# Patient Record
Sex: Female | Born: 1935 | Race: Black or African American | Hispanic: No | Smoking: Never smoker
Health system: Southern US, Community
[De-identification: ages and names within clinical notes are randomized; demographics above are authoritative.]

## PROBLEM LIST (undated history)

## (undated) DIAGNOSIS — M810 Age-related osteoporosis without current pathological fracture: Secondary | ICD-10-CM

## (undated) DIAGNOSIS — E785 Hyperlipidemia, unspecified: Secondary | ICD-10-CM

## (undated) DIAGNOSIS — K219 Gastro-esophageal reflux disease without esophagitis: Secondary | ICD-10-CM

## (undated) DIAGNOSIS — I509 Heart failure, unspecified: Secondary | ICD-10-CM

## (undated) DIAGNOSIS — I10 Essential (primary) hypertension: Secondary | ICD-10-CM

## (undated) DIAGNOSIS — I429 Cardiomyopathy, unspecified: Secondary | ICD-10-CM

## (undated) DIAGNOSIS — E1140 Type 2 diabetes mellitus with diabetic neuropathy, unspecified: Secondary | ICD-10-CM

## (undated) DIAGNOSIS — N189 Chronic kidney disease, unspecified: Secondary | ICD-10-CM

## (undated) DIAGNOSIS — E119 Type 2 diabetes mellitus without complications: Secondary | ICD-10-CM

## (undated) HISTORY — DX: Age-related osteoporosis without current pathological fracture: M81.0

## (undated) HISTORY — DX: Type 2 diabetes mellitus without complications: E11.9

## (undated) HISTORY — PX: EYE SURGERY: SHX253

## (undated) HISTORY — DX: Chronic kidney disease, unspecified: N18.9

## (undated) HISTORY — DX: Gastro-esophageal reflux disease without esophagitis: K21.9

## (undated) HISTORY — PX: BREAST LUMPECTOMY: SHX2

## (undated) HISTORY — DX: Cardiomyopathy, unspecified: I42.9

## (undated) HISTORY — DX: Essential (primary) hypertension: I10

## (undated) HISTORY — DX: Hyperlipidemia, unspecified: E78.5

## (undated) HISTORY — DX: Heart failure, unspecified: I50.9

## (undated) HISTORY — DX: Type 2 diabetes mellitus with diabetic neuropathy, unspecified: E11.40

---

## 1957-08-05 HISTORY — PX: NEPHRECTOMY: SHX65

## 1982-08-05 HISTORY — PX: BLADDER SURGERY: SHX569

## 2002-08-05 HISTORY — PX: COLOSTOMY: SHX63

## 2003-07-12 ENCOUNTER — Inpatient Hospital Stay (HOSPITAL_COMMUNITY): Admission: EM | Admit: 2003-07-12 | Discharge: 2003-07-20 | Payer: Self-pay | Admitting: Emergency Medicine

## 2003-07-13 ENCOUNTER — Encounter (INDEPENDENT_AMBULATORY_CARE_PROVIDER_SITE_OTHER): Payer: Self-pay

## 2003-10-25 ENCOUNTER — Encounter: Admission: RE | Admit: 2003-10-25 | Discharge: 2003-10-25 | Payer: Self-pay | Admitting: General Surgery

## 2003-11-10 ENCOUNTER — Inpatient Hospital Stay (HOSPITAL_COMMUNITY): Admission: RE | Admit: 2003-11-10 | Discharge: 2003-11-21 | Payer: Self-pay | Admitting: General Surgery

## 2003-11-10 ENCOUNTER — Encounter (INDEPENDENT_AMBULATORY_CARE_PROVIDER_SITE_OTHER): Payer: Self-pay | Admitting: Specialist

## 2004-03-08 ENCOUNTER — Other Ambulatory Visit: Admission: RE | Admit: 2004-03-08 | Discharge: 2004-03-08 | Payer: Self-pay | Admitting: Gynecology

## 2004-05-11 ENCOUNTER — Encounter (INDEPENDENT_AMBULATORY_CARE_PROVIDER_SITE_OTHER): Payer: Self-pay | Admitting: Diagnostic Radiology

## 2004-05-11 ENCOUNTER — Encounter: Admission: RE | Admit: 2004-05-11 | Discharge: 2004-05-11 | Payer: Self-pay | Admitting: Internal Medicine

## 2004-05-11 ENCOUNTER — Encounter (INDEPENDENT_AMBULATORY_CARE_PROVIDER_SITE_OTHER): Payer: Self-pay | Admitting: *Deleted

## 2004-05-11 ENCOUNTER — Encounter: Admission: RE | Admit: 2004-05-11 | Discharge: 2004-05-11 | Payer: Self-pay | Admitting: General Surgery

## 2004-05-16 ENCOUNTER — Encounter: Admission: RE | Admit: 2004-05-16 | Discharge: 2004-05-16 | Payer: Self-pay | Admitting: General Surgery

## 2004-05-31 ENCOUNTER — Encounter (INDEPENDENT_AMBULATORY_CARE_PROVIDER_SITE_OTHER): Payer: Self-pay | Admitting: *Deleted

## 2004-05-31 ENCOUNTER — Ambulatory Visit (HOSPITAL_COMMUNITY): Admission: RE | Admit: 2004-05-31 | Discharge: 2004-05-31 | Payer: Self-pay | Admitting: General Surgery

## 2004-05-31 ENCOUNTER — Encounter: Admission: RE | Admit: 2004-05-31 | Discharge: 2004-05-31 | Payer: Self-pay | Admitting: General Surgery

## 2004-05-31 ENCOUNTER — Ambulatory Visit (HOSPITAL_BASED_OUTPATIENT_CLINIC_OR_DEPARTMENT_OTHER): Admission: RE | Admit: 2004-05-31 | Discharge: 2004-05-31 | Payer: Self-pay | Admitting: General Surgery

## 2006-06-06 ENCOUNTER — Ambulatory Visit (HOSPITAL_BASED_OUTPATIENT_CLINIC_OR_DEPARTMENT_OTHER): Admission: RE | Admit: 2006-06-06 | Discharge: 2006-06-06 | Payer: Self-pay | Admitting: Urology

## 2006-11-18 ENCOUNTER — Ambulatory Visit (HOSPITAL_BASED_OUTPATIENT_CLINIC_OR_DEPARTMENT_OTHER): Admission: RE | Admit: 2006-11-18 | Discharge: 2006-11-18 | Payer: Self-pay | Admitting: Urology

## 2007-04-16 ENCOUNTER — Ambulatory Visit (HOSPITAL_BASED_OUTPATIENT_CLINIC_OR_DEPARTMENT_OTHER): Admission: RE | Admit: 2007-04-16 | Discharge: 2007-04-16 | Payer: Self-pay | Admitting: Urology

## 2007-05-01 ENCOUNTER — Emergency Department (HOSPITAL_COMMUNITY): Admission: EM | Admit: 2007-05-01 | Discharge: 2007-05-01 | Payer: Self-pay | Admitting: Emergency Medicine

## 2007-05-04 ENCOUNTER — Encounter (HOSPITAL_BASED_OUTPATIENT_CLINIC_OR_DEPARTMENT_OTHER): Admission: RE | Admit: 2007-05-04 | Discharge: 2007-05-21 | Payer: Self-pay | Admitting: Surgery

## 2007-09-17 ENCOUNTER — Ambulatory Visit (HOSPITAL_BASED_OUTPATIENT_CLINIC_OR_DEPARTMENT_OTHER): Admission: RE | Admit: 2007-09-17 | Discharge: 2007-09-17 | Payer: Self-pay | Admitting: Urology

## 2008-07-04 ENCOUNTER — Ambulatory Visit (HOSPITAL_COMMUNITY): Admission: RE | Admit: 2008-07-04 | Discharge: 2008-07-04 | Payer: Self-pay | Admitting: Ophthalmology

## 2008-07-04 ENCOUNTER — Encounter (INDEPENDENT_AMBULATORY_CARE_PROVIDER_SITE_OTHER): Payer: Self-pay | Admitting: Ophthalmology

## 2008-08-22 ENCOUNTER — Ambulatory Visit (HOSPITAL_COMMUNITY): Admission: RE | Admit: 2008-08-22 | Discharge: 2008-08-22 | Payer: Self-pay | Admitting: Ophthalmology

## 2008-12-21 ENCOUNTER — Encounter: Admission: RE | Admit: 2008-12-21 | Discharge: 2008-12-21 | Payer: Self-pay | Admitting: *Deleted

## 2010-11-19 LAB — CBC
HCT: 38.8 % (ref 36.0–46.0)
Hemoglobin: 12.9 g/dL (ref 12.0–15.0)
MCHC: 33.4 g/dL (ref 30.0–36.0)
MCV: 96.6 fL (ref 78.0–100.0)
Platelets: 268 10*3/uL (ref 150–400)
RBC: 4.02 MIL/uL (ref 3.87–5.11)
RDW: 13.8 % (ref 11.5–15.5)
WBC: 5.6 10*3/uL (ref 4.0–10.5)

## 2010-11-19 LAB — BASIC METABOLIC PANEL
BUN: 18 mg/dL (ref 6–23)
CO2: 28 mEq/L (ref 19–32)
Calcium: 8.6 mg/dL (ref 8.4–10.5)
Chloride: 105 mEq/L (ref 96–112)
Creatinine, Ser: 1.18 mg/dL (ref 0.4–1.2)
GFR calc Af Amer: 53 mL/min — ABNORMAL LOW (ref >60)
GFR calc non Af Amer: 44 mL/min — ABNORMAL LOW (ref >60)
Glucose, Bld: 130 mg/dL — ABNORMAL HIGH (ref 70–99)
Potassium: 4.1 mEq/L (ref 3.5–5.1)
Sodium: 140 mEq/L (ref 135–145)

## 2010-11-19 LAB — GLUCOSE, CAPILLARY
Glucose-Capillary: 108 mg/dL — ABNORMAL HIGH (ref 70–99)
Glucose-Capillary: 112 mg/dL — ABNORMAL HIGH (ref 70–99)
Glucose-Capillary: 133 mg/dL — ABNORMAL HIGH (ref 70–99)

## 2010-12-18 NOTE — Op Note (Signed)
NAMETRIVA, HUEBER            ACCOUNT NO.:  [id number]   MEDICAL RECORD NO.:  [id number]          PATIENT TYPE:  AMB   LOCATION:  [facility]                         FACILITY:  [facility]   PHYSICIAN:  Martina Sinner, MD DATE OF BIRTH:  05-04-1922   DATE OF PROCEDURE:  04/16/2007  DATE OF DISCHARGE:                               OPERATIVE REPORT   PREOPERATIVE DIAGNOSIS:  Stress incontinence.   POSTOPERATIVE DIAGNOSIS:  Stress incontinence.   Ms. Egge has recurrent stress incontinence that does well with  collagen.   The patient is prepped and draped in the usual fashion.  She is given  preoperative ciprofloxacin.  The ACMI scope was used for examination and  treatment.  The bladder mucosa and trigone were normal.  I injected at 5  and 7 o'clock and there was excellent coaptation with three syringes of  collagen.  The patient's bladder was emptied with a small red rubber  catheter.  Hopefully, this will reach her treatment goal.           ______________________________  Martina Sinner, MD  Electronically Signed     SAM/MEDQ  D:  04/16/2007  T:  04/16/2007  Job:  [id number]

## 2010-12-18 NOTE — Consult Note (Signed)
[name], [name]            ACCOUNT NO.:  [id number]   MEDICAL RECORD NO.:  [id number]          PATIENT TYPE:  REC   LOCATION:  FOOT                         FACILITY:  [facility]   PHYSICIAN:  Harold A. Tanda Rockers, M.D.DATE OF BIRTH:  Jun 20, 1922   DATE OF CONSULTATION:  05/06/2007  DATE OF DISCHARGE:                                 CONSULTATION   SUBJECTIVE:  The patient is an 77 year old lady who is referred by Dr.  Ignacia Palma from the [facility] Emergency Room for follow-up of a drug  reaction and ulcerations of both lower extremities.   IMPRESSION:  Drug reaction to Darvocet-N.   RECOMMENDATIONS:  Continue with compressive bandages and topical silver  gel to avoid secondary wound infection.  The patient will require serial  exams which we will proceed with in the wound clinic.   OBJECTIVE:  The patient is an 77 year old lady who was seen in the  emergency room four days ago for blisters on her lower extremities,  upper arms and proximal thighs associated with the p.o. consumption of  Darvocet-N.  The patient was debrided, cleansed treated with  antipruritics and referred to the wound center for follow-up.   PAST MEDICAL HISTORY:  Remarkable for  1. A 16-year history of diabetes.  2. She has also had breast carcinoma resected in 1992.  3. A nephrectomy on the right.   PRIMARY CARE PHYSICIAN:  Bertram Millard. Hyacinth Meeker, M.D.   CURRENT MEDICATIONS:  1. Glipizide 5 mg a day.  2. Triamterene/hydrochlorothiazide one every other day.  3. Trimethoprim 100 mg a day.  4. Toprol XL 25 mg a day.  5. Zoloft 50 mg a day.  6. Furosemide 60 mg a day.   ALLERGIES:  1. DARVOCET-N.  2. NIACIN.  3. ALL ARTHRITIS MEDICATIONS.   PAST SURGICAL HISTORY:  Her surgical procedures have included  1. Kidney stone removed from the left kidney in 1958.  2. Nephrectomy in 1958.  3. Left ureter reimplantation in 1984.  4. D&C in 1985.  5. Benign lumpectomies in 1987 and 1992.  6. Endometrial biopsies.  7. Colostomy in 2004.  8. Colostomy closure in 2005.  9. Malignant lumpectomy of the right breast in 2005.  10.Recent retinal surgery.   FAMILY HISTORY:  Positive for diabetes and hypertension.  Negative for  cancer and stroke.   SOCIAL HISTORY:  She is a widow.  She is a retired a Agricultural engineer.  She  has two children, one lives remotely and one lives locally.  She lives  with her daughter.   REVIEW OF SYSTEMS:  She has excellent exercise tolerance.  She has never  smoked.  Her weight has been stable.  Her diet is balanced.  She denies  transient blindness or paralysis or neurological impediments consistent  with TIAs.  There is no polydipsia or polyphagia.  There is no chronic  cough.  No chest pain.  Bowel and bladder function are stable.  She does  have occasional constipation.  There is no claudication.  The remainder  of the review of systems is negative.   PHYSICAL EXAMINATION:  GENERAL APPEARANCE:  She is  an alert, oriented,  pleasant female in no acute distress.  VITAL SIGNS:  Blood pressure is 154/72, respirations 18, pulse rate 65,  temperature 97.6.  She is accompanied by a daughter.  HEENT:  Clear.  NECK:  Supple.  Trachea is midline.  Thyroid is nonpalpable.  LUNGS:  Clear.  HEART:  Heart sounds are distant.  ABDOMEN:  Soft.  EXTREMITIES:  Exam is remarkable for bilateral 2+ edema with areas of  partial-thickness blister formation.  The majority of which have  ruptured and are in the process of re-epithelializing.  There is no  evidence of ascending infection or cellulitis.  The pedal pulse is  readily palpable bilaterally.  NEUROLOGIC:  There is preservation of protective sensation.   LABORATORY DATA:  Review of her laboratory shows a normal white count  including the eosinophil count.  There is mild ablation of the  creatinine at 1.38.   DISCUSSION:  This 77 year old female has been treated in the emergency  room for the drug reaction.  On today's exam, she has  residual edema but  the blisters have ruptured, some of which are currently open with some  moderate serous exudate.  We have dressed the wounds with a Profore  light dressing.  We have instructed the patient to leave these in place  for one week.  We have reinforced the dressing with a silver gel to act  as a bacteriocidal.  We have reinforced the diagnosis of probable drug  reaction due to her Darvocet-N.  She has had no recurrences following  this continuation of this medication.  We will reevaluate her in one  week.  If she experiences pain, additional blisters or fever, she is to  contact her primary care physician or contact the wound center for  instructions.  We have given the  daughter and the patient an opportunity to ask questions.  They seem to  understand the aforementioned discussion and instructions and indicate  that they will be compliant.  They expressed gratitude for having been  seen in the clinic.      Harold A. Tanda Rockers, M.D.  Electronically Signed     HAN/MEDQ  D:  05/06/2007  T:  05/06/2007  Job:  [id number]   cc:   Osvaldo Human, M.D.  Bertram Millard. Hyacinth Meeker, M.D.

## 2010-12-18 NOTE — Op Note (Signed)
NAME:  [name], [name]            ACCOUNT NO.:  [id number]   MEDICAL RECORD NO.:  [id number]          PATIENT TYPE:  AMB   LOCATION:  SDS                          FACILITY:  [facility]   PHYSICIAN:  Jillyn Hidden A. Rankin, M.D.   DATE OF BIRTH:  September 28, 1921   DATE OF PROCEDURE:  07/04/2008  DATE OF DISCHARGE:  07/04/2008                               OPERATIVE REPORT   PREOPERATIVE DIAGNOSES:  1. 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  2. Retained ocular implant - silicone oil - foreign body - nonmagnetic      left eye.   POSTOPERATIVE DIAGNOSES:  1. 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  2. Retained ocular implant - silicone oil - foreign body - nonmagnetic      left eye.  3. Extruding ocular explant - scleral buckle superonasal quadrant of      the left eye.  4. Conjunctival wound dehiscence in this area.   PROCEDURE:  1. Posterior vitrectomy with membrane peel 25 gauge for residual      peripheral mid vitreous with traction on the inferior detachment at      6 o'clock as well as superonasally with some of this vitreoretinal      detachment extending posterior to the equator causing a mid      peripheral contracture.  2. Removal of nonmagnetic foreign body - silicone oil left eye.  3. Removal of silicone subexposed scleral buckle - explant left eye.  4. Revision of operative wound with conjunctivoplasty left eye.   SURGEON:  Alford Highland. Rankin, MD   ANESTHESIA:  Local retrobulbar with anesthesia control.   COMPLICATIONS:  None.  The patient returned to the short stay area.   INDICATION FOR PROCEDURE:  The patient is an 77 year old woman who has  profound non-use of the vision in left eye with macular potential  function of the left eye.  The patient understands this an attempt to  remove the oil implant so as to allow for best visual function in her  left eye.  She understands the risks of anesthesia  including rare  occurrence of death, loss of the eye including but not limited to  hemorrhage, infection, scarring, need for further surgery, no change in  vision, loss of vision, and progressive disease despite intervention.   PROCEDURE IN DETAIL:  Appropriate signed consent was obtained.  The  patient was taken to the operating room.  In the operating room,  appropriate monitors followed by mild sedation.  5 mL of 2% Xylocaine  was injected with 5 mL of retrobulbar and additional 5 mL laterally in  fashion of modified Darel Hong.  The left periocular region was sterilely  prepped and draped in the usual sterile fashion.  A lid speculum was  applied.  A 25-gauge infusion cannula was placed in the inferotemporal  quadrant.  The superotemporal trocar was also applied.  Superonasal  inspection of the quadrant revealed that a prior sclerotomy site which  was exposed by a mild episcleral tissue and  apparent conjunctival  dehiscence.  Upon gentle elevation of this area it could be seen that a  direct view down to the solid silicone explant on the superonasal  quadrant with debris in this area as well as exposed suture upon  reflection without surgical cutting of the conjunctiva.  For this  reason, the decision was made to perform a conjunctival wound plasty.  Because of the exposed buckle and most likely having had the normal  bacterial deposition in this area, decision was made that irrigation  just alone was not appropriate.  For this reason, the buckle was  grasped, gently elevated and two what appeared to be white 5-0 Mersilene  mattress sutures in this area were cut.  This allowed for a gentle  sliding of the flat explant which appeared to be about 4 mm wide but  mostly flat to be slid from its scleral cheek and tunnel were probably  360 degrees without significant manipulation.  Now, this bed was  irrigated with bug juice and the remnants of 5-0 Mersilene were  identified and these were  removed as well.  The globe remained its  integrity.  At this time, the wound on the conjunctiva then freshened up  and the underneath reflection that had epithelialized was freshened up,  also was the scleral bed with a 0.1 Grieshaber blade freshened up the  scleral surface to remove the epithelial tissues in this area so there  will be fresh slightly bleeding surfaces that were inferior  postoperatively.  At this time, a superotemporal sclerotomy was  fashioned with a 20 gauge MVR blade in the future bed of the  conjunctival revision and advancement.  At this time, an 18-gauge  Silastic cannula with the extrusion set was then used to remove the  silicone oil.  What appeared to be oil was removed under direct  visualization with the BIOM, however there appeared be a pocket near and  posterior to the equator and overlying the nasal retina but was not  mobilized.  It was only when it was clear that this pocket of silicone  oil was captured and underneath a reflection of the posterior hyaloid  which was attached slightly posterior to the equator but the posterior  leaflet of the hyaloid kept this large silicone oil bubble in place in a  preretinal fashion.  For this reason, I was actually able to dissect the  vitreous reflection, this mobilized the silicone oil and this was  removed.  Thereafter, the vitreous reflections were then identified  nearly 360 degrees and these were trimmed and with radial incisions as  well so as to decrease the circumferential traction postoperatively.  A  localized traction detachment which had been noted inferiorly and hole  that have been shallowly opened now did open and this area was enlarged  to release the traction and to also allow evacuation of all subretinal  fluid in this area.  After completion of this portion of the procedure,  a fluid-air exchange was completed not only to remove the silicone oil  off the surface of the infused BSS but also to allow  reattachment of the  retina.  Retinal fluid at the 6 o'clock position was also aspirated with  a soft-tipped extrusion needle.  The retina reattached nicely in this  localized 1 o'clock area.  Laser photocoagulation was placed in this  area and around the edge of the break to keep the retina reattached.  Reaccumulated fluid was removed.  All preretinal fluid  was removed.  Thereafter, superonasal 20-gauge sclerotomy was then closed with 7-0  Vicryl.  An air - C3F8 8% exchange was then completed.  The  superotemporal trocar was removed.  The infusion removed.  The  conjunctiva was then brought forward and was closed with 7-0 Vicryl  suture in interrupted fashion so as to close the wound dehiscence.   No complications occurred.  At this time, subconjunctival Decadron was  applied.  Sterile patch and Fox shield were applied.  The patient  tolerated the procedure without complication.  She was taken to the  short-stay area and we did release her as an outpatient.      Alford Highland Rankin, M.D.  Electronically Signed     GAR/MEDQ  D:  07/04/2008  T:  07/05/2008  Job:  [id number]   cc:   [facility]

## 2010-12-18 NOTE — Assessment & Plan Note (Signed)
[facility]   NAME:  [name], [name]            ACCOUNT NO.:  [id number]   MEDICAL RECORD NO.:  [id number]      DATE OF BIRTH:  1922-02-08   PHYSICIAN:  Theresia Majors. Tanda Rockers, M.D. VISIT DATE:  05/12/2007                                   OFFICE VISIT   [name] is an 77 year old lady whom we are seeing for bilateral  fluid retention and a concurrent drug reaction to Darvocet-N, with  bullous and blister eruptations.  Since her last visit, we treated her  in a compression wrap and topical silver gel.  There has been no  excessive drainage, malodor, pain, or fever.   OBJECTIVE:  VITAL SIGNS:  Blood pressure is 133/56, respirations 20,  pulse rate 60, temperature is 98.1.  She is accompanied by a friend.  The capillary blood glucose is 107 mg percent.  EXTREMITIES:  Inspection of the lower extremity shows that the previous  blisters have all resolved.  There is residual proteinaceous exudate  from the blisters, but the skin is intact, and there is 100%  epithelialization.  There is no hyperemia.  The +2 bilateral edema is  persistent and, according to the patient, this is her usual habitus.  There is no evidence of ascending infection.  There is no evidence of  ischemia.   ASSESSMENT:  Resolved allergic reaction.   RECOMMENDATIONS:  We recommend that the patient resume her daily tub  baths.  This will allow for these a washing off of the proteinaceous  exudates.  We have also recommended that she resume wearing her  bilateral open-toe, below-the-knee compression hose.  We are discharging  her from active followup in the wound center.  We have given the patient  and her attendant an opportunity to ask questions.  They seem to  understand and express gratitude for having been seen in the clinic.      Harold A. Tanda Rockers, M.D.  Electronically Signed     HAN/MEDQ  D:  05/12/2007  T:  05/13/2007  Job:  [id number]   cc:   Dr. Hyacinth Meeker

## 2010-12-18 NOTE — Op Note (Signed)
NAME:  [name], [name]            ACCOUNT NO.:  [id number]   MEDICAL RECORD NO.:  [id number]          PATIENT TYPE:  AMB   LOCATION:  SDS                          FACILITY:  [facility]   PHYSICIAN:  Jillyn Hidden A. Rankin, M.D.   DATE OF BIRTH:  [birth year]   DATE OF PROCEDURE:  08/22/2008  DATE OF DISCHARGE:                               OPERATIVE REPORT   PREOPERATIVE DIAGNOSES:  1. 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  2. Proliferative vitreoretinopathy stage C4.   POSTOPERATIVE DIAGNOSES:  1. 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  2. Proliferative vitreoretinopathy stage C4.   PROCEDURES:  1. Posterior vitrectomy.  2. Membrane peel, epiretinal membranes with complex retinal detachment      and repair using pan retinal photocoagulation.  3. Retinectomy.  4. Temporary injection of vitreous substitute of Perfluoron as well as      permanent instillation of silicone oil 5000 Centistokes.   SURGEON:  Alford Highland. Rankin, MD   ANESTHESIA:  Local retrobulbar, monitored anesthesia control.   INDICATIONS FOR PROCEDURE:  The patient is an 77 year old woman who has  had removal of silicone oil in the last 4-5 weeks to allow best chance  of regaining visual functioning of the left eye.  She has, however,  developed recurrent retinal detachment.  She is found to have  proliferative vitreoretinopathy with star folds inferiorly in this left  eye.  She has 2 large retinal holes outside the areas of retinopexy,  which was done pre-surgical intervention.  She understands this is an  attempt to reattach the retina so as to keep this eye as a spare tire.  She understands the need for surgical intervention; otherwise, there  would be no chance of visual acuity or stabilization or improvement.  She understands the risk of anesthesia including rate occurrence of  death, but she also understands the risk of the eye including but not  limited to from the underlying condition as well surgical repair,  hemorrhage, infection, scarring, need for the surgery, no change in  vision, loss of vision, progression of disease in spite of intervention.   PROCEDURE IN DETAIL:  After appropriate signed consent was obtained, the  patient was taken to the operating room.  In the operating room,  appropriate monitors followed by mild sedation.  A 2% Xylocaine injected  5 mL, retrobulbar with additional 5 mL laterally in fashion of modified  Darel Hong.  The left periocular region was sterilely prepped and draped  in the usual ophthalmic fashion.  Lid speculum was applied.  A 25-gauge  trocar placed in the inferotemporal quadrant.  Infusion turned on.  Superotemporal trocar applied.  Superonasal trocar applied.  Vitrectomy  instruments were then inserted, 25-gauge, and notable findings.  Using  scleral depression at 6 o'clock between the 8-6-4 o'clock meridians,  there was evidence of proliferative vitreoretinopathy with a little bit  of early anterior loop traction in this area.  This was shaved down  using vitreous shave techniques.  The posterior extension what appeared  to be old vitreous  with now PVR surface of it, sent it back towards the  2 retinal holes located at the 6 o'clock position.  This was elevated  with forceps and then this vitreous was also shaved.  The rigid retina  between 6 and 7:30 position was encompassed in the 2 retinal holes of  this area, and I did not feel that this would have flatten appropriately  nor the appropriate areas of retinopexy to keep these areas flat.  For  this reason, these 2 holes were joined with the retinectomy fashion and  the rigid retina was excised out from about 8:30 o'clock, so these would  flat nicely with fluid-air exchange.  The posterior retinotomy was  fashioned just along the inferotemporal arcade outside the macular  region.  This allowed for fluid-fluid exchange.  Fluid-fluid  exchange  was carried out.  Thereafter, fluid-air exchange was carried out.  Fluid-  air exchange allowed the retina to flat nicely.  There was still some  accumulation of fluid what appeared to be in the central region.  For  this reason after attempts at removal of the subretinal fluid using a 20-  gauge enlarged sclerotomy superonasally, decision was made to place a  small amount of Perfluoron over the macula to confirm that all fluid of  the submacular region was removed.  This did in fact allow removal of  all submacular fluid.  Thereafter, the Perfluoron was removed.  Thereafter under air, the retina was nicely attached.  Under air, laser  retinopexy was then applied peripherally 360 degrees as well as around  the retinotomy sites in the previous retinectomy placed inferiorly.  Retina remained nicely attached.  Superotemporal sclerotomy conjunctiva  was then opened and closed with 7-0 Vicryl.  Thereafter, the air was  exchanged from the silicone oil passively via the supranasal injection  of silicone oil passively and the superonasal sclerotomy closed with 7-0  Vicryl.  The infusion was then removed and similarly it was closed with  7-0 Vicryl as well after opening the conjunctiva.  Now, the conjunctiva  was all closed with 7-0 Vicryl.  The subconjunctival Decadron applied.  Sterile patch and Fox shield applied.  Intraocular pressure was assessed  and found to be adequate.  A small amount of air was left just behind  the implant so as to prevent overfill of the oil.   No complications occurred.  Sterile patch and Fox shield applied.  The  patient was taken to the PACU in good and stable condition.      Alford Highland Rankin, M.D.  Electronically Signed     GAR/MEDQ  D:  08/22/2008  T:  08/23/2008  Job:  [id number]

## 2010-12-18 NOTE — Op Note (Signed)
NAMEREILLY, MOLCHAN            ACCOUNT NO.:  [id number]   MEDICAL RECORD NO.:  [id number]          PATIENT TYPE:  AMB   LOCATION:  [facility]                         FACILITY:  [facility]   PHYSICIAN:  Martina Sinner, MD DATE OF BIRTH:  12-13-1921   DATE OF PROCEDURE:  09/17/2007  DATE OF DISCHARGE:                               OPERATIVE REPORT   PREOPERATIVE DIAGNOSES:  Mixed stress and urge incontinence   POSTOPERATIVE DIAGNOSES:  Mixed stress and urge incontinence.   SURGERY:  Cystoscopy, transurethral collagen injection therapy.   Ms. Baltz has mixed stress urge incontinence.  Her overactive bladder  symptoms actually respond favorably of collagen.   Today she was here for her fourth treatment.  The patient is prepped and  draped in the usual fashion.  The ACMI scope was used for the treatment.  Bladder mucosa and trigone were normal.  There is no stitch, foreign  body or carcinoma.  I injected three syringes of collagen at 5 and 7  o'clock and at 6 o'clock.  The coaptation was excellent.  A small red  rubber catheter drained the bladder.  I was very pleased with the  technical aspects of the procedure and hopefully it reaches her  treatment goal.           ______________________________  Martina Sinner, MD  Electronically Signed     SAM/MEDQ  D:  09/17/2007  T:  09/18/2007  Job:  [id number]

## 2010-12-21 NOTE — Op Note (Signed)
NAMELAURELL, [name]            ACCOUNT NO.:  [id number]   MEDICAL RECORD NO.:  [id number]          PATIENT TYPE:  AMB   LOCATION:  [facility]C                         FACILITY:  [facility]   PHYSICIAN:  Martina Sinner, MD DATE OF BIRTH:  06-15-1922   DATE OF PROCEDURE:  11/18/2006  DATE OF DISCHARGE:                               OPERATIVE REPORT   PREOPERATIVE DIAGNOSIS:  Stress urinary incontinence.   POSTOPERATIVE DIAGNOSIS:  Stress urinary incontinence.   SURGERY:  Cystoscopy, transurethral collagen injection therapy.   [name] has mixed stress and urge incontinence.  She responds  very well to collagen.   The patient was prepped and draped in the usual fashion.  The collagen  scope was used for examination.  The bladder mucosa and trigone were  normal.  There is no stitch, foreign body or carcinoma.  She had a long  healthy urethra.   Using the collagen scope, I injected two syringes of collagen at 5  o'clock.  There was excellent coaptation of the entire urethra.  I was  very pleased with the technical aspects of the procedure.  A red rubber  catheter was inserted to empty her bladder.  The patient was cover  preoperatively with antibiotics.  She was sent to recovery room.           ______________________________  Martina Sinner, MD  Electronically Signed     SAM/MEDQ  D:  11/18/2006  T:  11/18/2006  Job:  [id number]

## 2010-12-21 NOTE — Op Note (Signed)
[name], [name]                      ACCOUNT NO.:  [id number]   MEDICAL RECORD NO.:  [id number]                   PATIENT TYPE:  INP   LOCATION:  0353                                 FACILITY:  [facility]   PHYSICIAN:  Anselm Pancoast. Zachery Dakins, M.D.          DATE OF BIRTH:  03-Aug-1922   DATE OF PROCEDURE:  11/10/2003  DATE OF DISCHARGE:                                 OPERATIVE REPORT   PREOPERATIVE DIAGNOSES:  Status post perforated sigmoid diverticulitis with  end-colostomy and Hartmann.   OPERATION:  Take down of colostomy, sigmoid colectomy, and low anastomosis  hand-sewn pelvis.   ANESTHESIA:  General.   SURGEON:  Dr. Consuello Bossier.   ASSISTANT:  Dr. Kendrick Ranch.   HISTORY:  [name] is a 77 year old,  Caucasian female, who, in  December, had a perforated sigmoid diverticulitis and had emergency surgery  with a colostomy and Hartmann procedure, mild diabetic that is controlled  with oral medication, and she had an erythromycin and neomycin bowel prep in  preparation for surgery.   DESCRIPTION OF PROCEDURE:  The patient was taken to the operative suite.  She has PAS stockings.  Foley catheter was inserted sterilely.  She was  given 3 g of Unasyn and after induction of general anesthesia, placed a  stitch around the colostomy.  The abdomen was then prepped with Betadine  surgical scrub and solution and draped in a sterile manner.  A midline  incision was opened, and I did extend the incision about 2 inches above this  so I could get into the free area, and then the omentum that was adherent to  the undersurface of the small bowel was kind of scrubbed away, then lightly  dissected.  The patient's small bowel was a little bit gaseous, and this was  freed from the adhesions in the pelvis, and we went ahead and started  mobilizing the descending colon since it was going to be necessary to  completely free that, and she has a big splenic flexure segment up by the  spleen.  With the colostomy and her body habitus, it was such that it would  be easier if we go ahead and free up the colostomy, so I ellipsed the skin  around where we had placed the pursestring and then freed up the colon,  slipped it back to the inside, and then we had a Thompson retractor to kind  of assist in the exposure of the left upper quadrant.  With this, now we  could go ahead and mobilize the splenic flexure, and it was necessary to  free up a few little splenorenal ligaments to mobilize this, and this  redundant about 6 inches of splenic flexure right beside the colon, right  beside the spleen could be freed and after the posterior attachments were  divided, they would then slide down.  I then divided the mesentery distally  so that we could take basically right at the bottom of  the descending colon  and bring that down to the upper rectum.  The distal portion had been  identified.  The Prolene suture that I had placed was visualized.  She still  has her uterus and fallopian tubes, and we worked under these.  In the area,  were some fairly dense adhesions where the colon had been closed with a  stapler.  I removed probably about 1.5 inches of the distal sigmoid since  that there were a couple of tics on that in the barium enema and then took  it down to the mesentery.  The little vessels were sutured or ligated with  fine silk sutures and then used a Satinsky over the colon and then half of  the two ends were mobilized and brought together and then actually removed  the segment distally and also the more proximal sigmoid colon.  The little  mesentery where I had freed it up could get good blood supply from the  marginal artery, and the branches going from the inferior mesenteric artery  had to be divided since they would not allow it to reach to the pelvis.  These were ligated with 2-0 silk.  Next, with the small bowel packed away  and held in place with a malleable retractor,  did a single layer of 3-0  silk, knots-inverted, anastomosis with about a two-finger lumen opening.  It  goes together snug but not tight and then after the small bowel was removed,  then she was kind of put more in a head-up position, and this definitely  mobile.  The little mesenteric defect was closed with interrupted sutures of  2-0 silk.  Small bowel was placed in the normal anatomic position after  irrigating and inspecting the anastomosis.  We then carefully inspected  upward.  We had mobilized the spleen and found no evidence of any  significant bleeding later there, and sponge count was correct.  The  colostomy was closed with an 0 PDS with an inner layer and then 0 Prolene  for the fascia, and then the midline was closed with interrupted sutures of  0 Prolene.  We had extended the incision probably nearly twice as long as  the original incision had been for working in the pelvis and also mobilizing  the splenic flexure area.  The patient tolerated the procedure well.  The  skin had been closed with staples, and we will make the decision in the  recovery room whether she is going to the ICU or can possibly be on the  floor probably with telemetry.                                               Anselm Pancoast. Zachery Dakins, M.D.    WJW/MEDQ  D:  11/10/2003  T:  11/10/2003  Job:  [id number]   cc:   Marylu Lund L. Avis Epley, M.D.  [address]  [place]  [place] [address]  Fax: [phone]

## 2010-12-21 NOTE — Discharge Summary (Signed)
[name], [name]                      ACCOUNT NO.:  [id number]   MEDICAL RECORD NO.:  [id number]                   PATIENT TYPE:  INP   LOCATION:  0462                                 FACILITY:  [facility]   PHYSICIAN:  Anselm Pancoast. Zachery Dakins, M.D.          DATE OF BIRTH:  11-Aug-1921   DATE OF ADMISSION:  07/12/2003  DATE OF DISCHARGE:                                 DISCHARGE SUMMARY   DISCHARGE DIAGNOSES:  1. Perforated sigmoid diverticulitis with generalized peritonitis.  2. History of depression.   OPERATION:  Sigmoid colectomy with Luz Brazen and end colostomy; general  anesthesia.   HISTORY:  [name] is a 77 year old Caucasian female who was  admitted through the emergency room after she had about three days of  progressive abdominal pain.  It was much more intense for the last 24 hours  and when she was seen in the emergency room she was definitely generally  tender throughout her abdomen.  Her temperature was not initially elevated  but on laboratory studies the white count was 16,500 with hematocrit of  36.9.  The patient was seen by Dr. Lynelle Doctor and a CT was ordered, and this  showed a small amount of free air throughout the peritoneal cavity. On  questioning the patient denied ever having problems with diverticulitis but  she did have some irregularity and kind of a moderate sluggish probably  secondary to her antidepressant medications.  I was asked to see her, placed  her on Unasyn, and permission was obtained to proceed on with an emergency  laparotomy.  I was called late in the evening and we got to the operating  room about close to midnight and on opening the abdomen she definitely did  have a kind of an early peritonitis throughout the abdomen.  The loop of  perforated sigmoid colon was the distal sigmoid.  It was kind of more over  on the right because of the redundancy of the colon and this area was  resected, the end closed, and an end colostomy brought up  in the left lower  quadrant.  I continued her on the Unasyn and added Flagyl for approximately  two days.  The predominant organism was E. coli.  She had a nasogastric tube  and has kind of gradually improved.  When she was able to start p.o.  medication we resumed her antidepressant medications and she appears to,  perhaps, function satisfactory.  Her colostomy is now working.  She has been  advanced to a regular diet.  Her Foley is out.  There was a peritoneal drain  that has been removed, and she is ready for discharge in improved condition  at this time.  She will have the colostomy taken down probably about four  months and she is ready to be discharged on her chronic medications; they  are:  1. Zoloft 50 mg daily.  2. Glipizide 5 mg a day for mild diabetes.  3.  Triamterene - she takes that essentially daily.  4. Benazepril hydrochloride 10 mg a day.   She will have some Vicodin and Darvocet which she can use for pain as needed  and was encouraged to use a little Milk of Magnesia if she needed for gas.  She has been seen by the stoma therapist and home training - well actually,  [facility] will see her for a couple of visits for the next couple  of weeks to assist in her colostomy  management.  She is discharged in improved condition and will see me in the  office next week.  I have removed every-other staple and will remove the  remaining staples when she is seen in the office.  Her pathology report did  show a perforated sigmoid diverticulitis with acute inflammation.                                               Anselm Pancoast. Zachery Dakins, M.D.    WJW/MEDQ  D:  07/20/2003  T:  07/20/2003  Job:  [id number]   cc:   Marylu Lund L. Avis Epley, M.D.  [address]  [place]  [place] [address]  Fax: [phone]

## 2010-12-21 NOTE — Op Note (Signed)
[name], [name]            ACCOUNT NO.:  [id number]   MEDICAL RECORD NO.:  [id number]          PATIENT TYPE:  AMB   LOCATION:  NESC                         FACILITY:  [facility]   PHYSICIAN:  Martina Sinner, MD DATE OF BIRTH:  Dec 06, 1921   DATE OF PROCEDURE:  06/06/2006  DATE OF DISCHARGE:  06/06/2006                                 OPERATIVE REPORT   PREOPERATIVE DIAGNOSIS:  Stress incontinence.   POSTOPERATIVE DIAGNOSIS:  Stress incontinence.   PROCEDURE:  Cystoscopy plus transurethral collagen injection therapy.   HISTORY:  [name] has stress incontinence with complicating bladder  dysfunction.   DESCRIPTION OF PROCEDURE:  The patient was prepped and draped in usual  fashion under MAC anesthesia.  She was given preoperative antibiotics.   Collagen scope was utilized.  Bladder mucosa and trigone were normal. There  was no evidence of foreign body or carcinoma.  I injected collagen at 5 and  7 o'clock and there was excellent coaptation.  Technically the procedure  went very well.  Hopefully this will improve her quality of life.  She will  be followed as per protocol.           ______________________________  Martina Sinner, MD  Electronically Signed     SAM/MEDQ  D:  06/13/2006  T:  06/13/2006  Job:  [id number]

## 2010-12-21 NOTE — H&P (Signed)
NAME:  [name], [name]                      ACCOUNT NO.:  [id number]   MEDICAL RECORD NO.:  [id number]                   PATIENT TYPE:  INP   LOCATION:  0101                                 FACILITY:  [facility]   PHYSICIAN:  Anselm Pancoast. Zachery Dakins, M.D.          DATE OF BIRTH:  07/17/1922   DATE OF ADMISSION:  07/12/2003  DATE OF DISCHARGE:                                HISTORY & PHYSICAL   CHIEF COMPLAINT:  Abdominal pain.   HISTORY:  [name] is a 77 year old widowed Caucasian female who  lives with her daughter who was brought to the emergency room this evening  about 7 p.m. with approximately a 48-hour history of progressive abdominal  pain.  The patient states that when the pain first started she was not  really able to localize where she was hurt, whether it was upper or lower.  It was not really all in the lower abdomen.  The pain became much worse the  past 24 hours and then she was getting tender throughout her abdomen and  presented to the emergency room for an examination by the ER physician.  He  was more impressed that she was tender in the lower right abdomen and  laboratory studies showed an elevated white count of about 16,000.  The  patient has a history of depression.  She also has a history of non-insulin  elevated glucose diabetic who is on oral medication and the ER physician  ordered a CT.  I was checking the ER in preparation going home and they  asked me to see her and she was definitely tender throughout her abdomen,  not real localized, but more tender in the right lower quadrant.  The CT  contrast had been completed and I waited where we could do the CT and it  showed free air, not enormous large quantities, but definite free air and  what appears to be an area of diverticulitis in the distal sigmoid right  above the rectum.  The patient had never had a previous problem with  diverticulitis and I talked with the daughter and recommended that she be  admitted, intravenous antibiotics, and she will need surgery for this  perforated colon.  The patient is in agreement.   CHRONIC MEDICATIONS:  1. Glucophage.  2. Glipizide.  3. Zoloft.  4. Triamterene.  5. Benazepril hydrochloride 10 mg daily.   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   PAST OBSTETRICAL HISTORY:  She has had two children, twins, a boy and a girl  and her daughter is with her tonight.   SOCIAL HISTORY:  She does not smoke cigarettes and denies previous pulmonary  problems.  The patient states she moved back to [place] to be living with  her daughter approximately eight months ago.  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   PAST MEDICAL HISTORY:  She does have hypertension and history of mild  depression.   PHYSICAL EXAMINATION:  GENERAL:  The patient is an elderly white female, not  extremely anxious, but definitely a tender abdomen in all quadrants.  VITAL SIGNS:  At 6:00 when she arrived her temperature was 98.5.  At 10:00-  10:30 her temperature was 101, blood pressure 126/59, pulse 90, respirations  20.  I think she weighs about 172 pounds.  HEENT:  She appears adequately hydrated.  NECK:  No cervical or supraclavicular lymphadenopathy.  LUNGS:  Good breath sounds bilaterally.  CARDIAC:  Normal sinus rhythm.  ABDOMEN:  Decreased to absent bowel sounds.  Definitely tender in all  quadrants, more so in the right upper quadrant.  She has a right transverse  incision where she has had a nephrectomy on the right.  No evidence of any  hernia.  RECTAL:  She has a small amount of stool in the rectum.  EXTREMITIES:  I do not appreciate any pedal edema.  She appears to have  adequate peripheral circulation.  CNS:  Grossly normal.   ADMISSION IMPRESSION:  Perforated sigmoid diverticulitis with generalized   peritonitis.   PLAN:  The patient will be started on intravenous antibiotics.  Plan  exploratory laparotomy and I have discussed with her about the need of a  colostomy which will hopefully be temporary.  The patient is in agreement  with this.  Gave her 3 g of Unasyn and scheduled for OR soon.                                               Anselm Pancoast. Zachery Dakins, M.D.    WJW/MEDQ  D:  07/13/2003  T:  07/13/2003  Job:  [id number]

## 2010-12-21 NOTE — Op Note (Signed)
[name], [name]            ACCOUNT NO.:  [id number]   MEDICAL RECORD NO.:  [id number]          PATIENT TYPE:  AMB   LOCATION:  DSC                          FACILITY:  [facility]   PHYSICIAN:  Anselm Pancoast. Weatherly, M.D.DATE OF BIRTH:  September 03, 1921   DATE OF PROCEDURE:  05/31/2004  DATE OF DISCHARGE:                                 OPERATIVE REPORT   PREOPERATIVE DIAGNOSIS:  Small carcinoma, right breast.   POSTOPERATIVE DIAGNOSIS:  Small carcinoma, right breast.   OPERATION PERFORMED:  Lumpectomy, needle localized.   SURGEON:  Anselm Pancoast. Zachery Dakins, M.D.   ANESTHESIA:  General.   INDICATIONS FOR PROCEDURE:  [name] is an 77 year old Caucasian  female who I have recently performed a sigmoid colectomy for perforated  sigmoid diverticulitis and then colostomy take down and on her recent  routine mammography, an area was identified in the right breast that was  questionable for a carcinoma. This was a very small area and I had it needle  localized.  Dr. 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  The extent of the tumor was thought  to be about 0.5 cm maximum and the patient in follow-up has had an MRI that  showed no other areas.  There is now question whether it is 6 or 7 mm in  size and she is for needle localization excisional biopsy at this time.  She  was presented at the breast conference and because of the small size and  pathology it was thought that a sentinel node was not needed but just a  lumpectomy, wide excision and probably no radiation therapy.  The patient is  for the planned procedure at this time.  She has had a wire placed this  morning by Dr. Manson Passey and comes in ready for the planned procedure.  The  lesion is in the right upper quadrant, more lateral nearly in the axilla and  the wire was placed very posterior.   DESCRIPTION OF PROCEDURE:  I prepped the breast with Betadine surgical scrub  and  solution.  After draping her in a sterile manner after induction of  anesthesia with an LOA tube and then a kind of curved incision was made over  where I think the mass is but anterior to where the wire comes out.  First  dissected and identified the wire, brought it back into the wound and then  removed and area probably about 3 cm size going around the wire down to  underlying pectoral fascia.  Several blood vessels were clamped and sutured  with 4-0 Vicryl and good hemostasis obtained.  The specimen I could not feel  the lesion.  It was then x-rayed and confirmed to be excised and will be  sent for final path and margins.  The wound itself was closed in two layers  with 4-0 Vicryl in the deeper layer after irrigating and good hemostasis and  then a 4-0 Monocryl and benzoin and Steri-Strips on the skin.  The patient  tolerated the procedure nicely and will be released after a short stay in  the recovery room.  I will see her back in the office about the middle of  next week and she understands the pathology will be completed early next  week.     WJW/MEDQ  D:  05/31/2004  T:  05/31/2004  Job:  [id number]

## 2010-12-21 NOTE — H&P (Signed)
[name], [name]                      ACCOUNT NO.:  [id number]   MEDICAL RECORD NO.:  [id number]                   PATIENT TYPE:  INP   LOCATION:  0353                                 FACILITY:  [facility]   PHYSICIAN:  Anselm Pancoast. Zachery Dakins, M.D.          DATE OF BIRTH:  12/18/1921   DATE OF ADMISSION:  11/10/2003  DATE OF DISCHARGE:                                HISTORY & PHYSICAL   POSTOPERATIVE DIAGNOSIS:  Colostomy, status post perforated diverticulitis.   PLAN:  Colostomy take-down.   HISTORY:  [name] is a 77 year old Caucasian female who was  admitted through the emergency room here on December 7th when she had about  a 24-hour history of progressive, severe lower abdominal pain.  CT has  showed a perforation.  She had never had previous problems with sigmoid  diverticulitis but had a perforated sigmoid tic with generalized  peritonitis.  An exploratory laparotomy, sigmoid short colectomy and end-  colostomy Gertie Gowda was performed.  She had perforated kind of low in the  sigmoid colon.  At the time of surgery, I thought that she had a pretty  normal-appearing more proximal colon; however, after she had recovered, we  did a barium enema that showed extensive diverticulosis with numerous tics  in the sigmoid colon but basically no significant tics once the descending  colon is reached.  She is for colostomy take-down.  We plan on removing the  more proximal sigmoid colon and doing an anastomosis in the pelvis.  She has  had erythromycin, Neomycin, and GoLYTELY bowel prep in preparation for  surgery.   PAST MEDICAL HISTORY:  She is on Zoloft twice daily.  She is widowed.  Used  to live at [place] and moved here with the daughter, I think about two  years ago.  She has a mildly elevated glucose, type 2, that is managed with  diet.  She has a history of hypertension.   She is on glipizide for her diabetes and benazepril HCL 10 mg a day for  blood pressure.   She denies angina or other cardiac symptoms.  She has had a previous kidney  operation on the right and basically is independent living status, living  with her daughter.   Please refer to the past medical and recent history for details.   PHYSICAL EXAMINATION:  VITAL SIGNS:  This morning, temperature 97, pulse 76,  respirations 16, blood pressure 140/56.  She is 5 feet 3 and weighs 170  pounds.  GENERAL:  She is an elderly, quite talkative female in no acute distress.  HEENT:  Well-hydrated.  NECK:  No cervical or supraclavicular lymphadenopathy.  LUNGS:  Good breath sounds bilaterally.  HEART:  Normal sinus rhythm.  BREASTS:  Unremarkable.  ABDOMEN:  There is a lower midline incision.  Nontender abdomen.  A  colostomy in the left lower quadrant.  She still has her uterus and female  organs, but they were not palpable  on physical exam previously.  EXTREMITIES:  No pedal edema.  Good peripheral pulses.  CNS:  Physiologic.   ADMISSION IMPRESSION:  1. Status post perforated sigmoid diverticulitis with a colostomy and     Hartmann; therefore, colostomy take-down with removal of the remaining     sigmoid colon and a low pelvic anastomosis.  2. History of mild hypertension.  3. History of type 2 diabetes, controlled with diet and oral medication.                                               Anselm Pancoast. Zachery Dakins, M.D.    WJW/MEDQ  D:  11/10/2003  T:  11/11/2003  Job:  [id number]

## 2010-12-21 NOTE — Op Note (Signed)
NAME:  [name], [name]                      ACCOUNT NO.:  [id number]   MEDICAL RECORD NO.:  [id number]                   PATIENT TYPE:  INP   LOCATION:  0101                                 FACILITY:  [facility]   PHYSICIAN:  Anselm Pancoast. Zachery Dakins, M.D.          DATE OF BIRTH:  12/17/1921   DATE OF PROCEDURE:  07/13/2003  DATE OF DISCHARGE:                                 OPERATIVE REPORT   PREOPERATIVE DIAGNOSIS:  Perforated sigmoid diverticulitis.   POSTOPERATIVE DIAGNOSIS:  Perforated sigmoid diverticulitis.   OPERATION:  1. Exploratory laparotomy.  2. Limited sigmoid colectomy, Hartmann, and end-colostomy.   ANESTHESIA:  General.   SURGEON:  Anselm Pancoast. Zachery Dakins, M.D.   ASSISTANT:  Nurse.   HISTORY:  [name] is a 77 year old Caucasian female who was  admitted through the emergency room this evening after she presented with  approximately a 36-hour history of aggressive generalized abdominal pain in  the right lower quadrant.  She has never had a previous problem with  diverticulitis by history but does have a history of depression and is a  mild diabetic on oral medication.  CT was performed, which showed free air,  and you could see what appears to be a loop of diverticulitis down in the  distal sigmoid colon.  The patient was started on 3 g of Unasyn and  permission obtained for exploratory laparotomy, and she understands that she  will be getting a temporary colostomy.   The patient was positioned on the OR table, induction of general anesthesia,  endotracheal tube.  An oral tube was placed into the stomach.  This was  later changed to an NG tube.  A Foley catheter was inserted sterilely and  then the abdomen was prepped with Betadine surgical scrub and solution and  draped in a sterile manner.  A small incision was mad just below the  umbilicus.  Her small bowel and colon were gaseous, and we carefully entered  the peritoneal cavity.  The sigmoid colon, she has  chronic adhesions down in  the lower abdomen, and we could mobilize the adhesions and free up the  omentum so I could visualize the sigmoid colon.  In the pelvic area more to  the right of midline was definitely free purulence with peritonitis.  The  appendix was normal, and I could feel an area of diverticulitis that  involved about two inches of the sigmoid colon.  I divided the colon just  proximal to this and the mesentery vessels were clamped with right angles  and Kellys and ligated with 2-0 Vicryl.  She has very poor tissue turgor and  as far as trying to tie these sutures, some of them required suturing for  better hemostasis.  We went down and saw an area of probably about four  inches of the colon to be removed, and then I used a TA-60 with regular  staples to go under it, fired it, and then transected  it, removing this part  of the diseased colon.  I opened it on the back field.  You could see a  single tic in the area of diverticulitis where she is perforated.  There is  soft stool in the __________.  I then redirected my attention back to the  wound, changing gloves, and the mesenteric areas, carefully sutured any  areas in question because of the kind of friability and poor tissue turgor  for hemostasis.  I then used an 0 Prolene to kind of mark the end of the  proximal rectum to put her back together and then it was necessary to  mobilize the omentum a little bit to bring this fistula up to the anterior  abdominal wall.  I made a two-finger opening through the left rectus below  the umbilicus and then brought this area in the proximal sigmoid up through  the area.  Three sutures of 2-0 Vicryl were placed and sutured going to the  peritoneum and posterior rectus fascia and then small bowel was in anatomic  position.  We had thoroughly irrigated with probably 4 or 5 L of saline.  I  had cultured the purulence initially and all this fluid was aspirated.  I  did place a drain in  the pelvis and also brought the omentum down out of  this area.  I then closed the lower abdominal about six-inch incision with  figure-of-eight sutures of 0 Prolene and the skin closed with skin staples.  Next I removed the suture line and two of the proximal sutures of 4-0 Vicryl  ___________ stool initially.  A sterile colostomy bag was placed around the  stoma and ___________.  She has a nasogastric tube, taking the Unasyn, and  will follow her for the next few days with Unasyn unless the cultures show  another drug.  Her sugar was not elevated preoperatively, and we will follow  this postoperatively.  ___________.  Sponge and needle counts were correct.  Estimated blood loss was about 300 mL.  The mesentery ___________.  Vital  signs were stable.  ___________.  Sponge and needle counts were correct x2.                                               Anselm Pancoast. Zachery Dakins, M.D.    WJW/MEDQ  D:  07/13/2003  T:  07/13/2003  Job:  [id number]   cc:   Marylu Lund L. Avis Epley, M.D.  [address]  [place]  [place] [address]  Fax: [phone]

## 2010-12-21 NOTE — Discharge Summary (Signed)
Ashley, [name]                      ACCOUNT NO.:  [id number]   MEDICAL RECORD NO.:  [id number]                   PATIENT TYPE:  INP   LOCATION:  0353                                 FACILITY:  [facility]   PHYSICIAN:  Anselm Pancoast. Zachery Dakins, M.D.          DATE OF BIRTH:  Jan 14, 1922   DATE OF ADMISSION:  11/10/2003  DATE OF DISCHARGE:  11/21/2003                                 DISCHARGE SUMMARY   DISCHARGE DIAGNOSES:  1. Diverticulosis, sigmoid colon, status post perforated diverticulitis,     sigmoid colon with colostomy and Hartmann's procedure.  2. Mild diabetes mellitus.  3. History of depression.   OPERATION:  Take-down of sigmoid colostomy, sigmoid colectomy, and low  anterior anastomosis.   ANESTHESIA:  General.   SURGEON:  Anselm Pancoast. Zachery Dakins, M.D.   HISTORY:  [name] is a 77 year old Caucasian female who was  admitted through the emergency room on December 7th with a 24-hour history  of progressive lower abdominal pain.  CT was performed, which showed a  perforation.  She had no previous problems of sigmoid diverticulitis but  required an exploratory laparotomy.  Was noted to have a perforated sigmoid  colon and a sigmoid colectomy, Hartmann, and end-colostomy was performed.  The patient has a history of depression and is on Zoloft.  She also has mild  elevated sugar and is followed predominantly by diet plus an oral diabetic  medication.  She is on Glyburide for her diabetes, and Benazepril HCL 10 mg  for mildly elevated blood pressure.  Patient did well following the surgery.  Was discharged.  Now approximately 3-1/2 months later, is for reversal.  We  have had a barium enema that showed extensive diverticulosis in the  remaining sigmoid colon, and it will be necessary to resect this and  mobilize the splenic flexure.  The patient had erythromycin, neomycin, and  GoLYTELY bowel prep in preparation for surgery.   On the morning of surgery, her lab  studies showed a normal white count with  a hematocrit of 42.  The patient was taken to surgery.  Dr. Earlene Plater assisted.  The colostomy was taken down.  We did resect the sigmoid colon, but it was  necessary to mobilize the splenic flexure in order to bring the proximal  colon down to the low rectum, where the perforation had occurred.  This was  performed with a hand-sewn anastomosis, low pelvic.  Postoperatively, she  had a nasogastric tube.  It was about five days before she started passing a  little bit of flatus.  I was somewhat concerned, as she was having a fairly  large amount of NG drainage, but we started clamping the NG tube off and on.  She was taking ice chips.  The drainage greatly decreased.  She gained a few  pounds of weight with the IV fluids, and I gave her occasional Lasix to  mobilize the fluid.  She did start having passing of  flatus, and the NG tube  was removed.  We kept her n.p.o. for approximately 24 hours and then started  her on a clear-liquid diet.  We resumed the Zoloft that she is taking  chronically, as she was getting a little bit anxious, but this resolved  quickly.  At no time was she having any significant shortness of breath or  signs of intra-abdominal problems.  We advanced her on up to a full-liquid  diet.  She was having soft bowel movements, and she was ready for discharge  to resume her chronic medications on the 18th.  Plus, she will also take  about 2 tablespoons of milk of magnesia every other day for a stool  softener.  She is also on Vicodin q.4h. p.r.n. pain.  Is instructed to see  me in the office in approximately 4-5 days.  I have removed every other  staple and will get the remaining staples out in the office.  Her incisions  were healing nicely.  The path report did show sigmoid diverticulosis, as  was expected.                                               Anselm Pancoast. Zachery Dakins, M.D.    WJW/MEDQ  D:  12/08/2003  T:  12/08/2003  Job:   [id number]   cc:   Marylu Lund L. Avis Epley, M.D.  [address]  [place]  [place] [address]  Fax: [phone]

## 2011-04-26 LAB — I-STAT 8, (EC8 V) (CONVERTED LAB)
BUN: 23
Bicarbonate: 25.5 — ABNORMAL HIGH
Chloride: 108
Glucose, Bld: 113 — ABNORMAL HIGH
HCT: 44
Hemoglobin: 15
Operator id: 268271
Potassium: 4.3
Sodium: 139
TCO2: 27
pCO2, Ven: 42.8 — ABNORMAL LOW
pH, Ven: 7.382 — ABNORMAL HIGH

## 2011-05-07 LAB — BASIC METABOLIC PANEL
BUN: 27 — ABNORMAL HIGH
CO2: 24
Calcium: 9
Chloride: 109
Creatinine, Ser: 1.37 — ABNORMAL HIGH
GFR calc Af Amer: 44 — ABNORMAL LOW
GFR calc non Af Amer: 37 — ABNORMAL LOW
Glucose, Bld: 119 — ABNORMAL HIGH
Potassium: 4.4
Sodium: 140

## 2011-05-07 LAB — CBC
HCT: 39.6
Hemoglobin: 13.4
MCHC: 33.9
MCV: 96.5
Platelets: 277
RBC: 4.1
RDW: 13.3
WBC: 6.9

## 2011-05-07 LAB — GLUCOSE, CAPILLARY
Glucose-Capillary: 107 — ABNORMAL HIGH
Glucose-Capillary: 118 — ABNORMAL HIGH

## 2011-05-16 LAB — BASIC METABOLIC PANEL
BUN: 17
Calcium: 9
Creatinine, Ser: 1.38 — ABNORMAL HIGH
GFR calc Af Amer: 44 — ABNORMAL LOW

## 2011-05-16 LAB — CBC
Platelets: 355
RBC: 3.79 — ABNORMAL LOW
WBC: 5.3

## 2011-05-16 LAB — URINE MICROSCOPIC-ADD ON

## 2011-05-16 LAB — URINALYSIS, ROUTINE W REFLEX MICROSCOPIC
Glucose, UA: NEGATIVE
Hgb urine dipstick: NEGATIVE
Protein, ur: NEGATIVE
Specific Gravity, Urine: 1.014
Urobilinogen, UA: 0.2

## 2011-05-16 LAB — DIFFERENTIAL
Basophils Relative: 0
Lymphs Abs: 1.5
Monocytes Relative: 5
Neutro Abs: 3.4
Neutrophils Relative %: 64

## 2011-05-16 LAB — B-NATRIURETIC PEPTIDE (CONVERTED LAB): Pro B Natriuretic peptide (BNP): 175 — ABNORMAL HIGH

## 2011-05-17 LAB — POCT I-STAT 4, (NA,K, GLUC, HGB,HCT)
Glucose, Bld: 96
Operator id: 268271

## 2011-07-02 ENCOUNTER — Other Ambulatory Visit (HOSPITAL_COMMUNITY): Payer: Self-pay | Admitting: Internal Medicine

## 2011-07-02 DIAGNOSIS — N6019 Diffuse cystic mastopathy of unspecified breast: Secondary | ICD-10-CM

## 2011-07-02 DIAGNOSIS — Z1231 Encounter for screening mammogram for malignant neoplasm of breast: Secondary | ICD-10-CM

## 2011-07-02 DIAGNOSIS — Z13820 Encounter for screening for osteoporosis: Secondary | ICD-10-CM

## 2011-07-02 DIAGNOSIS — I10 Essential (primary) hypertension: Secondary | ICD-10-CM

## 2011-07-05 ENCOUNTER — Ambulatory Visit (HOSPITAL_COMMUNITY)
Admission: RE | Admit: 2011-07-05 | Discharge: 2011-07-05 | Disposition: A | Discharge status: Home / Self Care | Payer: Medicare Other | Source: Ambulatory Visit | Attending: Internal Medicine | Admitting: Internal Medicine

## 2011-07-05 DIAGNOSIS — Z1231 Encounter for screening mammogram for malignant neoplasm of breast: Secondary | ICD-10-CM

## 2011-07-05 DIAGNOSIS — I517 Cardiomegaly: Secondary | ICD-10-CM | POA: Insufficient documentation

## 2011-07-05 DIAGNOSIS — Z13820 Encounter for screening for osteoporosis: Secondary | ICD-10-CM

## 2011-07-05 DIAGNOSIS — I10 Essential (primary) hypertension: Secondary | ICD-10-CM

## 2011-07-05 DIAGNOSIS — Z0000 Encounter for general adult medical examination without abnormal findings: Secondary | ICD-10-CM | POA: Insufficient documentation

## 2011-08-07 ENCOUNTER — Ambulatory Visit (HOSPITAL_COMMUNITY)
Admission: RE | Admit: 2011-08-07 | Discharge: 2011-08-07 | Disposition: A | Discharge status: Home / Self Care | Payer: Medicare Other | Source: Ambulatory Visit | Attending: Internal Medicine | Admitting: Internal Medicine

## 2011-08-07 DIAGNOSIS — Z1231 Encounter for screening mammogram for malignant neoplasm of breast: Secondary | ICD-10-CM | POA: Insufficient documentation

## 2011-08-07 DIAGNOSIS — N6019 Diffuse cystic mastopathy of unspecified breast: Secondary | ICD-10-CM

## 2011-08-16 ENCOUNTER — Other Ambulatory Visit: Payer: Self-pay | Admitting: Family Medicine

## 2011-08-16 DIAGNOSIS — R928 Other abnormal and inconclusive findings on diagnostic imaging of breast: Secondary | ICD-10-CM

## 2011-08-27 ENCOUNTER — Ambulatory Visit
Admission: RE | Admit: 2011-08-27 | Discharge: 2011-08-27 | Disposition: A | Discharge status: Home / Self Care | Payer: Medicare Other | Source: Ambulatory Visit | Attending: Family Medicine | Admitting: Family Medicine

## 2011-08-27 DIAGNOSIS — R928 Other abnormal and inconclusive findings on diagnostic imaging of breast: Secondary | ICD-10-CM

## 2013-08-10 ENCOUNTER — Ambulatory Visit (INDEPENDENT_AMBULATORY_CARE_PROVIDER_SITE_OTHER): Payer: Medicare Other | Admitting: Podiatry

## 2013-08-10 ENCOUNTER — Encounter: Payer: Self-pay | Admitting: Podiatry

## 2013-08-10 VITALS — BP 135/73 | HR 97 | Resp 20

## 2013-08-10 DIAGNOSIS — M79609 Pain in unspecified limb: Secondary | ICD-10-CM

## 2013-08-10 DIAGNOSIS — B351 Tinea unguium: Secondary | ICD-10-CM

## 2013-08-10 NOTE — Progress Notes (Signed)
She presents today with a chief complaint of painful toenails one through 5 bilateral.  Objective: Vital signs are stable she is alert and oriented x3. Pulses are strongly palpable bilateral. Nails are thick yellow dystrophic clinically mycotic and painful palpation.  Assessment: Pain in limb secondary to onychomycosis 1 through 5 bilateral.  Plan: Debridement of nails 1 through 5 bilateral is cover service secondary to pain.

## 2013-08-23 DIAGNOSIS — E1140 Type 2 diabetes mellitus with diabetic neuropathy, unspecified: Secondary | ICD-10-CM | POA: Insufficient documentation

## 2013-08-23 DIAGNOSIS — E1122 Type 2 diabetes mellitus with diabetic chronic kidney disease: Secondary | ICD-10-CM | POA: Insufficient documentation

## 2013-08-23 DIAGNOSIS — M810 Age-related osteoporosis without current pathological fracture: Secondary | ICD-10-CM | POA: Insufficient documentation

## 2013-08-23 DIAGNOSIS — I10 Essential (primary) hypertension: Secondary | ICD-10-CM | POA: Insufficient documentation

## 2013-08-23 DIAGNOSIS — E785 Hyperlipidemia, unspecified: Secondary | ICD-10-CM | POA: Insufficient documentation

## 2013-08-23 DIAGNOSIS — N183 Chronic kidney disease, stage 3 (moderate): Secondary | ICD-10-CM

## 2013-08-23 DIAGNOSIS — K219 Gastro-esophageal reflux disease without esophagitis: Secondary | ICD-10-CM | POA: Insufficient documentation

## 2013-08-24 ENCOUNTER — Other Ambulatory Visit: Payer: Self-pay | Admitting: Internal Medicine

## 2013-08-24 ENCOUNTER — Ambulatory Visit (INDEPENDENT_AMBULATORY_CARE_PROVIDER_SITE_OTHER): Payer: Medicare Other | Admitting: Internal Medicine

## 2013-08-24 ENCOUNTER — Encounter: Payer: Self-pay | Admitting: Internal Medicine

## 2013-08-24 VITALS — BP 136/80 | HR 88 | Temp 98.8°F | Resp 18 | Wt 164.6 lb

## 2013-08-24 DIAGNOSIS — M79609 Pain in unspecified limb: Secondary | ICD-10-CM

## 2013-08-24 DIAGNOSIS — E559 Vitamin D deficiency, unspecified: Secondary | ICD-10-CM

## 2013-08-24 DIAGNOSIS — E119 Type 2 diabetes mellitus without complications: Secondary | ICD-10-CM

## 2013-08-24 DIAGNOSIS — E782 Mixed hyperlipidemia: Secondary | ICD-10-CM

## 2013-08-24 DIAGNOSIS — I10 Essential (primary) hypertension: Secondary | ICD-10-CM

## 2013-08-24 DIAGNOSIS — Z79899 Other long term (current) drug therapy: Secondary | ICD-10-CM

## 2013-08-24 DIAGNOSIS — N3000 Acute cystitis without hematuria: Secondary | ICD-10-CM

## 2013-08-24 LAB — BASIC METABOLIC PANEL WITH GFR
BUN: 29 mg/dL — ABNORMAL HIGH (ref 6–23)
CO2: 25 mEq/L (ref 19–32)
Calcium: 9.1 mg/dL (ref 8.4–10.5)
Chloride: 103 mEq/L (ref 96–112)
Creat: 1.13 mg/dL — ABNORMAL HIGH (ref 0.50–1.10)
GFR, EST AFRICAN AMERICAN: 50 mL/min — AB
GFR, EST NON AFRICAN AMERICAN: 43 mL/min — AB
Glucose, Bld: 109 mg/dL — ABNORMAL HIGH (ref 70–99)
POTASSIUM: 4.5 meq/L (ref 3.5–5.3)
SODIUM: 140 meq/L (ref 135–145)

## 2013-08-24 LAB — CBC WITH DIFFERENTIAL/PLATELET
BASOS ABS: 0 10*3/uL (ref 0.0–0.1)
BASOS PCT: 0 % (ref 0–1)
Eosinophils Absolute: 0.1 10*3/uL (ref 0.0–0.7)
Eosinophils Relative: 2 % (ref 0–5)
HCT: 44.6 % (ref 36.0–46.0)
HEMOGLOBIN: 14.7 g/dL (ref 12.0–15.0)
Lymphocytes Relative: 18 % (ref 12–46)
Lymphs Abs: 1.1 10*3/uL (ref 0.7–4.0)
MCH: 31.5 pg (ref 26.0–34.0)
MCHC: 33 g/dL (ref 30.0–36.0)
MCV: 95.5 fL (ref 78.0–100.0)
Monocytes Absolute: 0.4 10*3/uL (ref 0.1–1.0)
Monocytes Relative: 6 % (ref 3–12)
NEUTROS ABS: 4.6 10*3/uL (ref 1.7–7.7)
NEUTROS PCT: 74 % (ref 43–77)
Platelets: 254 10*3/uL (ref 150–400)
RBC: 4.67 MIL/uL (ref 3.87–5.11)
RDW: 14.9 % (ref 11.5–15.5)
WBC: 6.3 10*3/uL (ref 4.0–10.5)

## 2013-08-24 LAB — HEPATIC FUNCTION PANEL
ALT: 17 U/L (ref 0–35)
AST: 20 U/L (ref 0–37)
Albumin: 4.1 g/dL (ref 3.5–5.2)
Alkaline Phosphatase: 51 U/L (ref 39–117)
BILIRUBIN DIRECT: 0.2 mg/dL (ref 0.0–0.3)
BILIRUBIN INDIRECT: 0.6 mg/dL (ref 0.0–0.9)
Total Bilirubin: 0.8 mg/dL (ref 0.3–1.2)
Total Protein: 6.4 g/dL (ref 6.0–8.3)

## 2013-08-24 LAB — LIPID PANEL
CHOL/HDL RATIO: 3.3 ratio
CHOLESTEROL: 164 mg/dL (ref 0–200)
HDL: 50 mg/dL (ref >39)
LDL Cholesterol: 95 mg/dL (ref 0–99)
TRIGLYCERIDES: 96 mg/dL (ref <150)
VLDL: 19 mg/dL (ref 0–40)

## 2013-08-24 LAB — MAGNESIUM: Magnesium: 2 mg/dL (ref 1.5–2.5)

## 2013-08-24 LAB — CK: Total CK: 47 U/L (ref 7–177)

## 2013-08-24 NOTE — Patient Instructions (Addendum)
Try Lyrica 50 mg at bedtime for pain   and   May increase to 75 mg at bedtime   Hypertension As your heart beats, it forces blood through your arteries. This force is your blood pressure. If the pressure is too high, it is called hypertension (HTN) or high blood pressure. HTN is dangerous because you may have it and not know it. High blood pressure may mean that your heart has to work harder to pump blood. Your arteries may be narrow or stiff. The extra work puts you at risk for heart disease, stroke, and other problems.  Blood pressure consists of two numbers, a higher number over a lower, 110/72, for example. It is stated as "110 over 72." The ideal is below 120 for the top number (systolic) and under 80 for the bottom (diastolic). Write down your blood pressure today. You should pay close attention to your blood pressure if you have certain conditions such as:  Heart failure.  Prior heart attack.  Diabetes  Chronic kidney disease.  Prior stroke.  Multiple risk factors for heart disease. To see if you have HTN, your blood pressure should be measured while you are seated with your arm held at the level of the heart. It should be measured at least twice. A one-time elevated blood pressure reading (especially in the Emergency Department) does not mean that you need treatment. There may be conditions in which the blood pressure is different between your right and left arms. It is important to see your caregiver soon for a recheck. Most people have essential hypertension which means that there is not a specific cause. This type of high blood pressure may be lowered by changing lifestyle factors such as:  Stress.  Smoking.  Lack of exercise.  Excessive weight.  Drug/tobacco/alcohol use.  Eating less salt. Most people do not have symptoms from high blood pressure until it has caused damage to the body. Effective treatment can often prevent, delay or reduce that damage. TREATMENT   When a cause has been identified, treatment for high blood pressure is directed at the cause. There are a large number of medications to treat HTN. These fall into several categories, and your caregiver will help you select the medicines that are best for you. Medications may have side effects. You should review side effects with your caregiver. If your blood pressure stays high after you have made lifestyle changes or started on medicines,   Your medication(s) may need to be changed.  Other problems may need to be addressed.  Be certain you understand your prescriptions, and know how and when to take your medicine.  Be sure to follow up with your caregiver within the time frame advised (usually within two weeks) to have your blood pressure rechecked and to review your medications.  If you are taking more than one medicine to lower your blood pressure, make sure you know how and at what times they should be taken. Taking two medicines at the same time can result in blood pressure that is too low. SEEK IMMEDIATE MEDICAL CARE IF:  You develop a severe headache, blurred or changing vision, or confusion.  You have unusual weakness or numbness, or a faint feeling.  You have severe chest or abdominal pain, vomiting, or breathing problems. MAKE SURE YOU:   Understand these instructions.  Will watch your condition.  Will get help right away if you are not doing well or get worse.   Diabetes and Exercise Exercising regularly is important.  It is not just about losing weight. It has many health benefits, such as:  Improving your overall fitness, flexibility, and endurance.  Increasing your bone density.  Helping with weight control.  Decreasing your body fat.  Increasing your muscle strength.  Reducing stress and tension.  Improving your overall health. People with diabetes who exercise gain additional benefits because exercise:  Reduces appetite.  Improves the body's use of  blood sugar (glucose).  Helps lower or control blood glucose.  Decreases blood pressure.  Helps control blood lipids (such as cholesterol and triglycerides).  Improves the body's use of the hormone insulin by:  Increasing the body's insulin sensitivity.  Reducing the body's insulin needs.  Decreases the risk for heart disease because exercising:  Lowers cholesterol and triglycerides levels.  Increases the levels of good cholesterol (such as high-density lipoproteins [HDL]) in the body.  Lowers blood glucose levels. YOUR ACTIVITY PLAN  Choose an activity that you enjoy and set realistic goals. Your health care provider or diabetes educator can help you make an activity plan that works for you. You can break activities into 2 or 3 sessions throughout the day. Doing so is as good as one long session. Exercise ideas include:  Taking the dog for a walk.  Taking the stairs instead of the elevator.  Dancing to your favorite song.  Doing your favorite exercise with a friend. RECOMMENDATIONS FOR EXERCISING WITH TYPE 1 OR TYPE 2 DIABETES   Check your blood glucose before exercising. If blood glucose levels are greater than 240 mg/dL, check for urine ketones. Do not exercise if ketones are present.  Avoid injecting insulin into areas of the body that are going to be exercised. For example, avoid injecting insulin into:  The arms when playing tennis.  The legs when jogging.  Keep a record of:  Food intake before and after you exercise.  Expected peak times of insulin action.  Blood glucose levels before and after you exercise.  The type and amount of exercise you have done.  Review your records with your health care provider. Your health care provider will help you to develop guidelines for adjusting food intake and insulin amounts before and after exercising.  If you take insulin or oral hypoglycemic agents, watch for signs and symptoms of hypoglycemia. They  include:  Dizziness.  Shaking.  Sweating.  Chills.  Confusion.  Drink plenty of water while you exercise to prevent dehydration or heat stroke. Body water is lost during exercise and must be replaced.  Talk to your health care provider before starting an exercise program to make sure it is safe for you. Remember, almost any type of activity is better than none.    Cholesterol Cholesterol is a white, waxy, fat-like protein needed by your body in small amounts. The liver makes all the cholesterol you need. It is carried from the liver by the blood through the blood vessels. Deposits (plaque) may build up on blood vessel walls. This makes the arteries narrower and stiffer. Plaque increases the risk for heart attack and stroke. You cannot feel your cholesterol level even if it is very high. The only way to know is by a blood test to check your lipid (fats) levels. Once you know your cholesterol levels, you should keep a record of the test results. Work with your caregiver to to keep your levels in the desired range. WHAT THE RESULTS MEAN:  Total cholesterol is a rough measure of all the cholesterol in your blood.  LDL  is the so-called bad cholesterol. This is the type that deposits cholesterol in the walls of the arteries. You want this level to be low.  HDL is the good cholesterol because it cleans the arteries and carries the LDL away. You want this level to be high.  Triglycerides are fat that the body can either burn for energy or store. High levels are closely linked to heart disease. DESIRED LEVELS:  Total cholesterol below 200.  LDL below 100 for people at risk, below 70 for very high risk.  HDL above 50 is good, above 60 is best.  Triglycerides below 150. HOW TO LOWER YOUR CHOLESTEROL:  Diet.  Choose fish or white meat chicken and [place], roasted or baked. Limit fatty cuts of red meat, fried foods, and processed meats, such as sausage and lunch meat.  Eat lots of  fresh fruits and vegetables. Choose whole grains, beans, pasta, potatoes and cereals.  Use only small amounts of olive, corn or canola oils. Avoid butter, mayonnaise, shortening or palm kernel oils. Avoid foods with trans-fats.  Use skim/nonfat milk and low-fat/nonfat yogurt and cheeses. Avoid whole milk, cream, ice cream, egg yolks and cheeses. Healthy desserts include angel food cake, ginger snaps, animal crackers, hard candy, popsicles, and low-fat/nonfat frozen yogurt. Avoid pastries, cakes, pies and cookies.  Exercise.  A regular program helps decrease LDL and raises HDL.  Helps with weight control.  Do things that increase your activity level like gardening, walking, or taking the stairs.  Medication.  May be prescribed by your caregiver to help lowering cholesterol and the risk for heart disease.  You may need medicine even if your levels are normal if you have several risk factors. HOME CARE INSTRUCTIONS   Follow your diet and exercise programs as suggested by your caregiver.  Take medications as directed.  Have blood work done when your caregiver feels it is necessary. MAKE SURE YOU:   Understand these instructions.  Will watch your condition.  Will get help right away if you are not doing well or get worse.      Vitamin D Deficiency Vitamin D is an important vitamin that your body needs. Having too little of it in your body is called a deficiency. A very bad deficiency can make your bones soft and can cause a condition called rickets.  Vitamin D is important to your body for different reasons, such as:   It helps your body absorb 2 minerals called calcium and phosphorus.  It helps make your bones healthy.  It may prevent some diseases, such as diabetes and multiple sclerosis.  It helps your muscles and heart. You can get vitamin D in several ways. It is a natural part of some foods. The vitamin is also added to some dairy products and cereals. Some people  take vitamin D supplements. Also, your body makes vitamin D when you are in the sun. It changes the sun's rays into a form of the vitamin that your body can use. CAUSES   Not eating enough foods that contain vitamin D.  Not getting enough sunlight.  Having certain digestive system diseases that make it hard to absorb vitamin D. These diseases include Crohn's disease, chronic pancreatitis, and cystic fibrosis.  Having a surgery in which part of the stomach or small intestine is removed.  Being obese. Fat cells pull vitamin D out of your blood. That means that obese people may not have enough vitamin D left in their blood and in other body tissues.  Having chronic kidney or liver disease. RISK FACTORS Risk factors are things that make you more likely to develop a vitamin D deficiency. They include:  Being older.  Not being able to get outside very much.  Living in a nursing home.  Having had broken bones.  Having weak or thin bones (osteoporosis).  Having a disease or condition that changes how your body absorbs vitamin D.  Having dark skin.  Some medicines such as seizure medicines or steroids.  Being overweight or obese. SYMPTOMS Mild cases of vitamin D deficiency may not have any symptoms. If you have a very bad case, symptoms may include:  Bone pain.  Muscle pain.  Falling often.  Broken bones caused by a minor injury, due to osteoporosis. DIAGNOSIS A blood test is the best way to tell if you have a vitamin D deficiency. TREATMENT Vitamin D deficiency can be treated in different ways. Treatment for vitamin D deficiency depends on what is causing it. Options include:  Taking vitamin D supplements.  Taking a calcium supplement. Your caregiver will suggest what dose is best for you. HOME CARE INSTRUCTIONS  Take any supplements that your caregiver prescribes. Follow the directions carefully. Take only the suggested amount.  Have your blood tested 2 months after  you start taking supplements.  Eat foods that contain vitamin D. Healthy choices include:  Fortified dairy products, cereals, or juices. Fortified means vitamin D has been added to the food. Check the label on the package to be sure.  Fatty fish like salmon or trout.  Eggs.  Oysters.  Do not use a tanning bed.  Keep your weight at a healthy level. Lose weight if you need to.  Keep all follow-up appointments. Your caregiver will need to perform blood tests to make sure your vitamin D deficiency is going away. SEEK MEDICAL CARE IF:  You have any questions about your treatment.  You continue to have symptoms of vitamin D deficiency.  You have nausea or vomiting.  You are constipated.  You feel confused.  You have severe abdominal or back pain. MAKE SURE YOU:  Understand these instructions.  Will watch your condition.  Will get help right away if you are not doing well or get worse.

## 2013-08-24 NOTE — Progress Notes (Signed)
Patient ID: [name], female   DOB: [birth year], 80 y.o.   MRN: [id number]   This very nice 80 y.o. WWF presents for 3 month follow up with Hypertension, Hyperlipidemia, Pre-Diabetes and Vitamin D Deficiency.    HTN predates since 691990. BP has been controlled at home. Today's BP: 136/80 mmHg. In Oct 2014 BMET found BUN/Creat 30/1.18 and calc GFR 41 in stage III or moderately severe renal impairmentPatient denies any cardiac type chest pain, palpitations, /orthopnea/PND, dizziness, claudication, or dependent edema but she doe use a walker and has DOE.   Hyperlipidemia is controlled with diet & meds. Last Cholesterol was206, Triglycerides were  111, HDL 58 and LDL 126 - not at goal. Patient denies myalgias or other med SE's.    Also, the patient has history of Diabetes since 1990 with last A1c of 6.6% in Oct 2014. S. Patient denies any symptoms of reactive hypoglycemia, diabetic polys, paresthesias or visual blurring.   Further, Patient has history of Vitamin D Deficiency of 11 in 2012 with last vitamin D still low at 2942 in Oct 2014. Patient supplements vitamin D without any suspected side-effects.    Medication List       cetirizine 10 MG tablet  Commonly known as:  ZYRTEC  Take 10 mg by mouth daily.     FISH OIL PO  Take by mouth daily.     GINGER PO  Take by mouth 3 (three) times daily.     GINSENG PO  Take by mouth daily.     MAGNESIUM PO  Take by mouth daily.     OVER THE COUNTER MEDICATION  3 (three) times daily. Tumeric     OVER THE COUNTER MEDICATION  Flora sinus     OVER THE COUNTER MEDICATION  Herbavision-Lutin-Bilberry     PROBIOTIC DAILY PO  Take by mouth.     sertraline 100 MG tablet  Commonly known as:  ZOLOFT  Take 100 mg by mouth daily. Takes 1/2 daily     triamterene-hydrochlorothiazide 37.5-25 MG per capsule  Commonly known as:  DYAZIDE  Take 1 capsule by mouth daily. Three times weekly     TYLENOL 325 MG tablet  Generic drug:  acetaminophen   Take 650 mg by mouth every 6 (six) hours as needed.     VITAMIN D PO  Take 4,000 Int'l Units by mouth daily.     VITAMIN K PO  Take by mouth daily.     VITAMIN-B COMPLEX PO  Take by mouth daily.         Allergies  Allergen Reactions  . Ace Inhibitors   . Codeine     dysuria  . Lisinopril Swelling  . Morphine And Related     dysuria  . Percocet [Oxycodone-Acetaminophen]     dysuria  . Vitamin C     dysuria  . Vitamin D Analogs     Large doses cause dysuria    PMHx:   Past Medical History  Diagnosis Date  . Hyperlipidemia   . Hypertension   . Type II or unspecified type diabetes mellitus without mention of complication, not stated as uncontrolled   . Diabetic neuropathy   . Chronic kidney disease   . GERD (gastroesophageal reflux disease)   . Osteoporosis     FHx:    Reviewed / unchanged  SHx:    Reviewed / unchanged  Systems Review: Constitutional: Denies fever, chills, wt changes, headaches, insomnia, fatigue, night sweats, change in appetite. Eyes: Denies redness, blurred  vision, diplopia, discharge, itchy, watery eyes.  ENT: Denies discharge, congestion, post nasal drip, epistaxis, sore throat, earache, hearing loss, dental pain, tinnitus, vertigo, sinus pain, snoring.  CV: Denies chest pain, palpitations, irregular heartbeat, syncope, dyspnea, diaphoresis, orthopnea, PND, claudication, edema. Respiratory: denies cough, dyspnea, DOE, pleurisy, hoarseness, laryngitis, wheezing.  Gastrointestinal: Denies dysphagia, odynophagia, heartburn, reflux, water brash, abdominal pain or cramps, nausea, vomiting, bloating, diarrhea, constipation, hematemesis, melena, hematochezia,  or hemorrhoids. Genitourinary: Denies dysuria, frequency, urgency, nocturia, hesitancy, discharge, hematuria, flank pain. Musculoskeletal: Denies arthralgias, myalgias, stiffness, jt. swelling, pain, limp, strain/sprain.  Skin: Denies pruritus, rash, hives, warts, acne, eczema, change in skin  lesion(s). Neuro: No weakness, tremor, incoordination, spasms, paresthesia, or pain. Psychiatric: Denies confusion, memory loss, or sensory loss. Endo: Denies change in weight, skin, hair change.  Heme/Lymph: No excessive bleeding, bruising, orenlarged lymph nodes.  Filed Vitals:   08/24/13 1526  BP: 136/80  Pulse: 88  Temp: 98.8 F (37.1 C)  Resp: 18    On Exam:  Appears well nourished - in no distress. Eyes: PERRLA, EOMs, conjunctiva no swelling or erythema. Sinuses: No frontal/maxillary tenderness ENT/Mouth: EAC's clear, TM's nl w/o erythema, bulging. Nares clear w/o erythema, swelling, exudates. Oropharynx clear without erythema or exudates. Oral hygiene is good. Tongue normal, non obstructing. Hearing intact.  Neck: Supple. Thyroid nl. Car 2+/2+ without bruits, nodes or JVD. Chest: Respirations nl with BS clear & equal w/o rales, rhonchi, wheezing or stridor.  Cor: Heart sounds normal w/ regular rate and rhythm without sig. murmurs, gallops, clicks, or rubs. Peripheral pulses normal and equal  without edema.  Abdomen: Soft & bowel sounds normal. Non-tender w/o guarding, rebound, hernias, masses, or organomegaly.  Lymphatics: Unremarkable.  Musculoskeletal: Full ROM all peripheral extremities, joint stability, 4/5 strength, and uses wheelchair to stabilize gait.  Skin: Warm, dry without exposed rashes, lesions, ecchymosis apparent.  Neuro: Cranial nerves intact, reflexes equal bilaterally. Sensory-motor testing grossly intact. Tendon reflexes grossly intact.  Pysch: Alert & oriented x 3. Insight and judgement nl & appropriate. No ideations.  Assessment and Plan:  1. Hypertension - Continue monitor blood pressure at home. Continue diet/meds same.  2. Hyperlipidemia - Continue diet/meds, exercise,& lifestyle modifications. Continue monitor periodic cholesterol/liver & renal functions   3. Diabetes - continue recommend prudent low glycemic diet, weight control, regular exercise,  diabetic monitoring and periodic eye exams.  4. Vitamin D Deficiency - Continue supplementation.  Recommended regular exercise, BP monitoring, weight control, and discussed med and SE's. Recommended labs to assess and monitor clinical status. Further disposition pending results of labs.

## 2013-08-25 LAB — URINALYSIS, MICROSCOPIC ONLY
Casts: NONE SEEN
Crystals: NONE SEEN
Squamous Epithelial / LPF: NONE SEEN
WBC, UA: >50 WBC/hpf — AB (ref <3)

## 2013-08-25 LAB — VITAMIN D 25 HYDROXY (VIT D DEFICIENCY, FRACTURES): VIT D 25 HYDROXY: 56 ng/mL (ref 30–89)

## 2013-08-25 LAB — TSH: TSH: 3.299 u[IU]/mL (ref 0.350–4.500)

## 2013-08-25 LAB — HEMOGLOBIN A1C
Hgb A1c MFr Bld: 6.6 % — ABNORMAL HIGH (ref <5.7)
MEAN PLASMA GLUCOSE: 143 mg/dL — AB (ref <117)

## 2013-08-25 LAB — INSULIN, FASTING: INSULIN FASTING, SERUM: 8 u[IU]/mL (ref 3–28)

## 2013-08-26 LAB — URINE CULTURE: Colony Count: >=100000

## 2013-08-27 ENCOUNTER — Other Ambulatory Visit: Payer: Self-pay | Admitting: Internal Medicine

## 2013-08-27 DIAGNOSIS — I10 Essential (primary) hypertension: Secondary | ICD-10-CM

## 2013-08-27 DIAGNOSIS — Z79899 Other long term (current) drug therapy: Secondary | ICD-10-CM

## 2013-08-27 DIAGNOSIS — N3000 Acute cystitis without hematuria: Secondary | ICD-10-CM

## 2013-08-27 MED ORDER — CIPROFLOXACIN HCL 250 MG PO TABS: 250.0000 mg | ORAL_TABLET | Freq: Two times a day (BID) | ORAL | Status: AC

## 2013-08-30 ENCOUNTER — Telehealth: Payer: Self-pay | Admitting: Internal Medicine

## 2013-08-30 NOTE — Telephone Encounter (Signed)
Son called. Requested diabetic diet for patient. Will pick-up tomorrow.

## 2013-10-01 ENCOUNTER — Ambulatory Visit: Payer: Self-pay

## 2013-10-05 ENCOUNTER — Ambulatory Visit (INDEPENDENT_AMBULATORY_CARE_PROVIDER_SITE_OTHER): Payer: Medicare Other

## 2013-10-05 ENCOUNTER — Encounter (INDEPENDENT_AMBULATORY_CARE_PROVIDER_SITE_OTHER): Payer: Self-pay

## 2013-10-05 VITALS — Ht 63.0 in

## 2013-10-05 DIAGNOSIS — Z79899 Other long term (current) drug therapy: Secondary | ICD-10-CM

## 2013-10-05 DIAGNOSIS — N3000 Acute cystitis without hematuria: Secondary | ICD-10-CM

## 2013-10-05 DIAGNOSIS — I10 Essential (primary) hypertension: Secondary | ICD-10-CM

## 2013-10-05 LAB — BASIC METABOLIC PANEL
BUN: 22 mg/dL (ref 6–23)
CALCIUM: 9.1 mg/dL (ref 8.4–10.5)
CO2: 25 meq/L (ref 19–32)
CREATININE: 1.08 mg/dL (ref 0.50–1.10)
Chloride: 101 mEq/L (ref 96–112)
Glucose, Bld: 101 mg/dL — ABNORMAL HIGH (ref 70–99)
Potassium: 4.4 mEq/L (ref 3.5–5.3)
Sodium: 139 mEq/L (ref 135–145)

## 2013-10-05 LAB — URINALYSIS, MICROSCOPIC ONLY
BACTERIA UA: NONE SEEN
Casts: NONE SEEN
Crystals: NONE SEEN

## 2013-10-05 NOTE — Progress Notes (Signed)
Patient ID: [name], female   DOB: [birth year], 80 y.o.   MRN: [id number] Patient here today to recheck abnormal labs and recheck urine. Patient states no change in medications.

## 2013-10-08 ENCOUNTER — Other Ambulatory Visit: Payer: Self-pay | Admitting: Internal Medicine

## 2013-10-08 LAB — URINE CULTURE: Colony Count: >=100000

## 2013-10-08 MED ORDER — CEPHALEXIN 500 MG PO CAPS: 500.0000 mg | ORAL_CAPSULE | Freq: Four times a day (QID) | ORAL | Status: AC

## 2013-10-08 MED ORDER — DOXYCYCLINE HYCLATE 100 MG PO CAPS: ORAL_CAPSULE | ORAL | Status: DC

## 2013-11-01 ENCOUNTER — Ambulatory Visit (INDEPENDENT_AMBULATORY_CARE_PROVIDER_SITE_OTHER): Payer: Medicare Other | Admitting: Physician Assistant

## 2013-11-01 ENCOUNTER — Encounter: Payer: Self-pay | Admitting: Physician Assistant

## 2013-11-01 VITALS — HR 84 | Temp 98.4°F | Resp 16 | Ht 63.0 in | Wt 166.0 lb

## 2013-11-01 DIAGNOSIS — R0989 Other specified symptoms and signs involving the circulatory and respiratory systems: Secondary | ICD-10-CM

## 2013-11-01 DIAGNOSIS — R0609 Other forms of dyspnea: Secondary | ICD-10-CM

## 2013-11-01 DIAGNOSIS — N390 Urinary tract infection, site not specified: Secondary | ICD-10-CM

## 2013-11-01 DIAGNOSIS — R0600 Dyspnea, unspecified: Secondary | ICD-10-CM

## 2013-11-01 DIAGNOSIS — R609 Edema, unspecified: Secondary | ICD-10-CM

## 2013-11-01 LAB — CBC WITH DIFFERENTIAL/PLATELET
Basophils Absolute: 0 10*3/uL (ref 0.0–0.1)
Basophils Relative: 0 % (ref 0–1)
EOS ABS: 0 10*3/uL (ref 0.0–0.7)
EOS PCT: 1 % (ref 0–5)
HEMATOCRIT: 46.7 % — AB (ref 36.0–46.0)
HEMOGLOBIN: 15.6 g/dL — AB (ref 12.0–15.0)
LYMPHS PCT: 25 % (ref 12–46)
Lymphs Abs: 1.2 10*3/uL (ref 0.7–4.0)
MCH: 31.3 pg (ref 26.0–34.0)
MCHC: 33.4 g/dL (ref 30.0–36.0)
MCV: 93.6 fL (ref 78.0–100.0)
MONO ABS: 0.4 10*3/uL (ref 0.1–1.0)
MONOS PCT: 8 % (ref 3–12)
Neutro Abs: 3.2 10*3/uL (ref 1.7–7.7)
Neutrophils Relative %: 66 % (ref 43–77)
PLATELETS: 206 10*3/uL (ref 150–400)
RBC: 4.99 MIL/uL (ref 3.87–5.11)
RDW: 15.6 % — ABNORMAL HIGH (ref 11.5–15.5)
WBC: 4.8 10*3/uL (ref 4.0–10.5)

## 2013-11-01 MED ORDER — FUROSEMIDE 40 MG PO TABS: 40.0000 mg | ORAL_TABLET | Freq: Two times a day (BID) | ORAL | Status: DC

## 2013-11-01 NOTE — Patient Instructions (Signed)
Please take 1 lasix tonight, and then take 1 lasix in the morning tomorrow and I should have the labs tomorrow evening and will let you know if you should take another tomorrow lasix tomorrow night, totaling 2   If you have any shortness of breath or chest pain please go to the ER Please elevate your feet above your heart 2-3 times a day for 20 mins.  Continue to decrease salt.   Edema Edema is an abnormal build-up of fluids in tissues. Because this is partly dependent on gravity (water flows to the lowest place), it is more common in the legs and thighs (lower extremities). It is also common in the looser tissues, like around the eyes. Painless swelling of the feet and ankles is common and increases as a person ages. It may affect both legs and may include the calves or even thighs. When squeezed, the fluid may move out of the affected area and may leave a dent for a few moments. CAUSES   Prolonged standing or sitting in one place for extended periods of time. Movement helps pump tissue fluid into the veins, and absence of movement prevents this, resulting in edema.  Varicose veins. The valves in the veins do not work as well as they should. This causes fluid to leak into the tissues.  Fluid and salt overload.  Injury, burn, or surgery to the leg, ankle, or foot, may damage veins and allow fluid to leak out.  Sunburn damages vessels. Leaky vessels allow fluid to go out into the sunburned tissues.  Allergies (from insect bites or stings, medications or chemicals) cause swelling by allowing vessels to become leaky.  Protein in the blood helps keep fluid in your vessels. Low protein, as in malnutrition, allows fluid to leak out.  Hormonal changes, including pregnancy and menstruation, cause fluid retention. This fluid may leak out of vessels and cause edema.  Medications that cause fluid retention. Examples are sex hormones, blood pressure medications, steroid treatment, or  anti-depressants.  Some illnesses cause edema, especially heart failure, kidney disease, or liver disease.  Surgery that cuts veins or lymph nodes, such as surgery done for the heart or for breast cancer, may result in edema. DIAGNOSIS  Your caregiver is usually easily able to determine what is causing your swelling (edema) by simply asking what is wrong (getting a history) and examining you (doing a physical). Sometimes x-rays, EKG (electrocardiogram or heart tracing), and blood work may be done to evaluate for underlying medical illness. TREATMENT  General treatment includes:  Leg elevation (or elevation of the affected body part).  Restriction of fluid intake.  Prevention of fluid overload.  Compression of the affected body part. Compression with elastic bandages or support stockings squeezes the tissues, preventing fluid from entering and forcing it back into the blood vessels.  Diuretics (also called water pills or fluid pills) pull fluid out of your body in the form of increased urination. These are effective in reducing the swelling, but can have side effects and must be used only under your caregiver's supervision. Diuretics are appropriate only for some types of edema. The specific treatment can be directed at any underlying causes discovered. Heart, liver, or kidney disease should be treated appropriately. HOME CARE INSTRUCTIONS   Elevate the legs (or affected body part) above the level of the heart, while lying down.  Avoid sitting or standing still for prolonged periods of time.  Avoid putting anything directly under the knees when lying down, and do not  wear constricting clothing or garters on the upper legs.  Exercising the legs causes the fluid to work back into the veins and lymphatic channels. This may help the swelling go down.  The pressure applied by elastic bandages or support stockings can help reduce ankle swelling.  A low-salt diet may help reduce fluid  retention and decrease the ankle swelling.  Take any medications exactly as prescribed. SEEK MEDICAL CARE IF:  Your edema is not responding to recommended treatments. SEEK IMMEDIATE MEDICAL CARE IF:   You develop shortness of breath or chest pain.  You cannot breathe when you lay down; or if, while lying down, you have to get up and go to the window to get your breath.  You are having increasing swelling without relief from treatment.  You develop a fever over 102 F (38.9 C).  You develop pain or redness in the areas that are swollen.  Tell your caregiver right away if you have gained 03 lb/1.4 kg in 1 day or 05 lb/2.3 kg in a week. MAKE SURE YOU:   Understand these instructions.  Will watch your condition.  Will get help right away if you are not doing well or get worse. Document Released: 07/22/2005 Document Revised: 01/21/2012 Document Reviewed: 03/09/2008 [facility] Patient Information 2014 [place], [place].

## 2013-11-01 NOTE — Progress Notes (Addendum)
   Subjective:    Patient ID: [name], female    DOB: 1921-09-11, 80 y.o.   MRN: [id number]  HPI 80 y.o. female with history of CKD, DMII, presents with edema. She is on Dyzaide every other day, until 1 week a go she started to take it daily. She has one kidney and her daughter is concerned. Daughter states she has been peeing more. She has some SOB but states this is normal, she has been getting up and going to her recliner to sleep but she states it is due to back pain.   Wt Readings from Last 3 Encounters:  11/01/13 166 lb (75.297 kg)  08/24/13 164 lb 9.6 oz (74.662 kg)   Lab Results  Component Value Date   CREATININE 1.08 10/05/2013   BUN 22 10/05/2013   NA 139 10/05/2013   K 4.4 10/05/2013   CL 101 10/05/2013   CO2 25 10/05/2013    Review of Systems  Constitutional: Positive for fatigue. Negative for fever, diaphoresis, activity change and appetite change.  HENT: Negative.   Eyes: Negative.   Respiratory: Positive for cough and shortness of breath. Negative for apnea, chest tightness and wheezing.   Cardiovascular: Positive for leg swelling. Negative for chest pain and palpitations.  Gastrointestinal: Negative.   Genitourinary: Positive for dysuria and frequency. Negative for urgency, flank pain, decreased urine volume, vaginal bleeding, vaginal discharge, difficulty urinating, vaginal pain and pelvic pain.  Musculoskeletal: Negative.   Neurological: Negative.        Objective:   Physical Exam  Constitutional: She appears well-developed and well-nourished.  HENT:  Head: Normocephalic and atraumatic.  Eyes: Conjunctivae are normal. Pupils are equal, round, and reactive to light.  Neck: Normal range of motion. Neck supple.  Cardiovascular: Normal rate.   Pulmonary/Chest: Effort normal.  Decreased breath sounds on the left lower lung  Abdominal: Soft. Bowel sounds are normal. She exhibits no distension. There is no tenderness.  Musculoskeletal: Normal range of motion. She  exhibits edema (3-4 + edema to her knee with weeping, no warmth or erythema).  Lymphadenopathy:    She has no cervical adenopathy.      Assessment & Plan:  Bilateral leg edema? If dependent edema versus CHF- switch to lasix 40mg  BID for 1-2 days, elevate 3 x a day for 20 mins, follow up Thursday/friday, check BNP. ? Needs for unna boots.   UTI?- check UA C&S  Imbalance- may benefit from home health for unna boots and for physical therapy.   OVER 30 minutes of exam, counseling, chart review, referral performed   BNP is in the 2000's, weight is up, some PND- kidney function stable- some the of BNP could be kidney function but with the amount of edema ans symptoms will be on Lasix 40 BID until she returns on Friday, Have ordered a CXR as well for her to get prior to the appointment for the decreased LLL. Informed her daughter to take her to the ER for any worsening SOB/CP/confusion.

## 2013-11-02 LAB — URINALYSIS, ROUTINE W REFLEX MICROSCOPIC
BILIRUBIN URINE: NEGATIVE
Glucose, UA: NEGATIVE mg/dL
Hgb urine dipstick: NEGATIVE
Leukocytes, UA: NEGATIVE
Nitrite: NEGATIVE
PH: 6.5 (ref 5.0–8.0)
Protein, ur: NEGATIVE mg/dL
Specific Gravity, Urine: 1.016 (ref 1.005–1.030)
UROBILINOGEN UA: 0.2 mg/dL (ref 0.0–1.0)

## 2013-11-02 LAB — HEPATIC FUNCTION PANEL
ALBUMIN: 4.1 g/dL (ref 3.5–5.2)
ALT: 14 U/L (ref 0–35)
AST: 24 U/L (ref 0–37)
Alkaline Phosphatase: 45 U/L (ref 39–117)
Bilirubin, Direct: 0.2 mg/dL (ref 0.0–0.3)
Indirect Bilirubin: 0.7 mg/dL (ref 0.2–1.2)
Total Bilirubin: 0.9 mg/dL (ref 0.2–1.2)
Total Protein: 6.5 g/dL (ref 6.0–8.3)

## 2013-11-02 LAB — BASIC METABOLIC PANEL WITH GFR
BUN: 21 mg/dL (ref 6–23)
CALCIUM: 9 mg/dL (ref 8.4–10.5)
CO2: 29 mEq/L (ref 19–32)
CREATININE: 1.36 mg/dL — AB (ref 0.50–1.10)
Chloride: 96 mEq/L (ref 96–112)
GFR, Est African American: 40 mL/min — ABNORMAL LOW
GFR, Est Non African American: 35 mL/min — ABNORMAL LOW
GLUCOSE: 95 mg/dL (ref 70–99)
Potassium: 4.3 mEq/L (ref 3.5–5.3)
Sodium: 137 mEq/L (ref 135–145)

## 2013-11-02 LAB — BRAIN NATRIURETIC PEPTIDE: BRAIN NATRIURETIC PEPTIDE: 2821.7 pg/mL — AB (ref 0.0–100.0)

## 2013-11-02 NOTE — Addendum Note (Signed)
Addended by: Quentin MullingOLLIER, AMANDA R on: 11/02/2013 04:43 PM   Modules accepted: Orders

## 2013-11-03 ENCOUNTER — Encounter (HOSPITAL_COMMUNITY): Payer: Self-pay | Admitting: Emergency Medicine

## 2013-11-03 ENCOUNTER — Encounter: Payer: Self-pay | Admitting: Physician Assistant

## 2013-11-03 ENCOUNTER — Emergency Department (HOSPITAL_COMMUNITY): Payer: Medicare Other

## 2013-11-03 ENCOUNTER — Telehealth: Payer: Self-pay | Admitting: Physician Assistant

## 2013-11-03 ENCOUNTER — Inpatient Hospital Stay (HOSPITAL_COMMUNITY)
Admission: EM | Admit: 2013-11-03 | Discharge: 2013-11-07 | DRG: 291 | Disposition: A | Discharge status: Home Health | Payer: Medicare Other | Attending: Internal Medicine | Admitting: Internal Medicine

## 2013-11-03 ENCOUNTER — Encounter: Payer: Self-pay | Admitting: Internal Medicine

## 2013-11-03 DIAGNOSIS — Z833 Family history of diabetes mellitus: Secondary | ICD-10-CM

## 2013-11-03 DIAGNOSIS — Z888 Allergy status to other drugs, medicaments and biological substances status: Secondary | ICD-10-CM

## 2013-11-03 DIAGNOSIS — N289 Disorder of kidney and ureter, unspecified: Secondary | ICD-10-CM

## 2013-11-03 DIAGNOSIS — Z885 Allergy status to narcotic agent status: Secondary | ICD-10-CM

## 2013-11-03 DIAGNOSIS — R0609 Other forms of dyspnea: Secondary | ICD-10-CM

## 2013-11-03 DIAGNOSIS — J189 Pneumonia, unspecified organism: Secondary | ICD-10-CM | POA: Diagnosis present

## 2013-11-03 DIAGNOSIS — I130 Hypertensive heart and chronic kidney disease with heart failure and stage 1 through stage 4 chronic kidney disease, or unspecified chronic kidney disease: Principal | ICD-10-CM | POA: Diagnosis present

## 2013-11-03 DIAGNOSIS — I5041 Acute combined systolic (congestive) and diastolic (congestive) heart failure: Secondary | ICD-10-CM | POA: Diagnosis present

## 2013-11-03 DIAGNOSIS — I509 Heart failure, unspecified: Secondary | ICD-10-CM

## 2013-11-03 DIAGNOSIS — Z8249 Family history of ischemic heart disease and other diseases of the circulatory system: Secondary | ICD-10-CM

## 2013-11-03 DIAGNOSIS — I10 Essential (primary) hypertension: Secondary | ICD-10-CM | POA: Diagnosis present

## 2013-11-03 DIAGNOSIS — N183 Chronic kidney disease, stage 3 unspecified: Secondary | ICD-10-CM | POA: Diagnosis present

## 2013-11-03 DIAGNOSIS — M810 Age-related osteoporosis without current pathological fracture: Secondary | ICD-10-CM | POA: Diagnosis present

## 2013-11-03 DIAGNOSIS — Z6829 Body mass index (BMI) 29.0-29.9, adult: Secondary | ICD-10-CM

## 2013-11-03 DIAGNOSIS — N189 Chronic kidney disease, unspecified: Secondary | ICD-10-CM | POA: Diagnosis present

## 2013-11-03 DIAGNOSIS — E1142 Type 2 diabetes mellitus with diabetic polyneuropathy: Secondary | ICD-10-CM | POA: Diagnosis present

## 2013-11-03 DIAGNOSIS — E1140 Type 2 diabetes mellitus with diabetic neuropathy, unspecified: Secondary | ICD-10-CM

## 2013-11-03 DIAGNOSIS — K219 Gastro-esophageal reflux disease without esophagitis: Secondary | ICD-10-CM | POA: Diagnosis present

## 2013-11-03 DIAGNOSIS — E1149 Type 2 diabetes mellitus with other diabetic neurological complication: Secondary | ICD-10-CM | POA: Diagnosis present

## 2013-11-03 DIAGNOSIS — E119 Type 2 diabetes mellitus without complications: Secondary | ICD-10-CM

## 2013-11-03 DIAGNOSIS — E785 Hyperlipidemia, unspecified: Secondary | ICD-10-CM

## 2013-11-03 DIAGNOSIS — E1122 Type 2 diabetes mellitus with diabetic chronic kidney disease: Secondary | ICD-10-CM | POA: Diagnosis present

## 2013-11-03 DIAGNOSIS — N179 Acute kidney failure, unspecified: Secondary | ICD-10-CM

## 2013-11-03 DIAGNOSIS — Z79899 Other long term (current) drug therapy: Secondary | ICD-10-CM

## 2013-11-03 LAB — BASIC METABOLIC PANEL
BUN: 24 mg/dL — ABNORMAL HIGH (ref 6–23)
CO2: 30 mEq/L (ref 19–32)
Calcium: 8.6 mg/dL (ref 8.4–10.5)
Chloride: 95 mEq/L — ABNORMAL LOW (ref 96–112)
Creatinine, Ser: 1.62 mg/dL — ABNORMAL HIGH (ref 0.50–1.10)
GFR, EST AFRICAN AMERICAN: 31 mL/min — AB (ref >90)
GFR, EST NON AFRICAN AMERICAN: 27 mL/min — AB (ref >90)
Glucose, Bld: 104 mg/dL — ABNORMAL HIGH (ref 70–99)
POTASSIUM: 3.8 meq/L (ref 3.7–5.3)
SODIUM: 141 meq/L (ref 137–147)

## 2013-11-03 LAB — URINALYSIS, ROUTINE W REFLEX MICROSCOPIC
Bilirubin Urine: NEGATIVE
Glucose, UA: NEGATIVE mg/dL
Hgb urine dipstick: NEGATIVE
KETONES UR: NEGATIVE mg/dL
LEUKOCYTES UA: NEGATIVE
Nitrite: NEGATIVE
PROTEIN: NEGATIVE mg/dL
Specific Gravity, Urine: 1.012 (ref 1.005–1.030)
Urobilinogen, UA: 0.2 mg/dL (ref 0.0–1.0)
pH: 7 (ref 5.0–8.0)

## 2013-11-03 LAB — GLUCOSE, CAPILLARY: Glucose-Capillary: 198 mg/dL — ABNORMAL HIGH (ref 70–99)

## 2013-11-03 LAB — URINE CULTURE: Colony Count: >=100000

## 2013-11-03 LAB — CBC
HCT: 44.7 % (ref 36.0–46.0)
Hemoglobin: 15.4 g/dL — ABNORMAL HIGH (ref 12.0–15.0)
MCH: 32.6 pg (ref 26.0–34.0)
MCHC: 34.5 g/dL (ref 30.0–36.0)
MCV: 94.5 fL (ref 78.0–100.0)
PLATELETS: 177 10*3/uL (ref 150–400)
RBC: 4.73 MIL/uL (ref 3.87–5.11)
RDW: 15.3 % (ref 11.5–15.5)
WBC: 4.1 10*3/uL (ref 4.0–10.5)

## 2013-11-03 LAB — TROPONIN I: Troponin I: <0.3 ng/mL (ref <0.30)

## 2013-11-03 LAB — TSH: TSH: 3.4 u[IU]/mL (ref 0.350–4.500)

## 2013-11-03 LAB — PRO B NATRIURETIC PEPTIDE: Pro B Natriuretic peptide (BNP): 19644 pg/mL — ABNORMAL HIGH (ref 0–450)

## 2013-11-03 LAB — STREP PNEUMONIAE URINARY ANTIGEN: STREP PNEUMO URINARY ANTIGEN: NEGATIVE

## 2013-11-03 MED ORDER — VITAMIN D 1000 UNITS PO TABS
2000.0000 [IU] | ORAL_TABLET | Freq: Every day | ORAL | Status: DC
  Administered 2013-11-03 – 2013-11-07 (×5): 2000 [IU] via ORAL
  Filled 2013-11-03 (×5): qty 2

## 2013-11-03 MED ORDER — SODIUM CHLORIDE 0.9 % IJ SOLN
3.0000 mL | Freq: Two times a day (BID) | INTRAMUSCULAR | Status: DC
  Administered 2013-11-03: 3 mL via INTRAVENOUS

## 2013-11-03 MED ORDER — FUROSEMIDE 10 MG/ML IJ SOLN
40.0000 mg | Freq: Once | INTRAMUSCULAR | Status: AC
  Administered 2013-11-03: 40 mg via INTRAVENOUS
  Filled 2013-11-03: qty 4

## 2013-11-03 MED ORDER — FUROSEMIDE 10 MG/ML IJ SOLN
40.0000 mg | Freq: Two times a day (BID) | INTRAMUSCULAR | Status: DC
  Administered 2013-11-03: 40 mg via INTRAVENOUS
  Filled 2013-11-03 (×3): qty 4

## 2013-11-03 MED ORDER — INSULIN ASPART 100 UNIT/ML ~~LOC~~ SOLN
0.0000 [IU] | Freq: Three times a day (TID) | SUBCUTANEOUS | Status: DC
  Administered 2013-11-04: 1 [IU] via SUBCUTANEOUS

## 2013-11-03 MED ORDER — ACETAMINOPHEN 650 MG RE SUPP: 650.0000 mg | Freq: Four times a day (QID) | RECTAL | Status: DC | PRN

## 2013-11-03 MED ORDER — SODIUM CHLORIDE 0.9 % IJ SOLN: 3.0000 mL | Freq: Two times a day (BID) | INTRAMUSCULAR | Status: DC

## 2013-11-03 MED ORDER — ENOXAPARIN SODIUM 30 MG/0.3ML ~~LOC~~ SOLN
30.0000 mg | SUBCUTANEOUS | Status: DC
  Administered 2013-11-03 – 2013-11-06 (×4): 30 mg via SUBCUTANEOUS
  Filled 2013-11-03 (×5): qty 0.3

## 2013-11-03 MED ORDER — AZITHROMYCIN 500 MG IV SOLR
500.0000 mg | Freq: Once | INTRAVENOUS | Status: AC
  Administered 2013-11-03: 500 mg via INTRAVENOUS

## 2013-11-03 MED ORDER — DEXTROSE 5 % IV SOLN
1.0000 g | Freq: Every day | INTRAVENOUS | Status: DC
  Administered 2013-11-04: 1 g via INTRAVENOUS
  Filled 2013-11-03 (×2): qty 10

## 2013-11-03 MED ORDER — ACETAMINOPHEN 325 MG PO TABS: 650.0000 mg | ORAL_TABLET | Freq: Four times a day (QID) | ORAL | Status: DC | PRN

## 2013-11-03 MED ORDER — SODIUM CHLORIDE 0.9 % IJ SOLN: 3.0000 mL | INTRAMUSCULAR | Status: DC | PRN

## 2013-11-03 MED ORDER — ONDANSETRON HCL 4 MG/2ML IJ SOLN: 4.0000 mg | Freq: Four times a day (QID) | INTRAMUSCULAR | Status: DC | PRN

## 2013-11-03 MED ORDER — ONDANSETRON HCL 4 MG PO TABS: 4.0000 mg | ORAL_TABLET | Freq: Four times a day (QID) | ORAL | Status: DC | PRN

## 2013-11-03 MED ORDER — ASPIRIN EC 81 MG PO TBEC
81.0000 mg | DELAYED_RELEASE_TABLET | Freq: Every day | ORAL | Status: DC
  Administered 2013-11-03 – 2013-11-07 (×5): 81 mg via ORAL
  Filled 2013-11-03 (×5): qty 1

## 2013-11-03 MED ORDER — SERTRALINE HCL 50 MG PO TABS
50.0000 mg | ORAL_TABLET | Freq: Every day | ORAL | Status: DC
  Administered 2013-11-03 – 2013-11-07 (×5): 50 mg via ORAL
  Filled 2013-11-03 (×6): qty 1

## 2013-11-03 MED ORDER — AZITHROMYCIN 500 MG PO TABS
500.0000 mg | ORAL_TABLET | Freq: Every day | ORAL | Status: DC
  Administered 2013-11-03 – 2013-11-07 (×5): 500 mg via ORAL
  Filled 2013-11-03 (×5): qty 1

## 2013-11-03 MED ORDER — TEMAZEPAM 7.5 MG PO CAPS
7.5000 mg | ORAL_CAPSULE | Freq: Every evening | ORAL | Status: DC | PRN
  Administered 2013-11-03 – 2013-11-06 (×4): 7.5 mg via ORAL
  Filled 2013-11-03 (×4): qty 1

## 2013-11-03 MED ORDER — NITROGLYCERIN 2 % TD OINT
1.0000 [in_us] | TOPICAL_OINTMENT | Freq: Once | TRANSDERMAL | Status: AC
  Administered 2013-11-03: 1 [in_us] via TOPICAL
  Filled 2013-11-03: qty 1

## 2013-11-03 MED ORDER — BISACODYL 5 MG PO TBEC
10.0000 mg | DELAYED_RELEASE_TABLET | Freq: Every day | ORAL | Status: DC | PRN
  Administered 2013-11-05: 10 mg via ORAL
  Filled 2013-11-03: qty 2

## 2013-11-03 MED ORDER — SODIUM CHLORIDE 0.9 % IV SOLN: 250.0000 mL | INTRAVENOUS | Status: DC | PRN

## 2013-11-03 MED ORDER — CEFTRIAXONE SODIUM 1 G IJ SOLR
1.0000 g | Freq: Once | INTRAMUSCULAR | Status: AC
  Administered 2013-11-03: 1 g via INTRAVENOUS
  Filled 2013-11-03: qty 10

## 2013-11-03 NOTE — ED Notes (Signed)
Attempt to call report.

## 2013-11-03 NOTE — Progress Notes (Signed)
Patient transferred from the ED to room 3E13. Daughter at the bedside. Patient oriented to room and call bell system. Dinner tray ordered. VSS and complains of no pain at this time. Will continue to monitor to end of shift.

## 2013-11-03 NOTE — ED Notes (Signed)
Pt was seen by PCP Monday and had elevated BNP.  Pt was started on Lasix yesterday.  Pt presents today with continued c/o BLE swelling and SOB especially with exertion.

## 2013-11-03 NOTE — Progress Notes (Addendum)
[facility] History and Physical  [place] WUJ:[id number] DOB: 04/05/1922 DOA: 11/03/2013  Referring physician: EDP PCP: Nadean CorwinMCKEOWN,WILLIAM DAVID, MD   Chief Complaint: worsening leg swelling and DOE  HPI: [name] is a 80 y.o. female with h/o HTN, CKD-last cr 1.13 in 08/2013>>1.36 on 3/30, GERD with chronic cough, DM on no meds who presents with the above complaints. She states that over the past month she has had worsening of leg swelling and DOE.she went to see her PCP 2days ago and labs revealed elevated BNP of about 2821. She was started on lasix 40bid, but her symptoms were continuing to worsen and so she was referrred to the ED for admission. She admits to having to sleep in recliner due to SOB. She reports chronic non productive cough attributed to GERD and states that it has not changed. She denies fevers. Pt also denies CP. She was seen in ED and BNP 19,644, CXR read as LLL PNA, wbc 4.1, cr 1.62 and she was given IV lasix and started on empiric abx. She is admitted for further eval and management.   Review of Systems The patient denies anorexia, fever, weight loss,, vision loss, decreased hearing, hoarseness, chest pain, syncope, hemoptysis, abdominal pain, melena, hematochezia, severe indigestion/heartburn, hematuria, incontinence, genital sores, muscle weakness, suspicious skin lesions, transient blindness, depression, unusual weight change, abnormal bleeding, enlarged lymph    Past Medical History  Diagnosis Date  . Hyperlipidemia   . Hypertension   . Type II or unspecified type diabetes mellitus without mention of complication, not stated as uncontrolled   . Diabetic neuropathy   . Chronic kidney disease   . GERD (gastroesophageal reflux disease)   . Osteoporosis    Past Surgical History  Procedure Laterality Date  . Nephrectomy Right 1959  . Breast lumpectomy      [id number],1992,2005  . Bladder surgery  1984  . Colostomy  2004    Reversed 2005  . Eye  surgery  2008 and 2009   Social History:  reports that she has never smoked. She does not have any smokeless tobacco history on file. She reports that she drinks alcohol. She reports that she does not use illicit drugs.  Allergies  Allergen Reactions  . Ace Inhibitors   . Codeine     dysuria  . Lisinopril Swelling  . Morphine And Related     dysuria  . Percocet [Oxycodone-Acetaminophen]     dysuria  . Vitamin C     dysuria  . Vitamin D Analogs     Large doses cause dysuria    Family History  Problem Relation Age of Onset  . Heart disease Father   . Heart disease Sister   . Diabetes Daughter   . Hypertension Son      Prior to Admission medications   Medication Sig Start Date End Date Taking? Authorizing Provider  acetaminophen (TYLENOL) 325 MG tablet Take 650 mg by mouth every 6 (six) hours as needed for mild pain.    Yes Historical Provider, MD  B Complex Vitamins (VITAMIN-B COMPLEX PO) Take 1 tablet by mouth daily.    Yes Historical Provider, MD  Cholecalciferol (VITAMIN D) 2000 UNITS CAPS Take 2,000 Units by mouth daily.   Yes Historical Provider, MD  furosemide (LASIX) 40 MG tablet Take 40 mg by mouth 2 (two) times daily.   Yes Historical Provider, MD  MAGNESIUM PO Take 1 tablet by mouth daily.    Yes Historical Provider, MD  Omega-3 Fatty Acids (FISH  OIL PO) Take 1 capsule by mouth daily.    Yes Historical Provider, MD  OVER THE COUNTER MEDICATION Take 1 tablet by mouth daily. Tumeric   Yes Historical Provider, MD  OVER THE COUNTER MEDICATION Take 1 tablet by mouth daily. Herbavision-Lutin-Bilberry   Yes Historical Provider, MD  Probiotic Product (PROBIOTIC DAILY PO) Take 1 tablet by mouth daily.    Yes Historical Provider, MD  sertraline (ZOLOFT) 100 MG tablet Take 50 mg by mouth daily.    Yes Historical Provider, MD  triamterene-hydrochlorothiazide (DYAZIDE) 37.5-25 MG per capsule Take 1 capsule by mouth daily.    Yes Historical Provider, MD  VITAMIN K PO Take 1 tablet  by mouth daily.    Yes Historical Provider, MD   Physical Exam: Filed Vitals:   11/03/13 1730  BP: 146/63  Pulse: 83  Temp:   Resp: 24    BP 146/63  Pulse 83  Temp(Src) 98.2 F (36.8 C) (Oral)  Resp 24  Ht 5\' 3"  (1.6 m)  Wt 75.297 kg (166 lb)  BMI 29.41 kg/m2  SpO2 98% Constitutional: Vital signs reviewed.  Patient is a well-developed and well-nourished  in no acute distress and cooperative with exam. Alert and oriented x3.  Head: Normocephalic and atraumatic Nose: No erythema or drainage noted.  Turbinates normal Mouth: no erythema or exudates, MMM Eyes: PERRL, EOMI, conjunctivae normal, No scleral icterus.  Neck: Supple, Trachea midline normal ROM, No JVD, mass, thyromegaly, or carotid bruit present.  Cardiovascular: RRR, S1 normal, S2 normal, no MRG, pulses symmetric and intact bilaterally Pulmonary/Chest: decreased BS, CTAB, no wheezes, rales, or rhonchi Abdominal: Soft. Non-tender, non-distended, bowel sounds are normal, no masses, organomegaly, or guarding present.  GU: no CVA tenderness Extremities:+3edema bil with small scattered erythematous patches Neurological: A&O x3, Strength is normal and symmetric bilaterally, cranial nerve II-XII are grossly intact, no focal motor deficit, sensory intact to light touch bilaterally.  Skin: Warm, dry and intact. No rash, cyanosis, or clubbing.  Psychiatric: Normal mood and affect.               Labs on Admission:  Basic Metabolic Panel:  Recent Labs Lab 11/01/13 1725 11/03/13 1442  NA 137 141  K 4.3 3.8  CL 96 95*  CO2 29 30  GLUCOSE 95 104*  BUN 21 24*  CREATININE 1.36* 1.62*  CALCIUM 9.0 8.6   Liver Function Tests:  Recent Labs Lab 11/01/13 1725  AST 24  ALT 14  ALKPHOS 45  BILITOT 0.9  PROT 6.5  ALBUMIN 4.1   No results found for this basename: LIPASE, AMYLASE,  in the last 168 hours No results found for this basename: AMMONIA,  in the last 168 hours CBC:  Recent Labs Lab 11/01/13 1725  11/03/13 1442  WBC 4.8 4.1  NEUTROABS 3.2  --   HGB 15.6* 15.4*  HCT 46.7* 44.7  MCV 93.6 94.5  PLT 206 177   Cardiac Enzymes: No results found for this basename: CKTOTAL, CKMB, CKMBINDEX, TROPONINI,  in the last 168 hours  BNP (last 3 results)  Recent Labs  11/03/13 1442  PROBNP 19644.0*   CBG: No results found for this basename: GLUCAP,  in the last 168 hours  Radiological Exams on Admission: Dg Chest 2 View  11/03/2013   CLINICAL DATA:  Dry hacking cough. Bilateral leg and feet swelling. Short of breath.  EXAM: CHEST  2 VIEW  COMPARISON:  07/05/2011  FINDINGS: Mild to moderate enlargement of the cardiac silhouette. Aorta is mildly uncoiled.  No mediastinal or hilar masses or adenopathy.  There is opacity at the left lung base silhouetting the left hemidiaphragm. This is consistent with pneumonia. Lungs are hyperexpanded but otherwise clear. No pleural effusions. No pneumothorax.  Bony thorax is diffusely demineralized but grossly intact.  IMPRESSION: Left lower lobe pneumonia.   Electronically Signed   By: Amie Portland M.D.   On: 11/03/2013 15:18    EKG: with nonspecific intraventricular conduction block, NSR with no acute ichemic changes noted  Assessment/Plan Active Problems:   CHF, acute -As discussed above, will continue IV lasix with close monitoring of renal function. -cycle Troponins, obtain echo -monitor strict I/O, daily weights, follow and further adjust lasix as appropriate -follow and consult cards pending studies and clinical course Probable CAP (community acquired pneumonia) -per CXR, though pt with no change in cough, no fever and no leukocytosis -continue empiric abx with rocephin and zithromax for now, follow recheck CXR in am  and may d/c abx if infiltrate resolved with diuresis as above   Acute on chronic renal failure -monitor renal function with diuresis -if continuing to worsen adjust lasix/consult renal as appropropriate   Hypertension -d/c maxzide,  monitor on lasix and follow treat accordingly    Type II or unspecified type diabetes mellitus -monitor accuchecks, cover with SSI -check A1C, she states she has not been on meds for a long time now    GERD (gastroesophageal reflux disease) -continue ppi        Code Status: full Family Communication: daughter at bedside Disposition Plan: admit to tele  Time spent:   Kela Millin [facility] Pager [phone]

## 2013-11-03 NOTE — ED Provider Notes (Signed)
CSN: [id number]     Arrival date & time 11/03/13  1431 History   First MD Initiated Contact with Patient 11/03/13 1613     Chief Complaint  Patient presents with  . Leg Swelling  . Shortness of Breath     (Consider location/radiation/quality/duration/timing/severity/associated sxs/prior Treatment) HPI Patient presents emergency Department with her daughter. Patient reports she started having swelling of her lower extremities a few weeks ago which she did not tell her daughter about. She also has started noticing she has dyspnea on exertion and PND. She has been having to sleep in a recliner at night. She has a chronic cough without fever. She denies any chest pain. She has had a normal appetite without vomiting or diarrhea. She is chronic constipation. She states her abdomen has been swelling off and on but is not swollen now. She was seen by her PCP 2 days ago and had blood work done showing she had a BNP of 2000. She was started on Lasix 40 mg twice a day. Her daughter reports she is not improving so she was instructed to come to the emergency department. Patient has no history of prior heart problems, she has never seen a cardiologist.  Family history patient's father died in his 50s after several heart attacks, her sister had angina and died in her ED stay.  PCP Dr Oneta Rack  Past Medical History  Diagnosis Date  . Hyperlipidemia   . Hypertension   . Type II or unspecified type diabetes mellitus without mention of complication, not stated as uncontrolled   . Diabetic neuropathy   . Chronic kidney disease   . GERD (gastroesophageal reflux disease)   . Osteoporosis    Past Surgical History  Procedure Laterality Date  . Nephrectomy Right 1959  . Breast lumpectomy      [id number]  . Bladder surgery  1984  . Colostomy  2004    Reversed 2005  . Eye surgery  2008 and 2009   Family History  Problem Relation Age of Onset  . Heart disease Father   . Heart disease Sister   .  Diabetes Daughter   . Hypertension Son    History  Substance Use Topics  . Smoking status: Never Smoker   . Smokeless tobacco: Not on file  . Alcohol Use: Yes     Comment: Occ wine statesshe is going to stop   Lives with her daughter   OB History   Grav Para Term Preterm Abortions TAB SAB Ect Mult Living                 Review of Systems  All other systems reviewed and are negative.      Allergies  Ace inhibitors; Codeine; Lisinopril; Morphine and related; Percocet; Vitamin c; and Vitamin d analogs  Home Medications   Current Outpatient Rx  Name  Route  Sig  Dispense  Refill  . acetaminophen (TYLENOL) 325 MG tablet   Oral   Take 650 mg by mouth every 6 (six) hours as needed for mild pain.          . B Complex Vitamins (VITAMIN-B COMPLEX PO)   Oral   Take 1 tablet by mouth daily.          . Cholecalciferol (VITAMIN D) 2000 UNITS CAPS   Oral   Take 2,000 Units by mouth daily.         . furosemide (LASIX) 40 MG tablet   Oral   Take 40 mg by mouth  2 (two) times daily.         Marland Kitchen. MAGNESIUM PO   Oral   Take 1 tablet by mouth daily.          . Omega-3 Fatty Acids (FISH OIL PO)   Oral   Take 1 capsule by mouth daily.          Marland Kitchen. OVER THE COUNTER MEDICATION   Oral   Take 1 tablet by mouth daily. Tumeric         . OVER THE COUNTER MEDICATION   Oral   Take 1 tablet by mouth daily. Herbavision-Lutin-Bilberry         . Probiotic Product (PROBIOTIC DAILY PO)   Oral   Take 1 tablet by mouth daily.          . sertraline (ZOLOFT) 100 MG tablet   Oral   Take 50 mg by mouth daily.          Marland Kitchen. triamterene-hydrochlorothiazide (DYAZIDE) 37.5-25 MG per capsule   Oral   Take 1 capsule by mouth daily.          Marland Kitchen. VITAMIN K PO   Oral   Take 1 tablet by mouth daily.           BP 146/68  Pulse 88  Temp(Src) 98.2 F (36.8 C) (Oral)  Resp 20  Ht 5\' 3"  (1.6 m)  Wt 166 lb (75.297 kg)  BMI 29.41 kg/m2  SpO2 95%  Vital signs normal    Physical Exam  Nursing note and vitals reviewed. Constitutional: She is oriented to person, place, and time. She appears well-developed and well-nourished.  Non-toxic appearance. She does not appear ill. No distress.  HENT:  Head: Normocephalic and atraumatic.  Right Ear: External ear normal.  Left Ear: External ear normal.  Nose: Nose normal. No mucosal edema or rhinorrhea.  Mouth/Throat: Oropharynx is clear and moist and mucous membranes are normal. No dental abscesses or uvula swelling.  Eyes: Conjunctivae and EOM are normal. Pupils are equal, round, and reactive to light.  Neck: Normal range of motion and full passive range of motion without pain. Neck supple.  Cardiovascular: Normal rate, regular rhythm and normal heart sounds.  Exam reveals no gallop and no friction rub.   No murmur heard. Pulmonary/Chest: Effort normal and breath sounds normal. No respiratory distress. She has no wheezes. She has no rhonchi. She has no rales. She exhibits no tenderness and no crepitus.  She has mild diminished breath sounds without wheezing rhonchi or rales.  Abdominal: Soft. Normal appearance and bowel sounds are normal. She exhibits no distension. There is no tenderness. There is no rebound and no guarding.  Musculoskeletal: Normal range of motion. She exhibits edema. She exhibits no tenderness.   Patient has diffuse swelling of her lower extremities with 1-2+ by mouth edema. She has mild diffuse redness of the lower extremities with some clear blistering with some lesions having clear drainage.  Neurological: She is alert and oriented to person, place, and time. She has normal strength. No cranial nerve deficit.  Skin: Skin is warm, dry and intact. No rash noted. There is erythema. There is pallor.  Psychiatric: She has a normal mood and affect. Her speech is normal and behavior is normal. Her mood appears not anxious.    ED Course  Procedures (including critical care time)  Medications   cefTRIAXone (ROCEPHIN) 1 g in dextrose 5 % 50 mL IVPB (1 g Intravenous New Bag/Given 11/03/13 1710)  azithromycin (ZITHROMAX) 500 mg  in dextrose 5 % 250 mL IVPB (not administered)  furosemide (LASIX) injection 40 mg (40 mg Intravenous Given 11/03/13 1707)  nitroGLYCERIN (NITROGLYN) 2 % ointment 1 inch (1 inch Topical Given 11/03/13 1710)   Daughter requests Foley catheter because pt has trouble getting out of bed to use the bathroom. Pt started on CAP antibiotics.   17:14 Dr Donna Bernard, admit to tele, team 10  Labs Review Results for orders placed during the hospital encounter of 11/03/13  CBC      Result Value Ref Range   WBC 4.1  4.0 - 10.5 K/uL   RBC 4.73  3.87 - 5.11 MIL/uL   Hemoglobin 15.4 (*) 12.0 - 15.0 g/dL   HCT 16.1  09.6 - 04.5 %   MCV 94.5  78.0 - 100.0 fL   MCH 32.6  26.0 - 34.0 pg   MCHC 34.5  30.0 - 36.0 g/dL   RDW 40.9  81.1 - 91.4 %   Platelets 177  150 - 400 K/uL  BASIC METABOLIC PANEL      Result Value Ref Range   Sodium 141  137 - 147 mEq/L   Potassium 3.8  3.7 - 5.3 mEq/L   Chloride 95 (*) 96 - 112 mEq/L   CO2 30  19 - 32 mEq/L   Glucose, Bld 104 (*) 70 - 99 mg/dL   BUN 24 (*) 6 - 23 mg/dL   Creatinine, Ser 7.82 (*) 0.50 - 1.10 mg/dL   Calcium 8.6  8.4 - 95.6 mg/dL   GFR calc non Af Amer 27 (*) >90 mL/min   GFR calc Af Amer 31 (*) >90 mL/min  PRO B NATRIURETIC PEPTIDE      Result Value Ref Range   Pro B Natriuretic peptide (BNP) 19644.0 (*) 0 - 450 pg/mL   Laboratory interpretation all normal except elevated BNP, progressive renal failure since 3/3 when creat was 1.1, then it was 1.4 and now 1.6    Imaging Review Dg Chest 2 View  11/03/2013   CLINICAL DATA:  Dry hacking cough. Bilateral leg and feet swelling. Short of breath.  EXAM: CHEST  2 VIEW  COMPARISON:  07/05/2011  FINDINGS: Mild to moderate enlargement of the cardiac silhouette. Aorta is mildly uncoiled. No mediastinal or hilar masses or adenopathy.  There is opacity at the left lung base silhouetting  the left hemidiaphragm. This is consistent with pneumonia. Lungs are hyperexpanded but otherwise clear. No pleural effusions. No pneumothorax.  Bony thorax is diffusely demineralized but grossly intact.  IMPRESSION: Left lower lobe pneumonia.   Electronically Signed   By: Amie Portland M.D.   On: 11/03/2013 15:18     EKG Interpretation   Date/Time:  Wednesday November 03 2013 14:37:01 EDT Ventricular Rate:  82 PR Interval:  156 QRS Duration: 154 QT Interval:  458 QTC Calculation: 535 R Axis:   109 Text Interpretation:  Normal sinus rhythm Rightward axis Non-specific  intra-ventricular conduction block No significant change since last  tracing 04 Jul 2008 Confirmed by [facility]  MD-I, Imraan Wendell ([id number]) on 11/03/2013  4:36:07 PM      MDM   patient presents with shortness of breath that started about 3 weeks ago with symptoms that are consistent with new onset congestive heart failure with swelling of the lower legs, dyspnea on exertion and PND. Her BNP is 20000 today and reportedly was 2000 two days ago at her doctor's office (there may be a difference in the way the labs are reported). She also has a  new renal insufficiency since she was started on the Lasix. Patient is being admitted for further evaluation i.e. echocardiogram and treatment of her congestive heart failure. Congestive heart failure is a new diagnosis. Patient also was started on antibiotics for community-acquired pneumonia as her chest  x-ray was read as possible pneumonia. She does relate chronic cough without fever.    Final diagnoses:  CHF (congestive heart failure)  Renal insufficiency  CAP (community acquired pneumonia)  DOE (dyspnea on exertion)    Plan admission   Devoria Albe, MD, Armando Gang     Ward Givens, MD 11/03/13 [phone]

## 2013-11-03 NOTE — ED Notes (Signed)
Admitting doctor at bedside evaluating patient

## 2013-11-03 NOTE — ED Notes (Signed)
Edema present to BLE. Skin red and dry. Fluid filled blisters noted to LLE. Pt reports shortness of breath for several days with walking or movement, increase in swelling to BLE. Denies chest pain. Pt is Hard of hearing, hearing aids on person. Cough that causes her to not be able to catch her breathe, chronic.

## 2013-11-03 NOTE — Telephone Encounter (Signed)
Mother is 80 yo female with history of CAD DMII, right nephrectomy, was seen for edema and possible CHF in the office. Her BNP was in the 2000's, she was given outpatient lasix 40mg  BID however her daughter states she is not getting better. She has refractory edema, some SOB, sleeps better in a recliner. After a long discussion with her daughter we have decided that due to the risk of ARF it would be best for the patient to go to the ER to see if she should be admitted for refractory edema for IV lasix and to monitor her kidney function closer and to rule out diastolic heart failure.   In addition her daughter is concerned for NPH, states she has had confusion, incontinence, and unstable gait recently. We may address this after we deal with the more pressing matter of fluid overload. Her daughter is going to take her to [place].

## 2013-11-04 ENCOUNTER — Inpatient Hospital Stay (HOSPITAL_COMMUNITY): Payer: Medicare Other

## 2013-11-04 DIAGNOSIS — E119 Type 2 diabetes mellitus without complications: Secondary | ICD-10-CM

## 2013-11-04 DIAGNOSIS — I319 Disease of pericardium, unspecified: Secondary | ICD-10-CM

## 2013-11-04 LAB — HEMOGLOBIN A1C
Hgb A1c MFr Bld: 6.6 % — ABNORMAL HIGH (ref <5.7)
Mean Plasma Glucose: 143 mg/dL — ABNORMAL HIGH (ref <117)

## 2013-11-04 LAB — LEGIONELLA ANTIGEN, URINE: Legionella Antigen, Urine: NEGATIVE

## 2013-11-04 LAB — BASIC METABOLIC PANEL
BUN: 29 mg/dL — ABNORMAL HIGH (ref 6–23)
CO2: 26 meq/L (ref 19–32)
Calcium: 8.2 mg/dL — ABNORMAL LOW (ref 8.4–10.5)
Chloride: 96 mEq/L (ref 96–112)
Creatinine, Ser: 1.57 mg/dL — ABNORMAL HIGH (ref 0.50–1.10)
GFR calc Af Amer: 33 mL/min — ABNORMAL LOW (ref >90)
GFR, EST NON AFRICAN AMERICAN: 28 mL/min — AB (ref >90)
Glucose, Bld: 99 mg/dL (ref 70–99)
Potassium: 3.3 mEq/L — ABNORMAL LOW (ref 3.7–5.3)
SODIUM: 139 meq/L (ref 137–147)

## 2013-11-04 LAB — TROPONIN I
Troponin I: <0.3 ng/mL (ref <0.30)
Troponin I: <0.3 ng/mL (ref <0.30)

## 2013-11-04 LAB — GLUCOSE, CAPILLARY
GLUCOSE-CAPILLARY: 98 mg/dL (ref 70–99)
GLUCOSE-CAPILLARY: 144 mg/dL — AB (ref 70–99)
GLUCOSE-CAPILLARY: 113 mg/dL — AB (ref 70–99)
Glucose-Capillary: 123 mg/dL — ABNORMAL HIGH (ref 70–99)

## 2013-11-04 MED ORDER — POTASSIUM CHLORIDE CRYS ER 20 MEQ PO TBCR
40.0000 meq | EXTENDED_RELEASE_TABLET | Freq: Once | ORAL | Status: AC
  Administered 2013-11-04: 40 meq via ORAL
  Filled 2013-11-04: qty 2

## 2013-11-04 MED ORDER — FUROSEMIDE 10 MG/ML IJ SOLN
2.0000 mg/h | INTRAVENOUS | Status: DC
  Administered 2013-11-04: 8 mg/h via INTRAVENOUS
  Filled 2013-11-04: qty 25

## 2013-11-04 MED ORDER — FUROSEMIDE 10 MG/ML IJ SOLN: 40.0000 mg | Freq: Three times a day (TID) | INTRAMUSCULAR | Status: DC

## 2013-11-04 MED ORDER — METOLAZONE 2.5 MG PO TABS
2.5000 mg | ORAL_TABLET | Freq: Once | ORAL | Status: AC
  Administered 2013-11-04: 2.5 mg via ORAL
  Filled 2013-11-04: qty 1

## 2013-11-04 MED ORDER — DOCUSATE SODIUM 100 MG PO CAPS
100.0000 mg | ORAL_CAPSULE | Freq: Every day | ORAL | Status: DC | PRN
  Administered 2013-11-04: 100 mg via ORAL
  Filled 2013-11-04 (×2): qty 1

## 2013-11-04 NOTE — Progress Notes (Signed)
UR completed Fredrick Dray K. Keyuna Cuthrell, RN, BSN, MSHL, CCM  11/04/2013 11:30 AM

## 2013-11-04 NOTE — Progress Notes (Signed)
[facility] PROGRESS NOTE Interim History: 80 y.o. female with h/o HTN, CKD-last cr 1.13 in 08/2013>>1.36 on 3/30, GERD with chronic cough, DM on no meds who presents with the above complaints. She states that over the past month she has had worsening of leg swelling and DOE.she went to see her PCP 2days ago and labs revealed elevated BNP of about 2821. She was started on lasix 40bid, but her symptoms were continuing to worsen and so she was referrred to the ED for admission.  Filed Weights   11/03/13 1437 11/04/13 0540  Weight: 75.297 kg (166 lb) 68.7 kg (151 lb 7.3 oz)        Intake/Output Summary (Last 24 hours) at 11/04/13 0747 Last data filed at 11/04/13 0654  Gross per 24 hour  Intake    480 ml  Output   2400 ml  Net  -1920 ml     Assessment/Plan: CHF, acute/cardiorenal syndrome: - Start lasix infusion, with good UOP. Place ted hose. - Cr. Improving. Replete electrolytes. - Fluid restrict, strict I and O's. - Echo pending.  CAP: - Rocephin and azithromycin started on 4.2.2015. - Afebrile. Afebrile no leucocytosis.  Acute on chronic renal failure: - Improving with diuresis.  GERD (gastroesophageal reflux disease) - Protonix  Hypertension - stable.  Type II or unspecified type diabetes mellitus without mention of complication, not stated as uncontrolled - BG  Code Status: full  Family Communication: daughter at bedside  Disposition Plan: admit to tele   Consultants:  none  Procedures: ECHO: pending  Antibiotics:  None  HPI/Subjective: SOB improved.  Objective: Filed Vitals:   11/03/13 1730 11/03/13 2200 11/04/13 0206 11/04/13 0540  BP: 146/63 107/60 105/58 121/53  Pulse: 83 77 76 74  Temp:  97.6 F (36.4 C) 98.2 F (36.8 C) 97.9 F (36.6 C)  TempSrc:  Oral Oral Oral  Resp: 24 22 19 20   Height:      Weight:    68.7 kg (151 lb 7.3 oz)  SpO2: 98% 95% 93% 90%     Exam:  General: Alert, awake, oriented x3, in no acute distress.   HEENT: No bruits, no goiter. +JVD Heart: Regular rate and rhythm, without murmurs, rubs, gallops.  Lungs: Good air movement, crackles bilaterally. Abdomen: Soft, nontender, nondistended, positive bowel sounds.   Data Reviewed: Basic Metabolic Panel:  Recent Labs Lab 11/01/13 1725 11/03/13 1442 11/04/13 0142  NA 137 141 139  K 4.3 3.8 3.3*  CL 96 95* 96  CO2 29 30 26   GLUCOSE 95 104* 99  BUN 21 24* 29*  CREATININE 1.36* 1.62* 1.57*  CALCIUM 9.0 8.6 8.2*   Liver Function Tests:  Recent Labs Lab 11/01/13 1725  AST 24  ALT 14  ALKPHOS 45  BILITOT 0.9  PROT 6.5  ALBUMIN 4.1   No results found for this basename: LIPASE, AMYLASE,  in the last 168 hours No results found for this basename: AMMONIA,  in the last 168 hours CBC:  Recent Labs Lab 11/01/13 1725 11/03/13 1442  WBC 4.8 4.1  NEUTROABS 3.2  --   HGB 15.6* 15.4*  HCT 46.7* 44.7  MCV 93.6 94.5  PLT 206 177   Cardiac Enzymes:  Recent Labs Lab 11/03/13 2106 11/04/13 0142  TROPONINI <0.30 <0.30   BNP (last 3 results)  Recent Labs  11/03/13 1442  PROBNP 19644.0*   CBG:  Recent Labs Lab 11/03/13 2204 11/04/13 0618  GLUCAP 198* 98    Recent Results (from the past 240 hour(s))  URINE CULTURE     Status: None   Collection Time    11/01/13  5:25 PM      Result Value Ref Range Status   Colony Count >=100,000 COLONIES/ML   Final   Organism ID, Bacteria Multiple bacterial morphotypes present, none   Final   Organism ID, Bacteria predominant. Suggest appropriate recollection if    Final   Organism ID, Bacteria clinically indicated.   Final     Studies: Dg Chest 2 View  11/03/2013   CLINICAL DATA:  Dry hacking cough. Bilateral leg and feet swelling. Short of breath.  EXAM: CHEST  2 VIEW  COMPARISON:  07/05/2011  FINDINGS: Mild to moderate enlargement of the cardiac silhouette. Aorta is mildly uncoiled. No mediastinal or hilar masses or adenopathy.  There is opacity at the left lung base  silhouetting the left hemidiaphragm. This is consistent with pneumonia. Lungs are hyperexpanded but otherwise clear. No pleural effusions. No pneumothorax.  Bony thorax is diffusely demineralized but grossly intact.  IMPRESSION: Left lower lobe pneumonia.   Electronically Signed   By: Amie Portland M.D.   On: 11/03/2013 15:18   Portable Chest 1 View  11/04/2013   CLINICAL DATA:  Pneumonia.  EXAM: PORTABLE CHEST - 1 VIEW  COMPARISON:  DG CHEST 2 VIEW dated 11/03/2013  FINDINGS: Mediastinum and hilar structures are normal. Left lower lobe infiltrate noted consistent with pneumonia. This has progressed. Small left pleural effusion noted. No pneumothorax. No acute bony abnormality .  IMPRESSION: 1. Left lower lobe infiltrate consistent with pneumonia. Left lower lobe infiltrate has progressed from prior exam. Associated small left pleural effusion is most likely present. 2. Severe cardiomegaly.  Pulmonary vascularity is normal.   Electronically Signed   By: Maisie Fus  Register   On: 11/04/2013 07:42    Scheduled Meds: . aspirin EC  81 mg Oral Daily  . azithromycin  500 mg Oral Daily  . cefTRIAXone (ROCEPHIN)  IV  1 g Intravenous Daily  . cholecalciferol  2,000 Units Oral Daily  . enoxaparin (LOVENOX) injection  30 mg Subcutaneous Q24H  . furosemide  40 mg Intravenous Q12H  . insulin aspart  0-9 Units Subcutaneous TID WC  . sertraline  50 mg Oral Daily  . sodium chloride  3 mL Intravenous Q12H  . sodium chloride  3 mL Intravenous Q12H   Continuous Infusions:    Marinda Elk  [facility] Pager [phone] If 8PM-8AM, please contact night-coverage at [email or web address], password [facility] 11/04/2013, 7:47 AM  LOS: 1 day

## 2013-11-04 NOTE — Progress Notes (Signed)
Pt states TED hose hurting legs and requested that RN removed TED hose.

## 2013-11-04 NOTE — Care Management Note (Addendum)
  Page 1 of 1   11/04/2013     11:28:31 AM   CARE MANAGEMENT NOTE 11/04/2013  Patient:  [name],[name]   Account Number:  [id number]  Date Initiated:  11/04/2013  Documentation initiated by:  Donato SchultzHUTCHINSON,Romesha Scherer  Subjective/Objective Assessment:   Admitted with CHF     Action/Plan:   CM to follow for disposition needs   Anticipated DC Date:  11/07/2013   Anticipated DC Plan:  HOME/SELF CARE         Choice offered to / List presented to:             Status of service:  In process, will continue to follow Medicare Important Message given?   (If response is "NO", the following Medicare IM given date fields will be blank) Date Medicare IM given:   Date Additional Medicare IM given:    Discharge Disposition:    Per UR Regulation:  Reviewed for med. necessity/level of care/duration of stay  If discussed at Long Length of Stay Meetings, dates discussed:    Comments:

## 2013-11-04 NOTE — Progress Notes (Signed)
Echocardiogram 2D Echocardiogram has been performed.  Dorothey BasemanReel, Aleka Twitty M 11/04/2013, 9:03 AM

## 2013-11-04 NOTE — Progress Notes (Signed)
Patient's daughter who has been at bedside since this morning expressed some concern that her mom seems more forgetful today. She would ask the same question 5-10 minutes later. She inquired if it could be related to medications. Upon assessment of patient, she was A/O x4 but admits to possibly feeling anxious due to hospitalization. Speech was clear. Informed patient and family that MD will be notified and we will continue to monitor for further changes.

## 2013-11-05 ENCOUNTER — Encounter (HOSPITAL_COMMUNITY): Payer: Self-pay | Admitting: Cardiology

## 2013-11-05 ENCOUNTER — Ambulatory Visit: Payer: Self-pay | Admitting: Physician Assistant

## 2013-11-05 DIAGNOSIS — N289 Disorder of kidney and ureter, unspecified: Secondary | ICD-10-CM

## 2013-11-05 DIAGNOSIS — I5021 Acute systolic (congestive) heart failure: Secondary | ICD-10-CM

## 2013-11-05 DIAGNOSIS — I5041 Acute combined systolic (congestive) and diastolic (congestive) heart failure: Secondary | ICD-10-CM

## 2013-11-05 LAB — BASIC METABOLIC PANEL
BUN: 25 mg/dL — AB (ref 6–23)
CO2: 34 mEq/L — ABNORMAL HIGH (ref 19–32)
CREATININE: 1.31 mg/dL — AB (ref 0.50–1.10)
Calcium: 9.1 mg/dL (ref 8.4–10.5)
Chloride: 92 mEq/L — ABNORMAL LOW (ref 96–112)
GFR calc Af Amer: 41 mL/min — ABNORMAL LOW (ref >90)
GFR, EST NON AFRICAN AMERICAN: 35 mL/min — AB (ref >90)
GLUCOSE: 123 mg/dL — AB (ref 70–99)
Potassium: 3.1 mEq/L — ABNORMAL LOW (ref 3.7–5.3)
Sodium: 140 mEq/L (ref 137–147)

## 2013-11-05 LAB — GLUCOSE, CAPILLARY
GLUCOSE-CAPILLARY: 134 mg/dL — AB (ref 70–99)
GLUCOSE-CAPILLARY: 179 mg/dL — AB (ref 70–99)
Glucose-Capillary: 60 mg/dL — ABNORMAL LOW (ref 70–99)
Glucose-Capillary: 185 mg/dL — ABNORMAL HIGH (ref 70–99)
Glucose-Capillary: 146 mg/dL — ABNORMAL HIGH (ref 70–99)
Glucose-Capillary: 138 mg/dL — ABNORMAL HIGH (ref 70–99)

## 2013-11-05 LAB — MAGNESIUM: Magnesium: 1.9 mg/dL (ref 1.5–2.5)

## 2013-11-05 MED ORDER — MAGNESIUM OXIDE 400 (241.3 MG) MG PO TABS
400.0000 mg | ORAL_TABLET | Freq: Two times a day (BID) | ORAL | Status: AC
  Administered 2013-11-05 (×2): 400 mg via ORAL
  Filled 2013-11-05 (×2): qty 1

## 2013-11-05 MED ORDER — FUROSEMIDE 10 MG/ML IJ SOLN
40.0000 mg | Freq: Two times a day (BID) | INTRAMUSCULAR | Status: DC
  Administered 2013-11-05 (×2): 40 mg via INTRAVENOUS
  Filled 2013-11-05 (×4): qty 4

## 2013-11-05 MED ORDER — POTASSIUM CHLORIDE CRYS ER 20 MEQ PO TBCR
40.0000 meq | EXTENDED_RELEASE_TABLET | Freq: Two times a day (BID) | ORAL | Status: AC
  Administered 2013-11-05 (×2): 40 meq via ORAL
  Filled 2013-11-05 (×2): qty 2

## 2013-11-05 MED ORDER — POLYETHYLENE GLYCOL 3350 17 G PO PACK
17.0000 g | PACK | Freq: Once | ORAL | Status: DC
  Filled 2013-11-05: qty 1

## 2013-11-05 MED ORDER — LOSARTAN POTASSIUM 25 MG PO TABS
25.0000 mg | ORAL_TABLET | Freq: Every day | ORAL | Status: DC
  Administered 2013-11-05 – 2013-11-06 (×2): 25 mg via ORAL
  Filled 2013-11-05 (×2): qty 1

## 2013-11-05 MED ORDER — ZOLPIDEM TARTRATE 5 MG PO TABS: 5.0000 mg | ORAL_TABLET | Freq: Every evening | ORAL | Status: DC | PRN

## 2013-11-05 MED ORDER — POTASSIUM CHLORIDE 10 MEQ/100ML IV SOLN
10.0000 meq | INTRAVENOUS | Status: AC
  Administered 2013-11-05 (×3): 10 meq via INTRAVENOUS
  Filled 2013-11-05 (×3): qty 100

## 2013-11-05 NOTE — Progress Notes (Signed)
Pt with 2700cc out of foley tonight since 6PM. VSS. Pt on lasix gtt 298ml/hr. Potassium 3.3 yesterday and given PO supplement. Small amount of leakage noted from foley. Benedetto Coons Callahan notified and order for stat BMET.

## 2013-11-05 NOTE — Progress Notes (Signed)
Called to patient's bedside because she was suddenly extremely weak while trying to have a bowel movement, sitting in the bathroom.  Staff assisted patient to bed.  Now back to baseline neuro status and strength.  Possible vagal episode. RN to call if assistance needed.

## 2013-11-05 NOTE — Progress Notes (Signed)
Hypoglycemic Event  CBG: reading stated "too low to read", immediate re-check as 60.  Treatment: 15 GM carbohydrate snack  Symptoms: None  Follow-up CBG: Time:185 CBG Result:0651  Possible Reasons for Event: Unknown  Comments/MD notified: pt easily aroused, oriented x3    Penni Bombardosenberger, Shi Grose A  Remember to initiate Hypoglycemia Order Set & complete

## 2013-11-05 NOTE — Progress Notes (Signed)
Pt potassium 3.1 this AM.  Benedetto Coons Callahan notified, new order for potasium IV runx3 and to keep foley in, if foley in place 16Fr foley. Will continue to monitor. Huel Coventryosenberger, Marai Teehan A, RN

## 2013-11-05 NOTE — Evaluation (Signed)
Physical Therapy Evaluation Patient Details Name: [name] MRN: [id number] DOB: [birth year] Today's Date: 11/05/2013   History of Present Illness  Patient is an [age] yo female admitted with CHF exacerbation with bil. LE edema, L>R, and SOB.  Clinical Impression  Patient presents with problems listed below.  Will benefit from acute PT to maximize independence prior to discharge home with family.    Follow Up Recommendations Home health PT;Supervision/Assistance - 24 hour    Equipment Recommendations  None recommended by PT    Recommendations for Other Services       Precautions / Restrictions Precautions Precautions: Fall Restrictions Weight Bearing Restrictions: No      Mobility  Bed Mobility Overal bed mobility: Needs Assistance Bed Mobility: Supine to Sit;Sit to Supine     Supine to sit: Min guard;HOB elevated (with use of rail) Sit to supine: Min guard;HOB elevated   General bed mobility comments: Verbal cues for technique.  Patient required increased time to get to EOB.  Once upright, patient with good sitting balance.  Sat EOB x 10 minutes.  Transfers Overall transfer level: Needs assistance Equipment used: Rolling walker (2 wheeled) Transfers: Sit to/from Stand Sit to Stand: Min assist         General transfer comment: Verbal cues for hand placement.  Assist to rise to standing and for balance.  Ambulation/Gait Ambulation/Gait assistance: Min assist Ambulation Distance (Feet): 24 Feet Assistive device: Rolling walker (2 wheeled) Gait Pattern/deviations: Step-through pattern;Decreased step length - right;Decreased step length - left;Decreased stride length;Shuffle;Trunk flexed Gait velocity: Slow Gait velocity interpretation: Below normal speed for age/gender General Gait Details: Verbal cues to stay close to RW for safety.  Patient with short shuffling steps.  Posture flexed with head down. Verbal cues to stand upright and look up during  gait.  Stairs            Wheelchair Mobility    Modified Rankin (Stroke Patients Only)       Balance Overall balance assessment: Needs assistance Sitting-balance support: No upper extremity supported;Feet supported Sitting balance-Leahy Scale: Good Sitting balance - Comments: Patient sat EOB x 10 minutes.   Standing balance support: Bilateral upper extremity supported Standing balance-Leahy Scale: Poor Standing balance comment: Bil UE's on RW for support                             Pertinent Vitals/Pain     Home Living Family/patient expects to be discharged to:: Private residence Living Arrangements: Children Available Help at Discharge: Family;Available 24 hours/day (Daughter and son-in-law) Type of Home: House Home Access: Ramped entrance     Home Layout: One level Home Equipment: Walker - 4 wheels;Cane - single point;Shower seat;Toilet riser;Grab bars - tub/shower      Prior Function Level of Independence: Independent with assistive device(s)         Comments: Uses rollator and cane for ambulation.       Hand Dominance        Extremity/Trunk Assessment   Upper Extremity Assessment: RUE deficits/detail;LUE deficits/detail RUE Deficits / Details: Shoulder strength 3-/5.  Shoulder flexion limited to < 90*.  Otherwise WFL.     LUE Deficits / Details: Shoulder strength 3/5.  Shoulder flexion limited to < 90*.  Otherwise WFL.   Lower Extremity Assessment: Generalized weakness (Edema bil, L>R.  History of peripheral neuropathy)      Cervical / Trunk Assessment: Kyphotic  Communication   Communication: HOH (Hearing aids)  Cognition Arousal/Alertness: Awake/alert Behavior During Therapy: WFL for tasks assessed/performed Overall Cognitive Status: Within Functional Limits for tasks assessed                      General Comments General comments (skin integrity, edema, etc.): Edema bil. LE's, with Lt > Rt    Exercises         Assessment/Plan    PT Assessment Patient needs continued PT services  PT Diagnosis Difficulty walking;Abnormality of gait;Generalized weakness;Acute pain   PT Problem List Decreased strength;Decreased activity tolerance;Decreased balance;Decreased mobility;Decreased knowledge of use of DME;Cardiopulmonary status limiting activity;Pain  PT Treatment Interventions DME instruction;Gait training;Functional mobility training;Therapeutic exercise;Patient/family education   PT Goals (Current goals can be found in the Care Plan section) Acute Rehab PT Goals Patient Stated Goal: To return home PT Goal Formulation: With patient/family Time For Goal Achievement: 11/12/13 Potential to Achieve Goals: Good    Frequency Min 3X/week   Barriers to discharge        Co-evaluation               End of Session Equipment Utilized During Treatment: Gait belt Activity Tolerance: Patient limited by fatigue Patient left: in bed;with call bell/phone within reach;with family/visitor present;with bed alarm set Nurse Communication: Mobility status         Time: 1610-9604 PT Time Calculation (min): 33 min   Charges:   PT Evaluation $Initial PT Evaluation Tier I: 1 Procedure PT Treatments $Gait Training: 8-22 mins $Therapeutic Activity: 8-22 mins   PT G Codes:          Vena Austria 11/05/2013, 2:01 PM Durenda Hurt. Renaldo Fiddler, [facility] Acute Rehab Services Pager [phone]

## 2013-11-05 NOTE — Progress Notes (Signed)
i cosign with Demarcus Steward on all assessments, medication administration and documentation for this shift.  

## 2013-11-05 NOTE — Consult Note (Signed)
HPI: 80 year old female with past medical history of chronic renal insufficiency, hypertension, hyperlipidemia and diabetes mellitus for evaluation of acute systolic congestive heart failure. Patient is somewhat frail and has chronic mild dyspnea on exertion. However over the past 2-4 weeks she has noticed progressive dyspnea on exertion and bilateral pedal edema left greater than right. She has had a dry cough. She has had orthopnea but no PND. No chest pain. No fevers, chills or hemoptysis. She is also noticed weakness. Echocardiogram shows severe systolic dysfunction and cardiology is asked to evaluate. She has been treated with antibiotics and diuretics and has had some improvement. Note she is a no CODE BLUE.  Medications Prior to Admission  Medication Sig Dispense Refill  . acetaminophen (TYLENOL) 325 MG tablet Take 650 mg by mouth every 6 (six) hours as needed for mild pain.       . B Complex Vitamins (VITAMIN-B COMPLEX PO) Take 1 tablet by mouth daily.       . Cholecalciferol (VITAMIN D) 2000 UNITS CAPS Take 2,000 Units by mouth daily.      . furosemide (LASIX) 40 MG tablet Take 40 mg by mouth 2 (two) times daily.      Marland Kitchen MAGNESIUM PO Take 1 tablet by mouth daily.       . Omega-3 Fatty Acids (FISH OIL PO) Take 1 capsule by mouth daily.       Marland Kitchen OVER THE COUNTER MEDICATION Take 1 tablet by mouth daily. Tumeric      . OVER THE COUNTER MEDICATION Take 1 tablet by mouth daily. Herbavision-Lutin-Bilberry      . Probiotic Product (PROBIOTIC DAILY PO) Take 1 tablet by mouth daily.       . sertraline (ZOLOFT) 100 MG tablet Take 50 mg by mouth daily.       Marland Kitchen triamterene-hydrochlorothiazide (DYAZIDE) 37.5-25 MG per capsule Take 1 capsule by mouth daily.       Marland Kitchen VITAMIN K PO Take 1 tablet by mouth daily.         Allergies  Allergen Reactions  . Ace Inhibitors   . Codeine     dysuria  . Lisinopril Swelling  . Morphine And Related     dysuria  . Percocet [Oxycodone-Acetaminophen]    dysuria  . Vitamin C     dysuria  . Vitamin D Analogs     Large doses cause dysuria    Past Medical History  Diagnosis Date  . Hyperlipidemia   . Hypertension   . Type II or unspecified type diabetes mellitus without mention of complication, not stated as uncontrolled   . Diabetic neuropathy   . Chronic kidney disease   . GERD (gastroesophageal reflux disease)   . Osteoporosis     Past Surgical History  Procedure Laterality Date  . Nephrectomy Right 1959  . Breast lumpectomy      [phone]  . Bladder surgery  1984  . Colostomy  2004    Reversed 2005  . Eye surgery  2008 and 2009    History   Social History  . Marital Status: Widowed    Spouse Name: N/A    Number of Children: 2  . Years of Education: N/A   Occupational History  . Not on file.   Social History Main Topics  . Smoking status: Never Smoker   . Smokeless tobacco: Not on file  . Alcohol Use: Yes     Comment: Occ wine states she is going to stop  . Drug  Use: No  . Sexual Activity: No   Other Topics Concern  . Not on file   Social History Narrative  . No narrative on file    Family History  Problem Relation Age of Onset  . Heart disease Father   . Heart disease Sister   . Diabetes Daughter   . Hypertension Son     ROS:  Nonproductive cough and weakness but no fevers or chills, hemoptysis, dysphasia, odynophagia, melena, hematochezia, dysuria, hematuria, rash, seizure activity, claudication. Remaining systems are negative.  Physical Exam:   Blood pressure 131/51, pulse 76, temperature 97.4 F (36.3 C), temperature source Oral, resp. rate 17, height _0  (1.6 m), weight 146 lb 2.6 oz (66.3 kg), SpO2 90.00%.  General:  Well developed/frail in NAD Skin warm/dry Patient not depressed No peripheral clubbing Back-normal HEENT-normal/normal eyelids Neck supple/normal carotid upstroke bilaterally; no bruits; no JVD; no thyromegaly chest - CTA/ normal expansion CV - RRR/normal S1 and  S2; no murmurs, rubs or gallops;  PMI nondisplaced Abdomen -NT/ND, no HSM, no mass, + bowel sounds, no bruit 2+ femoral pulses, no bruits Ext-trace edema, no chords, 2+ DP, mild erythema and excoriations. Neuro-grossly nonfocal  ECG sinus rhythm, left bundle branch block.  Results for orders placed during the hospital encounter of 11/03/13 (from the past 48 hour(s))  CBC     Status: Abnormal   Collection Time    11/03/13  2:42 PM      Result Value Ref Range   WBC 4.1  4.0 - 10.5 K/uL   RBC 4.73  3.87 - 5.11 MIL/uL   Hemoglobin 15.4 (*) 12.0 - 15.0 g/dL   HCT 44.7  36.0 - 46.0 %   MCV 94.5  78.0 - 100.0 fL   MCH 32.6  26.0 - 34.0 pg   MCHC 34.5  30.0 - 36.0 g/dL   RDW 15.3  11.5 - 15.5 %   Platelets 177  150 - 400 K/uL  BASIC METABOLIC PANEL     Status: Abnormal   Collection Time    11/03/13  2:42 PM      Result Value Ref Range   Sodium 141  137 - 147 mEq/L   Potassium 3.8  3.7 - 5.3 mEq/L   Chloride 95 (*) 96 - 112 mEq/L   CO2 30  19 - 32 mEq/L   Glucose, Bld 104 (*) 70 - 99 mg/dL   BUN 24 (*) 6 - 23 mg/dL   Creatinine, Ser 1.62 (*) 0.50 - 1.10 mg/dL   Calcium 8.6  8.4 - 10.5 mg/dL   GFR calc non Af Amer 27 (*) >90 mL/min   GFR calc Af Amer 31 (*) >90 mL/min   Comment: (NOTE)     The eGFR has been calculated using the CKD EPI equation.     This calculation has not been validated in all clinical situations.     eGFR's persistently <90 mL/min signify possible Chronic Kidney     Disease.  PRO B NATRIURETIC PEPTIDE     Status: Abnormal   Collection Time    11/03/13  2:42 PM      Result Value Ref Range   Pro B Natriuretic peptide (BNP) 19644.0 (*) 0 - 450 pg/mL  URINALYSIS, ROUTINE W REFLEX MICROSCOPIC     Status: None   Collection Time    11/03/13  5:04 PM      Result Value Ref Range   Color, Urine YELLOW  YELLOW   APPearance CLEAR  CLEAR  Specific Gravity, Urine 1.012  1.005 - 1.030   pH 7.0  5.0 - 8.0   Glucose, UA NEGATIVE  NEGATIVE mg/dL   Hgb urine dipstick  NEGATIVE  NEGATIVE   Bilirubin Urine NEGATIVE  NEGATIVE   Ketones, ur NEGATIVE  NEGATIVE mg/dL   Protein, ur NEGATIVE  NEGATIVE mg/dL   Urobilinogen, UA 0.2  0.0 - 1.0 mg/dL   Nitrite NEGATIVE  NEGATIVE   Leukocytes, UA NEGATIVE  NEGATIVE   Comment: MICROSCOPIC NOT DONE ON URINES WITH NEGATIVE PROTEIN, BLOOD, LEUKOCYTES, NITRITE, OR GLUCOSE <1000 mg/dL.  LEGIONELLA ANTIGEN, URINE     Status: None   Collection Time    11/03/13  8:59 PM      Result Value Ref Range   Specimen Description URINE, CLEAN CATCH     Special Requests NONE     Legionella Antigen, Urine       Value: Negative for Legionella pneumophilia serogroup 1     Performed at [facility]   Report Status 11/04/2013 FINAL    STREP PNEUMONIAE URINARY ANTIGEN     Status: None   Collection Time    11/03/13  8:59 PM      Result Value Ref Range   Strep Pneumo Urinary Antigen NEGATIVE  NEGATIVE   Comment:            Infection due to S. pneumoniae     cannot be absolutely ruled out     since the antigen present     may be below the detection limit     of the test.  TROPONIN I     Status: None   Collection Time    11/03/13  9:06 PM      Result Value Ref Range   Troponin I <0.30  <0.30 ng/mL   Comment:            Due to the release kinetics of cTnI,     a negative result within the first hours     of the onset of symptoms does not rule out     myocardial infarction with certainty.     If myocardial infarction is still suspected,     repeat the test at appropriate intervals.  TSH     Status: None   Collection Time    11/03/13  9:06 PM      Result Value Ref Range   TSH 3.400  0.350 - 4.500 uIU/mL   Comment: Please note change in reference range.  HEMOGLOBIN A1C     Status: Abnormal   Collection Time    11/03/13  9:06 PM      Result Value Ref Range   Hemoglobin A1C 6.6 (*) <5.7 %   Comment: (NOTE)                                                                               According to the ADA Clinical  Practice Recommendations for 2011, when     HbA1c is used as a screening test:      >=6.5%   Diagnostic of Diabetes Mellitus               (if abnormal result is confirmed)  5.7-6.4%   Increased risk of developing Diabetes Mellitus     References:Diagnosis and Classification of Diabetes Mellitus,Diabetes     IRSW,5462,70(JJKKX 1):S62-S69 and Standards of Medical Care in             Diabetes - 2011,Diabetes FGHW,2993,71 (Suppl 1):S11-S61.   Mean Plasma Glucose 143 (*) <117 mg/dL   Comment: Performed at [place], [facility]     Status: Abnormal   Collection Time    11/03/13 10:04 PM      Result Value Ref Range   Glucose-Capillary 198 (*) 70 - 99 mg/dL  TROPONIN I     Status: None   Collection Time    11/04/13  1:42 AM      Result Value Ref Range   Troponin I <0.30  <0.30 ng/mL   Comment:            Due to the release kinetics of cTnI,     a negative result within the first hours     of the onset of symptoms does not rule out     myocardial infarction with certainty.     If myocardial infarction is still suspected,     repeat the test at appropriate intervals.  BASIC METABOLIC PANEL     Status: Abnormal   Collection Time    11/04/13  1:42 AM      Result Value Ref Range   Sodium 139  137 - 147 mEq/L   Potassium 3.3 (*) 3.7 - 5.3 mEq/L   Chloride 96  96 - 112 mEq/L   CO2 26  19 - 32 mEq/L   Glucose, Bld 99  70 - 99 mg/dL   BUN 29 (*) 6 - 23 mg/dL   Creatinine, Ser 1.57 (*) 0.50 - 1.10 mg/dL   Calcium 8.2 (*) 8.4 - 10.5 mg/dL   GFR calc non Af Amer 28 (*) >90 mL/min   GFR calc Af Amer 33 (*) >90 mL/min   Comment: (NOTE)     The eGFR has been calculated using the CKD EPI equation.     This calculation has not been validated in all clinical situations.     eGFR's persistently <90 mL/min signify possible Chronic Kidney     Disease.  GLUCOSE, CAPILLARY     Status: None   Collection Time    11/04/13  6:18 AM      Result Value Ref Range   Glucose-Capillary  98  70 - 99 mg/dL  TROPONIN I     Status: None   Collection Time    11/04/13  9:20 AM      Result Value Ref Range   Troponin I <0.30  <0.30 ng/mL   Comment:            Due to the release kinetics of cTnI,     a negative result within the first hours     of the onset of symptoms does not rule out     myocardial infarction with certainty.     If myocardial infarction is still suspected,     repeat the test at appropriate intervals.  GLUCOSE, CAPILL[facility]     Status: Abnormal   Collection Time    11/04/13 11:11 AM      Result Value Ref Range   Glucose-Capillary 144 (*) 70 - 99 mg/dL  GLUCOSE, CAPILLARY     Status: Abnormal   Collection Time    11/04/13  4:25 PM      Result Value  Ref Range   Glucose-Capillary 113 (*) 70 - 99 mg/dL  GLUCOSE, CAPILLARY     Status: Abnormal   Collection Time    11/04/13  9:29 PM      Result Value Ref Range   Glucose-Capillary 123 (*) 70 - 99 mg/dL   Comment 1 Documented in Chart     Comment 2 Notify RN    BASIC METABOLIC PANEL     Status: Abnormal   Collection Time    11/05/13  4:00 AM      Result Value Ref Range   Sodium 140  137 - 147 mEq/L   Potassium 3.1 (*) 3.7 - 5.3 mEq/L   Chloride 92 (*) 96 - 112 mEq/L   CO2 34 (*) 19 - 32 mEq/L   Glucose, Bld 123 (*) 70 - 99 mg/dL   BUN 25 (*) 6 - 23 mg/dL   Creatinine, Ser 1.31 (*) 0.50 - 1.10 mg/dL   Calcium 9.1  8.4 - 10.5 mg/dL   GFR calc non Af Amer 35 (*) >90 mL/min   GFR calc Af Amer 41 (*) >90 mL/min   Comment: (NOTE)     The eGFR has been calculated using the CKD EPI equation.     This calculation has not been validated in all clinical situations.     eGFR's persistently <90 mL/min signify possible Chronic Kidney     Disease.  MAGNESIUM     Status: None   Collection Time    11/05/13  4:00 AM      Result Value Ref Range   Magnesium 1.9  1.5 - 2.5 mg/dL  GLUCOSE, CAPILLARY     Status: Abnormal   Collection Time    11/05/13  6:05 AM      Result Value Ref Range   Glucose-Capillary <10 (*)  70 - 99 mg/dL  GLUCOSE, CAPILLARY     Status: Abnormal   Collection Time    11/05/13  6:10 AM      Result Value Ref Range   Glucose-Capillary 60 (*) 70 - 99 mg/dL  GLUCOSE, CAPILLARY     Status: Abnormal   Collection Time    11/05/13  6:51 AM      Result Value Ref Range   Glucose-Capillary 185 (*) 70 - 99 mg/dL    Dg Chest 2 View  11/03/2013   CLINICAL DATA:  Dry hacking cough. Bilateral leg and feet swelling. Short of breath.  EXAM: CHEST  2 VIEW  COMPARISON:  07/05/2011  FINDINGS: Mild to moderate enlargement of the cardiac silhouette. Aorta is mildly uncoiled. No mediastinal or hilar masses or adenopathy.  There is opacity at the left lung base silhouetting the left hemidiaphragm. This is consistent with pneumonia. Lungs are hyperexpanded but otherwise clear. No pleural effusions. No pneumothorax.  Bony thorax is diffusely demineralized but grossly intact.  IMPRESSION: Left lower lobe pneumonia.   Electronically Signed   By: Lajean Manes M.D.   On: 11/03/2013 15:18   Portable Chest 1 View  11/04/2013   CLINICAL DATA:  Pneumonia.  EXAM: PORTABLE CHEST - 1 VIEW  COMPARISON:  DG CHEST 2 VIEW dated 11/03/2013  FINDINGS: Mediastinum and hilar structures are normal. Left lower lobe infiltrate noted consistent with pneumonia. This has progressed. Small left pleural effusion noted. No pneumothorax. No acute bony abnormality .  IMPRESSION: 1. Left lower lobe infiltrate consistent with pneumonia. Left lower lobe infiltrate has progressed from prior exam. Associated small left pleural effusion is most likely present. 2. Severe cardiomegaly.  Pulmonary vascularity is normal.   Electronically Signed   By: Marcello Moores  Register   On: 11/04/2013 07:42    Assessment/Plan 1 acute systolic congestive heart failure-echocardiogram reveals severe LV dysfunction with an ejection fraction of 25-30%, moderate mitral regurgitation and severely elevated pulmonary pressures. Volume status has improved. Discontinue Lasix drip.  Begin Lasix 40 mg IV twice a day. Follow renal function closely given history of nephrectomy and baseline renal insufficiency. I will add Cozaar 25 mg daily to see she tolerates. Lisinopril is listed as an allergy but patient does not recall any reaction to this medication. We will need to follow renal function closely and discontinue if her creatinine increases. I will add low-dose carvedilol later as blood pressure allows. Note TSH normal. Long discussion with patient today concerning aggressiveness of evaluation. She will be 80 years old in 3 weeks. She is a no CODE BLUE. She does not want aggressive procedures including cardiac catheterization. She wants medical therapy. I think this is reasonable. Titrate medications as tolerated. 2 acute on chronic kidney disease-history of nephrectomy. Follow renal function closely with diuretics and ARB. 3 pneumonia-management per primary care.  Kirk Ruths MD 11/05/2013, 8:47 AM

## 2013-11-05 NOTE — Progress Notes (Signed)
11/05/13 0700-1900 Pt. A/Ox3 and is forgetful at times. She is 2 person assist. She had no c/o pain. Pt.had a suspected vagal episode in the afternoon. Was told be NS that patient's daughter was requesting someone to come to the room immediately. Pt.was on the bathroom commode with walker present. Pt.'s daughter said she believed her mother was less responsive, was weak and couldn't raise her hands.Rapid response team was called for possible Code Stroke. Patient was assisted back to bed by staff. VS were taken. MD called and notified. Pt.was assessed to returned back to her baseline cognition and alertness. New orders were written. Pt.had received prn medication for c/o constipation earlier in the am. Patient had 1 large soft bowel movement later in the afternoon. Pt.had additional BM's after that and request that no more prn medications for bowel movements be given.

## 2013-11-05 NOTE — Progress Notes (Addendum)
[facility] PROGRESS NOTE Interim History: 80 y.o. female with h/o HTN, CKD-last cr 1.13 in 08/2013>>1.36 on 3/30, GERD with chronic cough, DM on no meds who presents with the above complaints. She states that over the past month she has had worsening of leg swelling and DOE.she went to see her PCP 2days ago and labs revealed elevated BNP of about 2821. She was started on lasix 40bid, but her symptoms were continuing to worsen and so she was referrred to the ED for admission.  Filed Weights   11/03/13 1437 11/04/13 0540 11/05/13 0500  Weight: 75.297 kg (166 lb) 68.7 kg (151 lb 7.3 oz) 66.3 kg (146 lb 2.6 oz)        Intake/Output Summary (Last 24 hours) at 11/05/13 0733 Last data filed at 11/05/13 0640  Gross per 24 hour  Intake 1527.06 ml  Output   6350 ml  Net -4822.94 ml     Assessment/Plan: Systolic and diastolic heart failure, acute/cardiorenal syndrome: - Cont lasix infusion at a lower rate,  good UOP. Place ted hose. - Cr. Improving. Replete electrolytes. - Fluid restrict, strict I and O's. - Echo 4.2.2015 as below, consult cardiology for ischemic evaluation.  CAP: - Rocephin and azithromycin started on 4.2.2015. De escalate to [employer]. - Afebrile. Afebrile no leucocytosis.  Acute on chronic renal failure(solitary kidney): - Improving with diuresis.  GERD (gastroesophageal reflux disease) - Protonix  Hypertension - stable.  Type II or unspecified type diabetes mellitus without mention of complication, not stated as uncontrolled - HbgA1c 6.6 - had episode of low BG.  Code Status: full  Family Communication: daughter at bedside  Disposition Plan: admit to tele   Consultants:  none  Procedures: ECHO 4.2.2015: estimated ejection fraction was in the range of 25% to 30%. Diffuse hypokinesis. Doppler parameters are consistent with abnormal left ventricular relaxation (grade 1 diastolic dysfunction)   Antibiotics:  None  HPI/Subjective: SOB  improved.  Objective: Filed Vitals:   11/04/13 1359 11/04/13 2041 11/05/13 0157 11/05/13 0500  BP: 105/45 131/54 130/49 131/51  Pulse: 79 81 72 76  Temp: 97.9 F (36.6 C) 97.7 F (36.5 C) 97.6 F (36.4 C) 97.4 F (36.3 C)  TempSrc: Oral Oral Oral Oral  Resp: 18 18 16 17   Height:      Weight:    66.3 kg (146 lb 2.6 oz)  SpO2: 92% 94% 94% 90%     Exam:  General: Alert, awake, oriented x3, in no acute distress.  HEENT: No bruits, no goiter. +JVD Heart: Regular rate and rhythm, 1+ edema. Lungs: Good air movement, right lower lobe crackles. Abdomen: Soft, nontender, nondistended, positive bowel sounds.   Data Reviewed: Basic Metabolic Panel:  Recent Labs Lab 11/01/13 1725 11/03/13 1442 11/04/13 0142 11/05/13 0400  NA 137 141 139 140  K 4.3 3.8 3.3* 3.1*  CL 96 95* 96 92*  CO2 29 30 26  34*  GLUCOSE 95 104* 99 123*  BUN 21 24* 29* 25*  CREATININE 1.36* 1.62* 1.57* 1.31*  CALCIUM 9.0 8.6 8.2* 9.1  MG  --   --   --  1.9   Liver Function Tests:  Recent Labs Lab 11/01/13 1725  AST 24  ALT 14  ALKPHOS 45  BILITOT 0.9  PROT 6.5  ALBUMIN 4.1   No results found for this basename: LIPASE, AMYLASE,  in the last 168 hours No results found for this basename: AMMONIA,  in the last 168 hours CBC:  Recent Labs Lab 11/01/13 1725 11/03/13 1442  WBC 4.8 4.1  NEUTROABS 3.2  --   HGB 15.6* 15.4*  HCT 46.7* 44.7  MCV 93.6 94.5  PLT 206 177   Cardiac Enzymes:  Recent Labs Lab 11/03/13 2106 11/04/13 0142 11/04/13 0920  TROPONINI <0.30 <0.30 <0.30   BNP (last 3 results)  Recent Labs  11/03/13 1442  PROBNP 19644.0*   CBG:  Recent Labs Lab 11/04/13 1625 11/04/13 2129 11/05/13 0605 11/05/13 0610 11/05/13 0651  GLUCAP 113* 123* <10* 60* 185*    Recent Results (from the past 240 hour(s))  URINE CULTURE     Status: None   Collection Time    11/01/13  5:25 PM      Result Value Ref Range Status   Colony Count >=100,000 COLONIES/ML   Final    Organism ID, Bacteria Multiple bacterial morphotypes present, none   Final   Organism ID, Bacteria predominant. Suggest appropriate recollection if    Final   Organism ID, Bacteria clinically indicated.   Final     Studies: Dg Chest 2 View  11/03/2013   CLINICAL DATA:  Dry hacking cough. Bilateral leg and feet swelling. Short of breath.  EXAM: CHEST  2 VIEW  COMPARISON:  07/05/2011  FINDINGS: Mild to moderate enlargement of the cardiac silhouette. Aorta is mildly uncoiled. No mediastinal or hilar masses or adenopathy.  There is opacity at the left lung base silhouetting the left hemidiaphragm. This is consistent with pneumonia. Lungs are hyperexpanded but otherwise clear. No pleural effusions. No pneumothorax.  Bony thorax is diffusely demineralized but grossly intact.  IMPRESSION: Left lower lobe pneumonia.   Electronically Signed   By: Amie Portlandavid  Ormond M.D.   On: 11/03/2013 15:18   Portable Chest 1 View  11/04/2013   CLINICAL DATA:  Pneumonia.  EXAM: PORTABLE CHEST - 1 VIEW  COMPARISON:  DG CHEST 2 VIEW dated 11/03/2013  FINDINGS: Mediastinum and hilar structures are normal. Left lower lobe infiltrate noted consistent with pneumonia. This has progressed. Small left pleural effusion noted. No pneumothorax. No acute bony abnormality .  IMPRESSION: 1. Left lower lobe infiltrate consistent with pneumonia. Left lower lobe infiltrate has progressed from prior exam. Associated small left pleural effusion is most likely present. 2. Severe cardiomegaly.  Pulmonary vascularity is normal.   Electronically Signed   By: Maisie Fushomas  Register   On: 11/04/2013 07:42    Scheduled Meds: . aspirin EC  81 mg Oral Daily  . azithromycin  500 mg Oral Daily  . cefTRIAXone (ROCEPHIN)  IV  1 g Intravenous Daily  . cholecalciferol  2,000 Units Oral Daily  . enoxaparin (LOVENOX) injection  30 mg Subcutaneous Q24H  . insulin aspart  0-9 Units Subcutaneous TID WC  . potassium chloride  10 mEq Intravenous Q1 Hr x 3  . sertraline  50  mg Oral Daily   Continuous Infusions: . furosemide (LASIX) infusion 8 mg/hr (11/04/13 1900)     Radonna RickerFELIZ Rosine BeatTIZ, Milbern Doescher  [facility] Pager [phone] If 8PM-8AM, please contact night-coverage at [email or web address], password [email or web address] 11/05/2013, 7:33 AM  LOS: 2 days

## 2013-11-06 LAB — BASIC METABOLIC PANEL
BUN: 35 mg/dL — ABNORMAL HIGH (ref 6–23)
CO2: 33 meq/L — AB (ref 19–32)
Calcium: 8.6 mg/dL (ref 8.4–10.5)
Chloride: 93 mEq/L — ABNORMAL LOW (ref 96–112)
Creatinine, Ser: 1.65 mg/dL — ABNORMAL HIGH (ref 0.50–1.10)
GFR calc Af Amer: 31 mL/min — ABNORMAL LOW (ref >90)
GFR calc non Af Amer: 26 mL/min — ABNORMAL LOW (ref >90)
GLUCOSE: 115 mg/dL — AB (ref 70–99)
POTASSIUM: 4.2 meq/L (ref 3.7–5.3)
Sodium: 142 mEq/L (ref 137–147)

## 2013-11-06 LAB — GLUCOSE, CAPILLARY
GLUCOSE-CAPILLARY: 106 mg/dL — AB (ref 70–99)
GLUCOSE-CAPILLARY: 199 mg/dL — AB (ref 70–99)
GLUCOSE-CAPILLARY: 126 mg/dL — AB (ref 70–99)
Glucose-Capillary: 135 mg/dL — ABNORMAL HIGH (ref 70–99)

## 2013-11-06 MED ORDER — FUROSEMIDE 20 MG PO TABS
20.0000 mg | ORAL_TABLET | Freq: Two times a day (BID) | ORAL | Status: DC
  Administered 2013-11-06 (×2): 20 mg via ORAL
  Filled 2013-11-06 (×5): qty 1

## 2013-11-06 MED ORDER — CARVEDILOL 3.125 MG PO TABS
3.1250 mg | ORAL_TABLET | Freq: Two times a day (BID) | ORAL | Status: DC
  Administered 2013-11-06 – 2013-11-07 (×3): 3.125 mg via ORAL
  Filled 2013-11-06 (×5): qty 1

## 2013-11-06 NOTE — Progress Notes (Signed)
Physical Therapy Treatment Patient Details Name: Ashley ShaggyVirginia H Votta MRN: [id number] DOB: Aug 16, 1921 Today's Date: 11/06/2013    History of Present Illness Patient is an 80 yo female admitted with CHF exacerbation with bil. LE edema, L>R, and SOB.    PT Comments    Patient motivated to work with PT - wants to go home.  Improved mobility and gait.  Was incontinent of bowel during session - cleaned.    Follow Up Recommendations  Home health PT;Supervision/Assistance - 24 hour     Equipment Recommendations  None recommended by PT    Recommendations for Other Services       Precautions / Restrictions Precautions Precautions: Fall Restrictions Weight Bearing Restrictions: No    Mobility  Bed Mobility Overal bed mobility: Needs Assistance Bed Mobility: Supine to Sit     Supine to sit: Min guard;HOB elevated     General bed mobility comments: Patient able to move to EOB with increased time and no physical assist.  Able to scoot to EOB with min guard assist.  Transfers Overall transfer level: Needs assistance Equipment used: Rolling walker (2 wheeled) Transfers: Sit to/from Stand Sit to Stand: Min assist         General transfer comment: Patient used correct hand placement.  Patient able to rise to standing with min assist for balance/safety only.  Ambulation/Gait Ambulation/Gait assistance: Min guard Ambulation Distance (Feet): 52 Feet Assistive device: Rolling walker (2 wheeled) Gait Pattern/deviations: Step-through pattern;Decreased step length - right;Decreased step length - left;Decreased stride length;Trunk flexed (Forward head) Gait velocity: Slow Gait velocity interpretation: Below normal speed for age/gender General Gait Details: Cues to stand upright and look forward during gait.  Patient with increased gait speed today.  At end of ambulation, patient was incontinent of bowel.  Patient cleaned and sat in chair.  RN notified.   Stairs             Wheelchair Mobility    Modified Rankin (Stroke Patients Only)       Balance                                    Cognition Arousal/Alertness: Awake/alert Behavior During Therapy: WFL for tasks assessed/performed Overall Cognitive Status: Within Functional Limits for tasks assessed                      Exercises      General Comments        Pertinent Vitals/Pain Patient c/o 6/10 neck pain with ambulation - forward head.  Improved once in chair.    Home Living                      Prior Function            PT Goals (current goals can now be found in the care plan section) Progress towards PT goals: Progressing toward goals    Frequency  Min 3X/week    PT Plan Current plan remains appropriate    Co-evaluation             End of Session Equipment Utilized During Treatment: Gait belt Activity Tolerance: Patient limited by pain;Patient limited by fatigue Patient left: in chair;with call bell/phone within reach;with family/visitor present     Time: [id number] PT Time Calculation (min): 26 min  Charges:  $Gait Training: 23-37 mins  G Codes:      Vena Austria 11/06/2013, 10:12 AM Durenda Hurt. Renaldo Fiddler, [facility] Pager [phone]

## 2013-11-06 NOTE — Progress Notes (Signed)
Patient iv came out. Md said it is ok to leave the iv out that patient will be d/c in am. Will continue to monitor.

## 2013-11-06 NOTE — Progress Notes (Addendum)
Feet elevated on pillows with heels off bed.  IV site unremarkable.  Incontinent of stool in floor.

## 2013-11-06 NOTE — Progress Notes (Signed)
[facility] PROGRESS NOTE Interim History: 80 y.o. female with h/o HTN, CKD-last cr 1.13 in 08/2013>>1.36 on 3/30, GERD with chronic cough, DM on no meds who presents with the above complaints. She states that over the past month she has had worsening of leg swelling and DOE.she went to see her PCP 2days ago and labs revealed elevated BNP of about 2821. She was started on lasix 40bid, but her symptoms were continuing to worsen and so she was referrred to the ED for admission.  Filed Weights   11/04/13 0540 11/05/13 0500 11/06/13 0500  Weight: 68.7 kg (151 lb 7.3 oz) 66.3 kg (146 lb 2.6 oz) 62.6 kg (138 lb 0.1 oz)        Intake/Output Summary (Last 24 hours) at 11/06/13 0801 Last data filed at 11/06/13 0600  Gross per 24 hour  Intake 1070.5 ml  Output   2375 ml  Net -1304.5 ml     Assessment/Plan: Systolic and diastolic heart failure, acute/cardiorenal syndrome: - Change lasix to oral,  Mild increase in Cr. No JVD ot PND. - Cr. Improving. Replete electrolytes. - Fluid restrict, strict I and O's. - Echo 4.2.2015 as below. - tolerate ARB-II well. HR is slowing down ? If benefits form BB.  CAP: - Rocephin and azithromycin started on 4.2.2015. De escalate to [employer]. - Afebrile. Afebrile no leucocytosis.  Acute on chronic renal failure(solitary kidney): - Improving with diuresis.  GERD (gastroesophageal reflux disease) - Protonix  Hypertension - stable.  Type II or unspecified type diabetes mellitus without mention of complication, not stated as uncontrolled - HbgA1c 6.6 - had episode of low BG.  Code Status: full  Family Communication: daughter at bedside  Disposition Plan: admit to tele   Consultants:  none  Procedures: ECHO 4.2.2015: estimated ejection fraction was in the range of 25% to 30%. Diffuse hypokinesis. Doppler parameters are consistent with abnormal left ventricular relaxation (grade 1 diastolic  dysfunction)   Antibiotics:  None  HPI/Subjective: SOB improved.  Objective: Filed Vitals:   11/05/13 1400 11/05/13 2043 11/06/13 0500 11/06/13 0648  BP: 92/43 122/60 104/32 120/50  Pulse: 67 67    Temp: 97.6 F (36.4 C) 97.8 F (36.6 C) 97.4 F (36.3 C)   TempSrc: Oral Oral Oral   Resp: 18 17 18    Height:      Weight:   62.6 kg (138 lb 0.1 oz)   SpO2: 97% 97% 100%      Exam:  General: Alert, awake, oriented x3, in no acute distress.  HEENT: No bruits, no goiter. - JVD Heart: Regular rate and rhythm, no lower ext. edema. Lungs: Good air movement, right lower lobe crackles. Abdomen: Soft, nontender, nondistended, positive bowel sounds.   Data Reviewed: Basic Metabolic Panel:  Recent Labs Lab 11/01/13 1725 11/03/13 1442 11/04/13 0142 11/05/13 0400 11/06/13 0335  NA 137 141 139 140 142  K 4.3 3.8 3.3* 3.1* 4.2  CL 96 95* 96 92* 93*  CO2 29 30 26  34* 33*  GLUCOSE 95 104* 99 123* 115*  BUN 21 24* 29* 25* 35*  CREATININE 1.36* 1.62* 1.57* 1.31* 1.65*  CALCIUM 9.0 8.6 8.2* 9.1 8.6  MG  --   --   --  1.9  --    Liver Function Tests:  Recent Labs Lab 11/01/13 1725  AST 24  ALT 14  ALKPHOS 45  BILITOT 0.9  PROT 6.5  ALBUMIN 4.1   No results found for this basename: LIPASE, AMYLASE,  in the last 168  hours No results found for this basename: AMMONIA,  in the last 168 hours CBC:  Recent Labs Lab 11/01/13 1725 11/03/13 1442  WBC 4.8 4.1  NEUTROABS 3.2  --   HGB 15.6* 15.4*  HCT 46.7* 44.7  MCV 93.6 94.5  PLT 206 177   Cardiac Enzymes:  Recent Labs Lab 11/03/13 2106 11/04/13 0142 11/04/13 0920  TROPONINI <0.30 <0.30 <0.30   BNP (last 3 results)  Recent Labs  11/03/13 1442  PROBNP 19644.0*   CBG:  Recent Labs Lab 11/05/13 1100 11/05/13 1323 11/05/13 1641 11/05/13 2110 11/06/13 0609  GLUCAP 134* 179* 146* 138* 106*    Recent Results (from the past 240 hour(s))  URINE CULTURE     Status: None   Collection Time    11/01/13   5:25 PM      Result Value Ref Range Status   Colony Count >=100,000 COLONIES/ML   Final   Organism ID, Bacteria Multiple bacterial morphotypes present, none   Final   Organism ID, Bacteria predominant. Suggest appropriate recollection if    Final   Organism ID, Bacteria clinically indicated.   Final     Studies: No results found.  Scheduled Meds: . aspirin EC  81 mg Oral Daily  . azithromycin  500 mg Oral Daily  . cholecalciferol  2,000 Units Oral Daily  . enoxaparin (LOVENOX) injection  30 mg Subcutaneous Q24H  . furosemide  40 mg Intravenous BID  . losartan  25 mg Oral Daily  . polyethylene glycol  17 g Oral Once  . sertraline  50 mg Oral Daily   Continuous Infusions:     [name], ABRAHAM  [facility] Pager [phone] If 8PM-8AM, please contact night-coverage at [email or web address], password [email or web address] 11/06/2013, 8:01 AM  LOS: 3 days

## 2013-11-06 NOTE — Plan of Care (Signed)
Problem: Phase I Progression Outcomes Goal: EF % per last Echo/documented,Core Reminder form on chart Outcome: Completed/Met Date Met:  11/06/13 EF 25-30%(11-04-13)

## 2013-11-06 NOTE — Progress Notes (Signed)
Patient ID: Bebe ShaggyVirginia H Behney, female   DOB: Dec 06, 1921, 80 y.o.   MRN: [id number]      Subjective:    SOB improving   Objective:   Temp:  [97.4 F (36.3 C)-97.8 F (36.6 C)] 97.4 F (36.3 C) (04/04 0500) Pulse Rate:  [67-76] 67 (04/03 2043) Resp:  [17-18] 18 (04/04 0500) BP: (92-122)/(32-60) 120/50 mmHg (04/04 0648) SpO2:  [95 %-100 %] 100 % (04/04 0500) Weight:  [138 lb 0.1 oz (62.6 kg)] 138 lb 0.1 oz (62.6 kg) (04/04 0500) Last BM Date: 11/05/13  Filed Weights   11/04/13 0540 11/05/13 0500 11/06/13 0500  Weight: 151 lb 7.3 oz (68.7 kg) 146 lb 2.6 oz (66.3 kg) 138 lb 0.1 oz (62.6 kg)    Intake/Output Summary (Last 24 hours) at 11/06/13 1041 Last data filed at 11/06/13 0900  Gross per 24 hour  Intake   1060 ml  Output   1025 ml  Net     35 ml    Telemetry: NSR, short episodes of PSVT  Exam:  General: NAD  Resp: CTAB  Cardiac: RRR, no m/r/g, no JVD, no carotid bruits  VH:QIONGEXGI:abdomen soft, NT, ND  MSK: LEs are warm, no edema  Lab Results:  Basic Metabolic Panel:  Recent Labs Lab 11/04/13 0142 11/05/13 0400 11/06/13 0335  NA 139 140 142  K 3.3* 3.1* 4.2  CL 96 92* 93*  CO2 26 34* 33*  GLUCOSE 99 123* 115*  BUN 29* 25* 35*  CREATININE 1.57* 1.31* 1.65*  CALCIUM 8.2* 9.1 8.6  MG  --  1.9  --     Liver Function Tests:  Recent Labs Lab 11/01/13 1725  AST 24  ALT 14  ALKPHOS 45  BILITOT 0.9  PROT 6.5  ALBUMIN 4.1    CBC:  Recent Labs Lab 11/01/13 1725 11/03/13 1442  WBC 4.8 4.1  HGB 15.6* 15.4*  HCT 46.7* 44.7  MCV 93.6 94.5  PLT 206 177    Cardiac Enzymes:  Recent Labs Lab 11/03/13 2106 11/04/13 0142 11/04/13 0920  TROPONINI <0.30 <0.30 <0.30    BNP:  Recent Labs  11/03/13 1442  PROBNP 19644.0*    Coagulation: No results found for this basename: INR,  in the last 168 hours  ECG:   Medications:   Scheduled Medications: . aspirin EC  81 mg Oral Daily  . azithromycin  500 mg Oral Daily  . cholecalciferol   2,000 Units Oral Daily  . enoxaparin (LOVENOX) injection  30 mg Subcutaneous Q24H  . furosemide  20 mg Oral BID  . losartan  25 mg Oral Daily  . polyethylene glycol  17 g Oral Once  . sertraline  50 mg Oral Daily     Infusions:     PRN Medications:  acetaminophen, acetaminophen, bisacodyl, docusate sodium, ondansetron (ZOFRAN) IV, ondansetron, temazepam, zolpidem     Assessment/Plan    80 yo female history of CKD, HTN, HL, DM admitted with acute systolic heart failure.   1. Acute systolic heart failure - echo 5/2/844/2/15 LVEF 25-30%, diffuse hypokinesis, grade I diastolic dysfunction, moderate MR, moderate RV systolic dysfunction. PASP 64.  - negative 1.2 liters yesterdy, net negative 7.7 liters since admission. Cr and BUN trending up. She was on lasix 40mg  IV bid yesterday, changed to oral this morning. Agree with change. - cozaar added yesterday, given labile Cr will stop for now. She did receive a dose yesterday and today Can be reinitiated once renal function has stabilizied. Start low dose coreg today now  that she is more euvolemic - per prior discussions by Dr Jens Som, she does not want invasive testing.       Dina Rich, M.D., F.A.C.C.

## 2013-11-07 DIAGNOSIS — E1149 Type 2 diabetes mellitus with other diabetic neurological complication: Secondary | ICD-10-CM

## 2013-11-07 DIAGNOSIS — E1142 Type 2 diabetes mellitus with diabetic polyneuropathy: Secondary | ICD-10-CM

## 2013-11-07 LAB — BASIC METABOLIC PANEL
BUN: 39 mg/dL — AB (ref 6–23)
CHLORIDE: 92 meq/L — AB (ref 96–112)
CO2: 33 meq/L — AB (ref 19–32)
Calcium: 8.6 mg/dL (ref 8.4–10.5)
Creatinine, Ser: 1.74 mg/dL — ABNORMAL HIGH (ref 0.50–1.10)
GFR calc Af Amer: 29 mL/min — ABNORMAL LOW (ref >90)
GFR calc non Af Amer: 25 mL/min — ABNORMAL LOW (ref >90)
Glucose, Bld: 114 mg/dL — ABNORMAL HIGH (ref 70–99)
Potassium: 4.2 mEq/L (ref 3.7–5.3)
SODIUM: 139 meq/L (ref 137–147)

## 2013-11-07 LAB — GLUCOSE, CAPILLARY: GLUCOSE-CAPILLARY: 106 mg/dL — AB (ref 70–99)

## 2013-11-07 MED ORDER — CARVEDILOL 3.125 MG PO TABS: 3.1250 mg | ORAL_TABLET | Freq: Two times a day (BID) | ORAL | Status: DC

## 2013-11-07 MED ORDER — FUROSEMIDE 20 MG PO TABS: 20.0000 mg | ORAL_TABLET | Freq: Every day | ORAL | Status: DC

## 2013-11-07 MED ORDER — ASPIRIN 81 MG PO TBEC: 81.0000 mg | DELAYED_RELEASE_TABLET | Freq: Every day | ORAL | Status: DC

## 2013-11-07 MED ORDER — SODIUM CHLORIDE 0.9 % IV BOLUS (SEPSIS): 250.0000 mL | Freq: Once | INTRAVENOUS | Status: DC

## 2013-11-07 MED ORDER — FUROSEMIDE 20 MG PO TABS: 10.0000 mg | ORAL_TABLET | Freq: Every day | ORAL | Status: DC

## 2013-11-07 MED ORDER — FUROSEMIDE 40 MG PO TABS: 20.0000 mg | ORAL_TABLET | Freq: Every day | ORAL | Status: DC

## 2013-11-07 NOTE — Progress Notes (Addendum)
Patient ID: [name], female   DOB: 1922/02/25, 80 y.o.   MRN: [id number]      Subjective:    No complaints this morning.   Objective:   Temp:  [97.7 F (36.5 C)-98 F (36.7 C)] 97.7 F (36.5 C) (04/05 0530) Pulse Rate:  [62-71] 71 (04/05 0530) Resp:  [18-20] 18 (04/05 0530) BP: (95-117)/(39-51) 117/46 mmHg (04/05 0530) SpO2:  [95 %] 95 % (04/05 0530) Weight:  [139 lb 9.6 oz (63.322 kg)] 139 lb 9.6 oz (63.322 kg) (04/05 0530) Last BM Date: 11/06/13  Filed Weights   11/05/13 0500 11/06/13 0500 11/07/13 0530  Weight: 146 lb 2.6 oz (66.3 kg) 138 lb 0.1 oz (62.6 kg) 139 lb 9.6 oz (63.322 kg)    Intake/Output Summary (Last 24 hours) at 11/07/13 1010 Last data filed at 11/06/13 2100  Gross per 24 hour  Intake    580 ml  Output    501 ml  Net     79 ml    Exam:  General: NAD  Resp: CTAB  Cardiac: RRR, no m/r/g, no JVD  GI: abdomen soft, NT, ND  MSK: no LE edema  Neuro: no focal deficits   Lab Results:  Basic Metabolic Panel:  Recent Labs Lab 11/05/13 0400 11/06/13 0335 11/07/13 0535  NA 140 142 139  K 3.1* 4.2 4.2  CL 92* 93* 92*  CO2 34* 33* 33*  GLUCOSE 123* 115* 114*  BUN 25* 35* 39*  CREATININE 1.31* 1.65* 1.74*  CALCIUM 9.1 8.6 8.6  MG 1.9  --   --     Liver Function Tests:  Recent Labs Lab 11/01/13 1725  AST 24  ALT 14  ALKPHOS 45  BILITOT 0.9  PROT 6.5  ALBUMIN 4.1    CBC:  Recent Labs Lab 11/01/13 1725 11/03/13 1442  WBC 4.8 4.1  HGB 15.6* 15.4*  HCT 46.7* 44.7  MCV 93.6 94.5  PLT 206 177    Cardiac Enzymes:  Recent Labs Lab 11/03/13 2106 11/04/13 0142 11/04/13 0920  TROPONINI <0.30 <0.30 <0.30    BNP:  Recent Labs  11/03/13 1442  PROBNP 19644.0*    Coagulation: No results found for this basename: INR,  in the last 168 hours  ECG:   Medications:   Scheduled Medications: . aspirin EC  81 mg Oral Daily  . azithromycin  500 mg Oral Daily  . carvedilol  3.125 mg Oral BID WC  .  cholecalciferol  2,000 Units Oral Daily  . enoxaparin (LOVENOX) injection  30 mg Subcutaneous Q24H  . polyethylene glycol  17 g Oral Once  . sertraline  50 mg Oral Daily  . sodium chloride  250 mL Intravenous Once  . sodium chloride  250 mL Intravenous Once     Infusions:     PRN Medications:  acetaminophen, acetaminophen, bisacodyl, docusate sodium, ondansetron (ZOFRAN) IV, ondansetron, temazepam, zolpidem     Assessment/Plan    80 yo female history of CKD, HTN, HL, DM admitted with acute systolic heart failure.   1. Acute systolic heart failure  - echo 11/04/13 LVEF 25-30%, diffuse hypokinesis, grade I diastolic dysfunction, moderate MR, moderate RV systolic dysfunction. PASP 64.  - + yesterday, net negative 7.6 liters since admission. Cr and BUN trending up, though within her previous baseline. She is on once a day oral lasix - cozaar added previously, given labile it has been stopped.  Can be reinitiated once renal function has stabilizied.  - started low dose coreg yesterday, soft  bp's this AM, will not further titrate. Soft bp maybe related to hypovolemia.  - per prior discussions by Dr Jens Somrenshaw, she does not want invasive testing.  - she will need close follow up with Dr Jens Somrenshaw at discharge with repeat lab work     Ashley RichJonathan Roverto [name], M.D., F.A.C.C.

## 2013-11-07 NOTE — Discharge Summary (Signed)
Physician Discharge Summary  Otway Pazos QIO:[id number] DOB: 1921-10-18 DOA: 11/03/2013  PCP: Alesia Richards, MD  Admit date: 11/03/2013 Discharge date: 11/07/2013  Time spent: 35 minutes  Recommendations for Outpatient Follow-up:  1. Follow up with cardiology in 1 week. b-met at this time. 2. Titrate HF emds as tolerate it.  BNP    Component Value Date/Time   PROBNP 19644.0* 11/03/2013 1442   Filed Weights   11/05/13 0500 11/06/13 0500 11/07/13 0530  Weight: 66.3 kg (146 lb 2.6 oz) 62.6 kg (138 lb 0.1 oz) 63.322 kg (139 lb 9.6 oz)     Discharge Diagnoses:  Principal Problem:   Acute combined systolic and diastolic CHF, NYHA class 3 Active Problems:   Hypertension   Type II or unspecified type diabetes mellitus without mention of complication, not stated as uncontrolled   GERD (gastroesophageal reflux disease)   Acute on chronic renal failure   CAP (community acquired pneumonia)   Discharge Condition:Stable  Diet recommendation: low sodium fluid restricted diet    History of present illness:  80 y.o. female with h/o HTN, CKD-last cr 1.13 in 08/2013>>1.36 on 3/30, GERD with chronic cough, DM on no meds who presents with the above complaints. She states that over the past month she has had worsening of leg swelling and DOE.she went to see her PCP 2days ago and labs revealed elevated BNP of about 2821. She was started on lasix 40bid, but her symptoms were continuing to worsen and so she was referrred to the ED for admission. She admits to having to sleep in recliner due to SOB. She reports chronic non productive cough attributed to GERD and states that it has not changed. She denies fevers. Pt also denies CP. She was seen in ED and BNP 19,644, CXR read as LLL PNA, wbc 4.1, cr 1.62 and she was given IV lasix and started on empiric abx. She is admitted for further eval and management.   Hospital Course:  Systolic and diastolic heart failure, acute: - started on IV  Lasix, then change to oral once  symproms improved. - Cr. Improving. Repleted electrolytes.  - Fluid restrict, strict I and O's.  - Echo 4.2.2015 as below.  - Re-try cozaar as an out patient   CAP:  - Rocephin and azithromycin started on 4.2.2015. De escalate to [employer].  - completed course.  Acute on chronic renal failure(solitary kidney):  - Improving with diuresis.  - back to baslein (1.6)  GERD (gastroesophageal reflux disease)  - Protonix   Hypertension  - stable.   Type II or unspecified type diabetes mellitus without mention of complication, not stated as uncontrolled  - HbgA1c 6.6  - had episode of low BG.    Procedures: echo 4.2.2015: estimated ejection fraction was in the range of 25% to 30%. Diffuse hypokinesis. Doppler parameters are consistent with abnormal left ventricular relaxation (grade 1 diastolic dysfunction)       Consultations:  cardiology  Discharge Exam: Filed Vitals:   11/07/13 0530  BP: 117/46  Pulse: 71  Temp: 97.7 F (36.5 C)  Resp: 18    General: A&O x3 Cardiovascular: RRR Respiratory: good air movement CTA B/L  Discharge Instructions      Discharge Orders   Future Appointments Provider Department Dept Phone   11/09/2013 1:45 PM [place] [place] [facility]   11/23/2013 11:30 AM Christean Leaf [facility] [phone]   02/28/2014 11:00 AM Unk Pinto, MD [facility] [phone]   Future Orders Complete By Expires   Diet - low sodium heart healthy  As directed    Increase activity slowly  As directed        Medication List    STOP taking these medications       triamterene-hydrochlorothiazide 37.5-25 MG per capsule  Commonly known as:  DYAZIDE      TAKE these medications       aspirin 81 MG EC tablet  Take 1 tablet (81 mg total) by mouth daily.     carvedilol 3.125 MG tablet  Commonly known as:   COREG  Take 1 tablet (3.125 mg total) by mouth 2 (two) times daily with a meal.     FISH OIL PO  Take 1 capsule by mouth daily.     furosemide 40 MG tablet  Commonly known as:  LASIX  Take 0.5 tablets (20 mg total) by mouth daily.     MAGNESIUM PO  Take 1 tablet by mouth daily.     OVER THE COUNTER MEDICATION  Take 1 tablet by mouth daily. Tumeric     OVER THE COUNTER MEDICATION  Take 1 tablet by mouth daily. Herbavision-Lutin-Bilberry     PROBIOTIC DAILY PO  Take 1 tablet by mouth daily.     sertraline 100 MG tablet  Commonly known as:  ZOLOFT  Take 50 mg by mouth daily.     TYLENOL 325 MG tablet  Generic drug:  acetaminophen  Take 650 mg by mouth every 6 (six) hours as needed for mild pain.     Vitamin D 2000 UNITS Caps  Take 2,000 Units by mouth daily.     VITAMIN K PO  Take 1 tablet by mouth daily.     VITAMIN-B COMPLEX PO  Take 1 tablet by mouth daily.       Allergies  Allergen Reactions  . Ace Inhibitors   . Codeine     dysuria  . Lisinopril Swelling  . Morphine And Related     dysuria  . Percocet [Oxycodone-Acetaminophen]     dysuria  . Vitamin C     dysuria  . Vitamin D Analogs     Large doses cause dysuria   Follow-up Information   Follow up with Minus Breeding, MD In 1 week. (hospital follow up)    Specialty:  Cardiology   Contact information:   [address] [place] [address] [phone]        The results of significant diagnostics from this hospitalization (including imaging, microbiology, ancillary and laboratory) are listed below for reference.    Significant Diagnostic Studies: Dg Chest 2 View  11/03/2013   CLINICAL DATA:  Dry hacking cough. Bilateral leg and feet swelling. Short of breath.  EXAM: CHEST  2 VIEW  COMPARISON:  07/05/2011  FINDINGS: Mild to moderate enlargement of the cardiac silhouette. Aorta is mildly uncoiled. No mediastinal or hilar masses or adenopathy.  There is opacity at the left lung base  silhouetting the left hemidiaphragm. This is consistent with pneumonia. Lungs are hyperexpanded but otherwise clear. No pleural effusions. No pneumothorax.  Bony thorax is diffusely demineralized but grossly intact.  IMPRESSION: Left lower lobe pneumonia.   Electronically Signed   By: Lajean Manes M.D.   On: 11/03/2013 15:18   Portable Chest 1 View  11/04/2013   CLINICAL DATA:  Pneumonia.  EXAM: PORTABLE CHEST - 1 VIEW  COMPARISON:  DG CHEST 2 VIEW dated 11/03/2013  FINDINGS: Mediastinum  and hilar structures are normal. Left lower lobe infiltrate noted consistent with pneumonia. This has progressed. Small left pleural effusion noted. No pneumothorax. No acute bony abnormality .  IMPRESSION: 1. Left lower lobe infiltrate consistent with pneumonia. Left lower lobe infiltrate has progressed from prior exam. Associated small left pleural effusion is most likely present. 2. Severe cardiomegaly.  Pulmonary vascularity is normal.   Electronically Signed   By: Marcello Moores  Register   On: 11/04/2013 07:42    Microbiology: Recent Results (from the past 240 hour(s))  URINE CULTURE     Status: None   Collection Time    11/01/13  5:25 PM      Result Value Ref Range Status   Colony Count >=100,000 COLONIES/ML   Final   Organism ID, Bacteria Multiple bacterial morphotypes present, none   Final   Organism ID, Bacteria predominant. Suggest appropriate recollection if    Final   Organism ID, Bacteria clinically indicated.   Final     Labs: Basic Metabolic Panel:  Recent Labs Lab 11/03/13 1442 11/04/13 0142 11/05/13 0400 11/06/13 0335 11/07/13 0535  NA 141 139 140 142 139  K 3.8 3.3* 3.1* 4.2 4.2  CL 95* 96 92* 93* 92*  CO2 30 26 34* 33* 33*  GLUCOSE 104* 99 123* 115* 114*  BUN 24* 29* 25* 35* 39*  CREATININE 1.62* 1.57* 1.31* 1.65* 1.74*  CALCIUM 8.6 8.2* 9.1 8.6 8.6  MG  --   --  1.9  --   --    Liver Function Tests:  Recent Labs Lab 11/01/13 1725  AST 24  ALT 14  ALKPHOS 45  BILITOT 0.9  PROT  6.5  ALBUMIN 4.1   No results found for this basename: LIPASE, AMYLASE,  in the last 168 hours No results found for this basename: AMMONIA,  in the last 168 hours CBC:  Recent Labs Lab 11/01/13 1725 11/03/13 1442  WBC 4.8 4.1  NEUTROABS 3.2  --   HGB 15.6* 15.4*  HCT 46.7* 44.7  MCV 93.6 94.5  PLT 206 177   Cardiac Enzymes:  Recent Labs Lab 11/03/13 2106 11/04/13 0142 11/04/13 0920  TROPONINI <0.30 <0.30 <0.30   BNP: BNP (last 3 results)  Recent Labs  11/03/13 1442  PROBNP 19644.0*   CBG:  Recent Labs Lab 11/06/13 0609 11/06/13 1132 11/06/13 1627 11/06/13 2107 11/07/13 0603  GLUCAP 106* 199* 126* 135* 106*       Signed:  Charlynne Cousins  [facility] 11/07/2013, 9:35 AM

## 2013-11-07 NOTE — Progress Notes (Signed)
Bilateral hearing aids in place upon discharge.  Dentures in mouth.  Daughter has small black mobile phone.  Daughter verbalized understanding of discharge instructions.

## 2013-11-09 ENCOUNTER — Ambulatory Visit (INDEPENDENT_AMBULATORY_CARE_PROVIDER_SITE_OTHER): Payer: Medicare Other | Admitting: Podiatry

## 2013-11-09 ENCOUNTER — Encounter: Payer: Self-pay | Admitting: Podiatry

## 2013-11-09 ENCOUNTER — Telehealth: Payer: Self-pay

## 2013-11-09 VITALS — BP 115/63 | HR 73 | Resp 16

## 2013-11-09 DIAGNOSIS — M79609 Pain in unspecified limb: Secondary | ICD-10-CM

## 2013-11-09 DIAGNOSIS — B351 Tinea unguium: Secondary | ICD-10-CM

## 2013-11-09 LAB — GLUCOSE, CAPILLARY: Glucose-Capillary: <10 mg/dL — CL (ref 70–99)

## 2013-11-09 NOTE — Telephone Encounter (Signed)
Pt's son called with questions about new Rx Cephalexin 500 mg. 1 tablet QID. Pt's son isn't sure what Rx is for and is requesting to speak with you. Pt recently in hospital and is now seeing a cardiologist. Please advise.

## 2013-11-09 NOTE — Progress Notes (Signed)
She presents today with a chief complaint of painful elongated toenails and swollen feet stating that she's recently been in the hospital for congestive heart failure.  Objective: Vital signs are stable she is alert and oriented x3 mild edema bilateral foot putting in nature. Pulses are palpable bilateral. Nails are thick yellow dystrophic lytic mycotic and painful palpation as well as debridement.  Assessment: Pain in limb secondary to onychomycosis bilateral.  Plan: Debridement of nails in thickness and length as cover service.

## 2013-11-15 ENCOUNTER — Ambulatory Visit (INDEPENDENT_AMBULATORY_CARE_PROVIDER_SITE_OTHER): Payer: Medicare Other | Admitting: Physician Assistant

## 2013-11-15 VITALS — BP 122/62 | HR 60 | Temp 98.2°F | Resp 16 | Ht 63.0 in | Wt 139.0 lb

## 2013-11-15 DIAGNOSIS — I10 Essential (primary) hypertension: Secondary | ICD-10-CM

## 2013-11-15 DIAGNOSIS — N179 Acute kidney failure, unspecified: Secondary | ICD-10-CM

## 2013-11-15 DIAGNOSIS — I509 Heart failure, unspecified: Secondary | ICD-10-CM

## 2013-11-15 DIAGNOSIS — Z79899 Other long term (current) drug therapy: Secondary | ICD-10-CM

## 2013-11-15 LAB — CBC WITH DIFFERENTIAL/PLATELET
Basophils Absolute: 0 10*3/uL (ref 0.0–0.1)
Basophils Relative: 1 % (ref 0–1)
Eosinophils Absolute: 0 10*3/uL (ref 0.0–0.7)
Eosinophils Relative: 1 % (ref 0–5)
HCT: 47.1 % — ABNORMAL HIGH (ref 36.0–46.0)
HEMOGLOBIN: 15.7 g/dL — AB (ref 12.0–15.0)
LYMPHS ABS: 0.9 10*3/uL (ref 0.7–4.0)
Lymphocytes Relative: 21 % (ref 12–46)
MCH: 31.5 pg (ref 26.0–34.0)
MCHC: 33.3 g/dL (ref 30.0–36.0)
MCV: 94.6 fL (ref 78.0–100.0)
MONOS PCT: 5 % (ref 3–12)
Monocytes Absolute: 0.2 10*3/uL (ref 0.1–1.0)
NEUTROS ABS: 3.2 10*3/uL (ref 1.7–7.7)
NEUTROS PCT: 72 % (ref 43–77)
Platelets: 248 10*3/uL (ref 150–400)
RBC: 4.98 MIL/uL (ref 3.87–5.11)
RDW: 15.3 % (ref 11.5–15.5)
WBC: 4.5 10*3/uL (ref 4.0–10.5)

## 2013-11-15 LAB — BASIC METABOLIC PANEL WITH GFR
BUN: 34 mg/dL — ABNORMAL HIGH (ref 6–23)
CO2: 34 meq/L — AB (ref 19–32)
Calcium: 9.6 mg/dL (ref 8.4–10.5)
Chloride: 96 mEq/L (ref 96–112)
Creat: 1.36 mg/dL — ABNORMAL HIGH (ref 0.50–1.10)
GFR, EST AFRICAN AMERICAN: 40 mL/min — AB
GFR, Est Non African American: 35 mL/min — ABNORMAL LOW
GLUCOSE: 108 mg/dL — AB (ref 70–99)
POTASSIUM: 4.3 meq/L (ref 3.5–5.3)
SODIUM: 139 meq/L (ref 135–145)

## 2013-11-15 LAB — MAGNESIUM: MAGNESIUM: 2.3 mg/dL (ref 1.5–2.5)

## 2013-11-15 LAB — HEPATIC FUNCTION PANEL
ALT: 17 U/L (ref 0–35)
AST: 31 U/L (ref 0–37)
Albumin: 3.9 g/dL (ref 3.5–5.2)
Alkaline Phosphatase: 43 U/L (ref 39–117)
BILIRUBIN DIRECT: 0.2 mg/dL (ref 0.0–0.3)
Indirect Bilirubin: 0.8 mg/dL (ref 0.2–1.2)
Total Bilirubin: 1 mg/dL (ref 0.2–1.2)
Total Protein: 6.4 g/dL (ref 6.0–8.3)

## 2013-11-15 MED ORDER — HYDROCODONE-ACETAMINOPHEN 5-325 MG PO TABS: ORAL_TABLET | ORAL | Status: DC

## 2013-11-15 NOTE — Patient Instructions (Signed)
Heart Failure Heart failure means your heart has trouble pumping blood. This makes it hard for your body to work well. Heart failure is usually a long-term (chronic) condition. You must take good care of yourself and follow your doctor's treatment plan. HOME CARE  Take your heart medicine as told by your doctor.  Do not stop taking medicine unless your doctor tells you to.  Do not skip any dose of medicine.  Refill your medicines before they run out.  Take other medicines only as told by your doctor or pharmacist.  Stay active if told by your doctor. The elderly and people with severe heart failure should talk with a doctor about physical activity.  Eat heart healthy foods. Choose foods that are without trans fat and are low in saturated fat, cholesterol, and salt (sodium). This includes fresh or frozen fruits and vegetables, fish, lean meats, fat-free or low-fat dairy foods, whole grains, and high-fiber foods. Lentils and dried peas and beans (legumes) are also good choices.  Limit salt if told by your doctor.  Cook in a healthy way. Roast, grill, broil, bake, poach, steam, or stir-fry foods.  Limit fluids as told by your doctor.  Weigh yourself every morning. Do this after you pee (urinate) and before you eat breakfast. Write down your weight to give to your doctor.  Take your blood pressure and write it down if your doctor tell you to.  Ask your doctor how to check your pulse. Check your pulse as told.  Lose weight if told by your doctor.  Stop smoking or chewing tobacco. Do not use gum or patches that help you quit without your doctor's approval.  Schedule and go to doctor visits as told.  Nonpregnant women should have no more than 1 drink a day. Men should have no more than 2 drinks a day. Talk to your doctor about drinking alcohol.  Stop illegal drug use.  Stay current with shots (immunizations).  Manage your health conditions as told by your doctor.  Learn to manage  your stress.  Rest when you are tired.  If it is really hot outside:  Avoid intense activities.  Use air conditioning or fans, or get in a cooler place.  Avoid caffeine and alcohol.  Wear loose-fitting, lightweight, and light-colored clothing.  If it is really cold outside:  Avoid intense activities.  Layer your clothing.  Wear mittens or gloves, a hat, and a scarf when going outside.  Avoid alcohol.  Learn about heart failure and get support as needed.  Get help to maintain or improve your quality of life and your ability to care for yourself as needed. GET HELP IF:   You gain 03 lb/1.4 kg or more in 1 day or 05 lb/2.3 kg in a week.  You are more short of breath than usual.  You cannot do your normal activities.  You tire easily.  You cough more than normal, especially with activity.  You have any or more puffiness (swelling) in areas such as your hands, feet, ankles, or belly (abdomen).  You cannot sleep because it is hard to breathe.  You feel like your heart is beating fast (palpitations).  You get dizzy or lightheaded when you stand up. GET HELP RIGHT AWAY IF:   You have trouble breathing.  There is a change in mental status, such as becoming less alert or not being able to focus.  You have chest pain or discomfort.  You faint. MAKE SURE YOU:   Understand these   instructions.  Will watch your condition.  Will get help right away if you are not doing well or get worse. Document Released: 04/30/2008 Document Revised: 11/16/2012 Document Reviewed: 02/20/2012 [employer] Patient Information 2014 [employer].  

## 2013-11-15 NOTE — Progress Notes (Signed)
HPI [age] y.o.female presents for hospital follow up. She failed out patient treatment for new onset CHF, when admitted she was found to have DCMP EF 30-35% complicated by pneumonia. She was carefully dieresed and lost 30 lbs in the hospital. She states she is feeling much better, she is able to sleep at night again in her bed. She continues to have dry cough, weight is the same as when she left the hospital. Family is very supportive and doing a great job of maintaining weight. She states she has aching in her shoulder, legs, arm, she is unable to take NSAIDS due to previous nephrectomy. She continue to have swelling in her legs however it is significantly better than the last time I saw her. Denies PND, orthopnea, SOB.   Past Medical History  Diagnosis Date  . Hyperlipidemia   . Hypertension   . Type II or unspecified type diabetes mellitus without mention of complication, not stated as uncontrolled   . Diabetic neuropathy   . Chronic kidney disease   . GERD (gastroesophageal reflux disease)   . Osteoporosis      Allergies  Allergen Reactions  . Ace Inhibitors   . Codeine     dysuria  . Lisinopril Swelling  . Morphine And Related     dysuria  . Percocet [Oxycodone-Acetaminophen]     dysuria  . Vitamin C     dysuria  . Vitamin D Analogs     Large doses cause dysuria      Current Outpatient Prescriptions on File Prior to Visit  Medication Sig Dispense Refill  . acetaminophen (TYLENOL) 325 MG tablet Take 650 mg by mouth every 6 (six) hours as needed for mild pain.       Marland Kitchen. aspirin EC 81 MG EC tablet Take 1 tablet (81 mg total) by mouth daily.      . B Complex Vitamins (VITAMIN-B COMPLEX PO) Take 1 tablet by mouth daily.       . carvedilol (COREG) 3.125 MG tablet Take 1 tablet (3.125 mg total) by mouth 2 (two) times daily with a meal.  60 tablet  0  . Cholecalciferol (VITAMIN D) 2000 UNITS CAPS Take 2,000 Units by mouth daily.      . furosemide (LASIX) 40 MG tablet Take 0.5 tablets  (20 mg total) by mouth daily.  30 tablet  0  . MAGNESIUM PO Take 1 tablet by mouth daily.       . Omega-3 Fatty Acids (FISH OIL PO) Take 1 capsule by mouth daily.       Marland Kitchen. OVER THE COUNTER MEDICATION Take 1 tablet by mouth daily. Tumeric      . OVER THE COUNTER MEDICATION Take 1 tablet by mouth daily. Herbavision-Lutin-Bilberry      . Probiotic Product (PROBIOTIC DAILY PO) Take 1 tablet by mouth daily.       . sertraline (ZOLOFT) 100 MG tablet Take 50 mg by mouth daily.       Marland Kitchen. VITAMIN K PO Take 1 tablet by mouth daily.        No current facility-administered medications on file prior to visit.    ROS: all negative expect above.   Physical: Wt Readings from Last 3 Encounters:  11/15/13 139 lb (63.05 kg)  11/07/13 139 lb 9.6 oz (63.322 kg)  11/01/13 166 lb (75.297 kg)    BP 122/62  Pulse 60  Temp(Src) 98.2 F (36.8 C)  Resp 16  Ht 5\' 3"  (1.6 m)  Wt 139 lb (63.05  kg)  BMI 24.63 kg/m2 General Appearance: Well nourished, in no apparent distress. Eyes: PERRLA, EOMs. Sinuses: No Frontal/maxillary tenderness ENT/Mouth: Ext aud canals clear, normal light reflex with TMs without erythema, bulging. Post pharynx without erythema, swelling, exudate.  Respiratory: decreased breast sounds but CTAB Cardio: RRR, no murmurs, rubs or gallops. 1-2 + edema bilateral legs Abdomen: Soft, with bowl sounds. Nontender, no guarding, rebound. Lymphatics: Non tender without lymphadenopathy.  Musculoskeletal: Full ROM all peripheral extremities, walks with walker Skin: Warm, dry without rashes, lesions, ecchymosis.  Neuro: Cranial nerves intact, reflexes equal bilaterally. Normal muscle tone, no cerebellar symptoms.   Psych: Awake and oriented X 3, normal affect, Insight and Judgment appropriate.   Assessment and Plan:  CHF- If any increasing shortness of breath, swelling, or chest pressure go to ER immediately. Very supportive family, discussed CHF and disease process. Decrease your sodium intake to  less than 2000 mg daily, decrease your fluid intake to less than 2 L daily, elevate legs, weight yourself daily and please remember to always increase your potassium intake with any increase of your fluid pill.Follow up with cardio.  ARF with history of nephrectomy and recent controlled diurese in the hospital- check Mg, BMP,  Pain- ? Chronic pain versus low K+/Mag contributing- check electrolytes Norco 5/325 # 60 NR- try 1/2- 1 BID.

## 2013-11-16 ENCOUNTER — Telehealth: Payer: Self-pay

## 2013-11-16 LAB — TSH: TSH: 7.702 u[IU]/mL — ABNORMAL HIGH (ref 0.350–4.500)

## 2013-11-16 MED ORDER — LEVOTHYROXINE SODIUM 50 MCG PO TABS: ORAL_TABLET | ORAL | Status: DC

## 2013-11-16 NOTE — Addendum Note (Signed)
Addended by: Quentin MullingOLLIER, Milania Haubner R on: 11/16/2013 08:39 AM   Modules accepted: Orders

## 2013-11-16 NOTE — Telephone Encounter (Signed)
Spoke with patients son in [name] , gave lab results and cancelled 4-21 ov and rescheduled a one month ov per Quentin MullingAmanda Collier, PA

## 2013-11-18 ENCOUNTER — Encounter: Payer: Self-pay | Admitting: Physician Assistant

## 2013-11-22 ENCOUNTER — Ambulatory Visit (INDEPENDENT_AMBULATORY_CARE_PROVIDER_SITE_OTHER): Payer: Medicare Other | Admitting: Physician Assistant

## 2013-11-22 ENCOUNTER — Encounter: Payer: Self-pay | Admitting: Physician Assistant

## 2013-11-22 VITALS — BP 120/72 | HR 68 | Ht 63.0 in | Wt 140.0 lb

## 2013-11-22 DIAGNOSIS — I10 Essential (primary) hypertension: Secondary | ICD-10-CM

## 2013-11-22 DIAGNOSIS — I509 Heart failure, unspecified: Secondary | ICD-10-CM

## 2013-11-22 DIAGNOSIS — N189 Chronic kidney disease, unspecified: Secondary | ICD-10-CM

## 2013-11-22 DIAGNOSIS — I5041 Acute combined systolic (congestive) and diastolic (congestive) heart failure: Secondary | ICD-10-CM

## 2013-11-22 MED ORDER — LOSARTAN POTASSIUM 25 MG PO TABS: 25.0000 mg | ORAL_TABLET | Freq: Every day | ORAL | Status: DC

## 2013-11-22 NOTE — Patient Instructions (Addendum)
Your physician recommends that you schedule a follow-up appointment in: WITH MICHELE LENZE IN 2 WEEKS  Your physician recommends that you schedule a follow-up appointment in: WITH DR. CRENSHAW IN 2 MONTHS  Your physician recommends that you return for lab work in: BMET IN ONE WEEK  Your physician has recommended you make the following change in your medication:   START COZAAR 25 MG ONCE A DAY   YOUR PROVIDED RECOMMENDED YOU TO START  WEARING COMPRESSION STOCKINGS

## 2013-11-22 NOTE — Assessment & Plan Note (Addendum)
Patient's heart failure is compensated today. They have kept at weight chart at home and she has been 140 pounds regularly. I will try adding Cozaar 25 mg once daily. We'll check a Bment next week. If her creatinine is elevated I will stop the Cozaar. She only has one kidney. I will see her back in 2 weeks and hopefully be a little titrate her carvedilol up. Recommend TED stockings.She will see Dr. Jens Somrenshaw back in 2 months. Cardiac rehabilitation if patient is able to attend.

## 2013-11-22 NOTE — Progress Notes (Signed)
HPI: [name] who was admitted to the hospital with congestive heart failure and pneumonia. Echocardiogram shows severe systolic dysfunction EF 25-30% with moderate mitral regurgitation and severely elevated pulmonary pressures. She was treated with IV Lasix and renal function was followed closely given history of nephrectomy and baseline renal insufficiency. Cozaar was started but then stopped because of increased creatinine. She did not want aggressive procedures including cardiac catheterization. Medical therapy was recommended. Followup renal function on 11/15/13 BUN was 34 creatinine 1.36 which was better. Creatinine was 1.74 at discharge on 11/07/13.  Patient comes in today complaining by her son-in-law. She complains of being very weak and gets out of breath with little exertion. She is reluctantly watching her salt. She denies any dyspnea at rest, orthopnea, chest pain, dizziness, or presyncope. She did have one occasion where she felt her heart flutter that it was fleeting. Prior to hospitalization she was working out [employer]. twice a week but she is too weak to do this. I discussed cardiac rehabilitation with the patient but they think it would be difficult to get her there 3 days a week.  Allergies -- Ace Inhibitors   -- Codeine    --  dysuria  -- Lisinopril -- Swelling  -- Morphine And Related    --  dysuria  -- Percocet [Oxycodone-Acetaminophen]    --  dysuria  -- Vitamin C    --  dysuria  -- Vitamin D Analogs    --  Large doses cause dysuria  Current Outpatient Prescriptions on File Prior to Visit: acetaminophen (TYLENOL) 325 MG tablet, Take 650 mg by mouth every 6 (six) hours as needed for mild pain. , Disp: , Rfl:  aspirin EC 81 MG EC tablet, Take 1 tablet (81 mg total) by mouth daily., Disp: , Rfl:  B Complex Vitamins (VITAMIN-B COMPLEX PO), Take 1 tablet by mouth daily. , Disp: , Rfl:  carvedilol (COREG) 3.125 MG tablet, Take 1 tablet (3.125 mg total) by  mouth 2 (two) times daily with a meal., Disp: 60 tablet, Rfl: 0 Cholecalciferol (VITAMIN D) 2000 UNITS CAPS, Take 2,000 Units by mouth daily., Disp: , Rfl:  furosemide (LASIX) 40 MG tablet, Take 0.5 tablets (20 mg total) by mouth daily., Disp: 30 tablet, Rfl: 0 HYDROcodone-acetaminophen (NORCO) 5-325 MG per tablet, 1/2-1 pill twice daily as needed for pain, Disp: 60 tablet, Rfl: 0 levothyroxine (SYNTHROID, LEVOTHROID) 50 MCG tablet, 1/2 pill daily, Disp: 30 tablet, Rfl: 6 MAGNESIUM PO, Take 1 tablet by mouth daily. , Disp: , Rfl:  Omega-3 Fatty Acids (FISH OIL PO), Take 1 capsule by mouth daily. , Disp: , Rfl:  OVER THE COUNTER MEDICATION, Take 1 tablet by mouth daily. Tumeric, Disp: , Rfl:  OVER THE COUNTER MEDICATION, Take 1 tablet by mouth daily. Herbavision-Lutin-Bilberry, Disp: , Rfl:  Probiotic Product (PROBIOTIC DAILY PO), Take 1 tablet by mouth daily. , Disp: , Rfl:  sertraline (ZOLOFT) 100 MG tablet, Take 50 mg by mouth daily. , Disp: , Rfl:  VITAMIN K PO, Take 1 tablet by mouth daily. , Disp: , Rfl:   No current facility-administered medications on file prior to visit.   Past Medical History:   Hyperlipidemia                                               Hypertension  Type II or unspecified type diabetes mellitus *              Diabetic neuropathy                                          Chronic kidney disease                                       GERD (gastroesophageal reflux disease)                       Osteoporosis                                                Past Surgical History:   NEPHRECTOMY                                     Right 1959         BREAST LUMPECTOMY                                               Comment:1987,1992,2005   BLADDER SURGERY                                  1984         COLOSTOMY                                        2004           Comment:Reversed 2005   EYE SURGERY                                       2008 and *  Review of patient's family history indicates:   Heart disease                  Father                   Heart disease                  Sister                   Diabetes                       Daughter                 Hypertension                   Son                      Social History   Marital Status: Widowed  Spouse Name:                      Years of Education:                 Number of children: 2           Occupational History   None on file  Social History Main Topics   Smoking Status: Never Smoker                     Smokeless Status: Not on file                      Alcohol Use: Yes               Comment: Occ wine states she is going to stop   Drug Use: No             Sexual Activity: No                 Other Topics            Concern   None on file  Social History Narrative   None on file    ROS: See history of present illness otherwise   PHYSICAL EXAM: Elderly, in no acute distress. Neck: No JVD, HJR, Bruit, or thyroid enlargement  Lungs: No tachypnea, clear without wheezing, rales, or rhonchi  Cardiovascular: RRR, PMI not displaced, heart sounds normal, no murmurs, gallops, bruit, thrill, or heave.  Abdomen: BS normal. Soft without organomegaly, masses, lesions or tenderness.  Extremities: Bilateral ankle edema left greater than right chronic, otherwise lower extremities without cyanosis, clubbing. Good distal pulses bilateral  SKin: Warm, no lesions or rashes   Musculoskeletal: No deformities  Neuro: no focal signs  BP 120/72  Pulse 68  Ht 5\' 3"  (1.6 m)  Wt 140 lb (63.504 kg)  BMI 24.81 kg/m2      2-D echo 11/04/13 Study Conclusions  - Left ventricle: The cavity size was mildly dilated. Wall   thickness was normal. Systolic function was severely   reduced. The estimated ejection fraction was in the range   of 25% to 30%. Diffuse hypokinesis. Doppler parameters are   consistent with abnormal left  ventricular relaxation   (grade 1 diastolic dysfunction). Doppler parameters are   consistent with high ventricular filling pressure. - Mitral valve: Calcified annulus. Mildly thickened leaflets   . Moderate regurgitation. - Left atrium: The atrium was severely dilated. - Right ventricle: The cavity size was mildly dilated.   Systolic function was moderately reduced. - Right atrium: The atrium was mildly dilated. - Pulmonary arteries: Systolic pressure was moderately to   severely increased. PA peak pressure: 64mm Hg (S). - Pericardium, extracardiac: A small pericardial effusion   was identified. There was a left pleural effusion.

## 2013-11-22 NOTE — Assessment & Plan Note (Signed)
We'll watch kidney function closely on Cozaar.

## 2013-11-22 NOTE — Assessment & Plan Note (Signed)
Controlled.  

## 2013-11-23 ENCOUNTER — Ambulatory Visit: Payer: Self-pay | Admitting: Emergency Medicine

## 2013-11-29 ENCOUNTER — Other Ambulatory Visit: Payer: Self-pay | Admitting: Physician Assistant

## 2013-11-29 ENCOUNTER — Other Ambulatory Visit (INDEPENDENT_AMBULATORY_CARE_PROVIDER_SITE_OTHER): Payer: Medicare Other

## 2013-11-29 DIAGNOSIS — I5041 Acute combined systolic (congestive) and diastolic (congestive) heart failure: Secondary | ICD-10-CM

## 2013-11-29 DIAGNOSIS — I509 Heart failure, unspecified: Secondary | ICD-10-CM

## 2013-11-29 DIAGNOSIS — I10 Essential (primary) hypertension: Secondary | ICD-10-CM

## 2013-11-29 DIAGNOSIS — N189 Chronic kidney disease, unspecified: Secondary | ICD-10-CM

## 2013-11-29 LAB — BASIC METABOLIC PANEL
BUN: 32 mg/dL — ABNORMAL HIGH (ref 6–23)
CHLORIDE: 97 meq/L (ref 96–112)
CO2: 31 meq/L (ref 19–32)
CREATININE: 1.3 mg/dL — AB (ref 0.4–1.2)
Calcium: 9 mg/dL (ref 8.4–10.5)
GFR: 51.3 mL/min — ABNORMAL LOW (ref >60.00)
Glucose, Bld: 107 mg/dL — ABNORMAL HIGH (ref 70–99)
Potassium: 4.5 mEq/L (ref 3.5–5.1)
Sodium: 137 mEq/L (ref 135–145)

## 2013-11-30 ENCOUNTER — Ambulatory Visit: Payer: Self-pay | Admitting: Emergency Medicine

## 2013-12-03 ENCOUNTER — Encounter: Payer: Self-pay | Admitting: Physician Assistant

## 2013-12-03 ENCOUNTER — Telehealth: Payer: Self-pay | Admitting: Physician Assistant

## 2013-12-03 NOTE — Telephone Encounter (Signed)
New Message:  Pt's family is requesting to her the pt's recent lab results

## 2013-12-06 ENCOUNTER — Ambulatory Visit (INDEPENDENT_AMBULATORY_CARE_PROVIDER_SITE_OTHER): Payer: Medicare Other | Admitting: Physician Assistant

## 2013-12-06 ENCOUNTER — Encounter: Payer: Self-pay | Admitting: Physician Assistant

## 2013-12-06 VITALS — BP 127/68 | HR 60 | Ht 63.0 in | Wt 140.4 lb

## 2013-12-06 DIAGNOSIS — I509 Heart failure, unspecified: Secondary | ICD-10-CM

## 2013-12-06 DIAGNOSIS — I5041 Acute combined systolic (congestive) and diastolic (congestive) heart failure: Secondary | ICD-10-CM

## 2013-12-06 DIAGNOSIS — N189 Chronic kidney disease, unspecified: Secondary | ICD-10-CM

## 2013-12-06 DIAGNOSIS — I10 Essential (primary) hypertension: Secondary | ICD-10-CM

## 2013-12-06 MED ORDER — CARVEDILOL 6.25 MG PO TABS: 6.2500 mg | ORAL_TABLET | Freq: Two times a day (BID) | ORAL | Status: DC

## 2013-12-06 NOTE — Assessment & Plan Note (Signed)
Stable

## 2013-12-06 NOTE — Assessment & Plan Note (Signed)
Patient only has one kidney, but her last creatinine was stable at 1.3.

## 2013-12-06 NOTE — Progress Notes (Signed)
HPI:  [name] who was admitted to the hospital with congestive heart failure and pneumonia. Echocardiogram shows severe systolic dysfunction EF 25-30% with moderate mitral regurgitation and severely elevated pulmonary pressures. She was treated with IV Lasix and renal function was followed closely given history of nephrectomy and baseline renal insufficiency. Cozaar was started but then stopped because of increased creatinine. She did not want aggressive procedures including cardiac catheterization. Medical therapy was recommended. Followup renal function on 11/15/13 BUN was 34 creatinine 1.36 which was better. Creatinine was 1.74 at discharge on 11/07/13.   I saw her on 11/22/13 and she was doing well except for will weakness. Most recent creatinine was 1.3 on 11/29/13. She to visit  is here with her son-in-law, who says she is doing quite well. The patient is getting stronger every day. She continues to walk with a walker. She gets a little out of breath with exertion. She doesn't do a lot. She denies any chest pain, palpitations, dizziness, or presyncope. They've had trouble obtaining TED hose at the pharmacy.     Allergies: -- Ace Inhibitors   -- Codeine    --  dysuria  -- Lisinopril -- Swelling and   -- Morphine And Related    --  dysuria  -- Percocet [Oxycodone-Acetaminophen]    --  dysuria  -- Vitamin C    --  dysuria  -- Vitamin D Analogs    --  Large doses cause dysuria  Current Outpatient Prescriptions on File Prior to Visit: acetaminophen (TYLENOL) 325 MG tablet, Take 650 mg by mouth every 6 (six) hours as needed for mild pain. , Disp: , Rfl:  aspirin EC 81 MG EC tablet, Take 1 tablet (81 mg total) by mouth daily., Disp: , Rfl:  B Complex Vitamins (VITAMIN-B COMPLEX PO), Take 1 tablet by mouth daily. , Disp: , Rfl:  carvedilol (COREG) 3.125 MG tablet, TAKE 1 TABLET BY MOUTH TWICE A DAY WITH A MEAL, Disp: 60 tablet, Rfl: 1 Cholecalciferol (VITAMIN D) 2000  UNITS CAPS, Take 2,000 Units by mouth daily., Disp: , Rfl:  furosemide (LASIX) 40 MG tablet, Take 0.5 tablets (20 mg total) by mouth daily., Disp: 30 tablet, Rfl: 0 HYDROcodone-acetaminophen (NORCO) 5-325 MG per tablet, 1/2-1 pill twice daily as needed for pain, Disp: 60 tablet, Rfl: 0 levothyroxine (SYNTHROID, LEVOTHROID) 50 MCG tablet, 1/2 pill ...except Tues, Thurs, Disp: , Rfl:  losartan (COZAAR) 25 MG tablet, Take 1 tablet (25 mg total) by mouth daily., Disp: 30 tablet, Rfl: 3 MAGNESIUM PO, Take 1 tablet by mouth daily. , Disp: , Rfl:  Omega-3 Fatty Acids (FISH OIL PO), Take 1 capsule by mouth daily. , Disp: , Rfl:  OVER THE COUNTER MEDICATION, Take 1 tablet by mouth daily. Tumeric, Disp: , Rfl:  OVER THE COUNTER MEDICATION, Take 1 tablet by mouth daily. Herbavision-Lutin-Bilberry, Disp: , Rfl:  Probiotic Product (PROBIOTIC DAILY PO), Take 1 tablet by mouth daily. , Disp: , Rfl:  sertraline (ZOLOFT) 100 MG tablet, Take 50 mg by mouth daily. , Disp: , Rfl:  VITAMIN K PO, Take 1 tablet by mouth daily. , Disp: , Rfl:   No current facility-administered medications on file prior to visit.   Past Medical History:   Hyperlipidemia  Hypertension                                                 Type II or unspecified type diabetes mellitus *              Diabetic neuropathy                                          Chronic kidney disease                                       GERD (gastroesophageal reflux disease)                       Osteoporosis                                                Past Surgical History:   NEPHRECTOMY                                     Right 1959         BREAST LUMPECTOMY                                               Comment:1987,1992,2005   BLADDER SURGERY                                  1984         COLOSTOMY                                        2004           Comment:Reversed 2005   EYE SURGERY                                       2008 and *  Review of patient's family history indicates:   Heart disease                  Father                   Heart disease                  Sister                   Diabetes                       Daughter                 Hypertension  Son                      Social History   Marital Status: Widowed             Spouse Name:                      Years of Education:                 Number of children: 2           Occupational History   None on file  Social History Main Topics   Smoking Status: Never Smoker                     Smokeless Status: Not on file                      Alcohol Use: Yes               Comment: Occ wine states she is going to stop   Drug Use: No             Sexual Activity: No                 Other Topics            Concern   None on file  Social History Narrative   None on file    ROS: see history of present illness otherwise negative    PHYSICAL EXAM: Well-nournished, in no acute distress. Neck: No JVD, HJR, Bruit, or thyroid enlargement  Lungs: No tachypnea, clear without wheezing, rales, or rhonchi  Cardiovascular: RRR, PMI not displaced,  2/6 systolic murmur at the left sternal and apex, no gallops, bruit, thrill, or heave.  Abdomen: BS normal. Soft without organomegaly, masses, lesions or tenderness.  Extremities:  bilateral ankle edema, left greater than right, chronic, otherwise lower extremities without cyanosis, clubbing or edema. Good distal pulses bilateral  SKin: Warm, no lesions or rashes   Musculoskeletal: No deformities  Neuro: no focal signs  There were no vitals taken for this visit.     Results for [name], Ashley H (MRN [id number]) as of 12/06/2013 09:42  Ref. Range 11/29/2013 10:31  Sodium Latest Range: 135-145 mEq/L 137  Potassium Latest Range: 3.5-5.1 mEq/L 4.5  Chloride Latest Range: 96-112 mEq/L 97  CO2 Latest Range: 19-32 mEq/L 31  BUN Latest Range: 6-23 mg/dL 32 (H)   Creatinine Latest Range: 0.4-1.2 mg/dL 1.3 (H)  Calcium Latest Range: 8.4-10.5 mg/dL 9.0  Glucose Latest Range: 70-99 mg/dL 956107 (H)  GFR Latest Range: >60.00 mL/min 51.30 (L)

## 2013-12-06 NOTE — Assessment & Plan Note (Addendum)
Heart failure is very well compensated. No evidence on exam today. Titrate Coreg up to 6.25 mg twice a day

## 2013-12-06 NOTE — Patient Instructions (Signed)
Your physician has recommended you make the following change in your medication:  1. Increase carvedilol to 6.25 mg twice daily  Your physician recommends that you schedule a follow-up appointment in: 6 weeks with Dr. Jens Somrenshaw

## 2013-12-18 ENCOUNTER — Encounter: Payer: Self-pay | Admitting: Physician Assistant

## 2013-12-20 ENCOUNTER — Encounter: Payer: Self-pay | Admitting: Physician Assistant

## 2013-12-21 ENCOUNTER — Ambulatory Visit (INDEPENDENT_AMBULATORY_CARE_PROVIDER_SITE_OTHER): Payer: PRIVATE HEALTH INSURANCE | Admitting: Internal Medicine

## 2013-12-21 ENCOUNTER — Encounter: Payer: Self-pay | Admitting: Internal Medicine

## 2013-12-21 ENCOUNTER — Other Ambulatory Visit: Payer: Self-pay | Admitting: Internal Medicine

## 2013-12-21 VITALS — BP 126/60 | HR 68 | Temp 98.1°F | Resp 16 | Ht 63.0 in | Wt 143.2 lb

## 2013-12-21 DIAGNOSIS — I10 Essential (primary) hypertension: Secondary | ICD-10-CM

## 2013-12-21 DIAGNOSIS — I824Z9 Acute embolism and thrombosis of unspecified deep veins of unspecified distal lower extremity: Secondary | ICD-10-CM

## 2013-12-21 DIAGNOSIS — I824Z2 Acute embolism and thrombosis of unspecified deep veins of left distal lower extremity: Secondary | ICD-10-CM

## 2013-12-21 DIAGNOSIS — E119 Type 2 diabetes mellitus without complications: Secondary | ICD-10-CM

## 2013-12-21 DIAGNOSIS — Z79899 Other long term (current) drug therapy: Secondary | ICD-10-CM

## 2013-12-21 LAB — CBC WITH DIFFERENTIAL/PLATELET
Basophils Absolute: 0 10*3/uL (ref 0.0–0.1)
Basophils Relative: 0 % (ref 0–1)
EOS PCT: 2 % (ref 0–5)
Eosinophils Absolute: 0.1 10*3/uL (ref 0.0–0.7)
HCT: 42.1 % (ref 36.0–46.0)
Hemoglobin: 14.1 g/dL (ref 12.0–15.0)
LYMPHS ABS: 1.3 10*3/uL (ref 0.7–4.0)
Lymphocytes Relative: 32 % (ref 12–46)
MCH: 31.9 pg (ref 26.0–34.0)
MCHC: 33.5 g/dL (ref 30.0–36.0)
MCV: 95.2 fL (ref 78.0–100.0)
MONO ABS: 0.3 10*3/uL (ref 0.1–1.0)
MONOS PCT: 7 % (ref 3–12)
Neutro Abs: 2.5 10*3/uL (ref 1.7–7.7)
Neutrophils Relative %: 59 % (ref 43–77)
Platelets: 207 10*3/uL (ref 150–400)
RBC: 4.42 MIL/uL (ref 3.87–5.11)
RDW: 15.2 % (ref 11.5–15.5)
WBC: 4.2 10*3/uL (ref 4.0–10.5)

## 2013-12-21 LAB — PROTIME-INR
INR: 0.97 (ref <1.50)
Prothrombin Time: 12.8 seconds (ref 11.6–15.2)

## 2013-12-21 LAB — HEMOGLOBIN A1C
Hgb A1c MFr Bld: 6.8 % — ABNORMAL HIGH (ref <5.7)
Mean Plasma Glucose: 148 mg/dL — ABNORMAL HIGH (ref <117)

## 2013-12-21 NOTE — Patient Instructions (Signed)
Deep Vein Thrombosis  A deep vein thrombosis (DVT) is a blood clot that develops in the deep, larger veins of the leg, arm, or pelvis. These are more dangerous than clots that might form in veins near the surface of the body. A DVT can lead to complications if the clot breaks off and travels in the bloodstream to the lungs.   A DVT can damage the valves in your leg veins, so that instead of flowing upward, the blood pools in the lower leg. This is called post-thrombotic syndrome, and it can result in pain, swelling, discoloration, and sores on the leg.  CAUSES  Usually, several things contribute to blood clots forming. Contributing factors include:   The flow of blood slows down.   The inside of the vein is damaged in some way.   You have a condition that makes blood clot more easily.  RISK FACTORS  Some people are more likely than others to develop blood clots. Risk factors include:    Older age, especially over 75 years of age.   Having a family history of blood clots or if you have already had a blot clot.   Having major or lengthy surgery. This is especially true for surgery on the hip, knee, or belly (abdomen). Hip surgery is particularly high risk.   Breaking a hip or leg.   Sitting or lying still for a long time. This includes long-distance travel, paralysis, or recovery from an illness or surgery.   Having cancer or cancer treatment.   Having a long, thin tube (catheter) placed inside a vein during a medical procedure.   Being overweight (obese).   Pregnancy and childbirth.   Hormone changes make the blood clot more easily during pregnancy.   The fetus puts pressure on the veins of the pelvis.   There is a risk of injury to veins during delivery or a caesarean. The risk is highest just after childbirth.   Medicines with the female hormone estrogen. This includes birth control pills and hormone replacement therapy.   Smoking.   Other circulation or heart problems.    SIGNS AND SYMPTOMS  When  a clot forms, it can either partially or totally block the blood flow in that vein. Symptoms of a DVT can include:   Swelling of the leg or arm, especially if one side is much worse.   Warmth and redness of the leg or arm, especially if one side is much worse.   Pain in an arm or leg. If the clot is in the leg, symptoms may be more noticeable or worse when standing or walking.  The symptoms of a DVT that has traveled to the lungs (pulmonary embolism, PE) usually start suddenly and include:   Shortness of breath.   Coughing.   Coughing up blood or blood-tinged phlegm.   Chest pain. The chest pain is often worse with deep breaths.   Rapid heartbeat.  Anyone with these symptoms should get emergency medical treatment right away. Call your local emergency services (911 in the [place] if you have these symptoms.  DIAGNOSIS  If a DVT is suspected, your health care provider will take a full medical history and perform a physical exam. Tests that also may be required include:   Blood tests, including studies of the clotting properties of the blood.   Ultrasonography to see if you have clots in your legs or lungs.   X-rays to show the flow of blood when dye is injected into the veins (  venography).   Studies of your lungs if you have any chest symptoms.  PREVENTION   Exercise the legs regularly. Take a brisk 30-minute walk every day.   Maintain a weight that is appropriate for your height.   Avoid sitting or lying in bed for long periods of time without moving your legs.   Women, particularly those over the age of 35 years, should consider the risks and benefits of taking estrogen medicines, including birth control pills.   Do not smoke, especially if you take estrogen medicines.   Long-distance travel can increase your risk of DVT. You should exercise your legs by walking or pumping the muscles every hour.   In-hospital prevention:   Many of the risk factors above relate to situations that exist with  hospitalization, either for illness, injury, or elective surgery.   Your health care provider will assess you for the need for venous thromboembolism prophylaxis when you are admitted to the hospital. If you are having surgery, your surgeon will assess you the day of or day after surgery.   Prevention may include medical and nonmedical measures.  TREATMENT  Once identified, a DVT can be treated. It can also be prevented in some circumstances. Once you have had a DVT, you may be at increased risk for a DVT in the future. The most common treatment for DVT is blood thinning (anticoagulant) medicine, which reduces the blood's tendency to clot. Anticoagulants can stop new blood clots from forming and stop old ones from growing. They cannot dissolve existing clots. Your body does this by itself over time. Anticoagulants can be given by mouth, by IV access, or by injection. Your health care provider will determine the best program for you. Other medicines or treatments that may be used are:   Heparin or related medicines (low molecular weight heparin) are usually the first treatment for a blood clot. They act quickly. However, they cannot be taken orally.   Heparin can cause a fall in a component of blood that stops bleeding and forms blood clots (platelets). You will be monitored with blood tests to be sure this does not occur.   Warfarin is an anticoagulant that can be swallowed. It takes a few days to start working, so usually heparin or related medicines are used in combination. Once warfarin is working, heparin is usually stopped.   Less commonly, clot dissolving drugs (thrombolytics) are used to dissolve a DVT. They carry a high risk of bleeding, so they are used mainly in severe cases, where your life or a limb is threatened.   Very rarely, a blood clot in the leg needs to be removed surgically.   If you are unable to take anticoagulants, your health care provider may arrange for you to have a filter placed  in a main vein in your abdomen. This filter prevents clots from traveling to your lungs.  HOME CARE INSTRUCTIONS   Take all medicines prescribed by your health care provider. Only take over-the-counter or prescription medicines for pain, fever, or discomfort as directed by your health care provider.   Warfarin. Most people will continue taking warfarin after hospital discharge. Your health care provider will advise you on the length of treatment (usually 3 6 months, sometimes lifelong).   Too much and too little warfarin are both dangerous. Too much warfarin increases the risk of bleeding. Too little warfarin continues to allow the risk for blood clots. While taking warfarin, you will need to have regular blood tests to measure your   blood clotting time. These blood tests usually include both the prothrombin time (PT) and international normalized ratio (INR) tests. The PT and INR results allow your health care provider to adjust your dose of warfarin. The dose can change for many reasons. It is critically important that you take warfarin exactly as prescribed, and that you have your PT and INR levels drawn exactly as directed.   Many foods, especially foods high in vitamin K, can interfere with warfarin and affect the PT and INR results. Foods high in vitamin K include spinach, kale, broccoli, cabbage, collard and turnip greens, brussel sprouts, peas, cauliflower, seaweed, and parsley as well as beef and pork liver, green tea, and soybean oil. You should eat a consistent amount of foods high in vitamin K. Avoid major changes in your diet, or notify your health care provider before changing your diet. Arrange a visit with a dietitian to answer your questions.   Many medicines can interfere with warfarin and affect the PT and INR results. You must tell your health care provider about any and all medicines you take. This includes all vitamins and supplements. Be especially cautious with aspirin and  anti-inflammatory medicines. Ask your health care provider before taking these. Do not take or discontinue any prescribed or over-the-counter medicine except on the advice of your health care provider or pharmacist.   Warfarin can have side effects, primarily excessive bruising or bleeding. You will need to hold pressure over cuts for longer than usual. Your health care provider or pharmacist will discuss other potential side effects.   Alcohol can change the body's ability to handle warfarin. It is best to avoid alcoholic drinks or consume only very small amounts while taking warfarin. Notify your health care provider if you change your alcohol intake.   Notify your dentist or other health care providers before procedures.   Activity. Ask your health care provider how soon you can go back to normal activities. It is important to stay active to prevent blood clots. If you are on anticoagulant medicine, avoid contact sports.   Exercise. It is very important to exercise. This is especially important while traveling, sitting, or standing for long periods of time. Exercise your legs by walking or by pumping the muscles frequently. Take frequent walks.   Compression stockings. These are tight elastic stockings that apply pressure to the lower legs. This pressure can help keep the blood in the legs from clotting. You may need to wear compression stockings at home to help prevent a DVT.   Do not smoke. If you smoke, quit. Ask your health care provider for help with quitting smoking.   Learn as much as you can about DVT. Knowing more about the condition should help you keep it from coming back.   Wear a medical alert bracelet or carry a medical alert card.  SEEK MEDICAL CARE IF:   You notice a rapid heartbeat.   You feel weaker or more tired than usual.   You feel faint.   You notice increased bruising.   You feel your symptoms are not getting better in the time expected.   You believe you are having side  effects of medicine.  SEEK IMMEDIATE MEDICAL CARE IF:   You have chest pain.   You have trouble breathing.   You have new or increased swelling or pain in one leg.   You cough up blood.   You notice blood in vomit, in a bowel movement, or in urine.  MAKE SURE   YOU:   Understand these instructions.   Will watch your condition.   Will get help right away if you are not doing well or get worse.  Document Released: 07/22/2005 Document Revised: 05/12/2013 Document Reviewed: 03/29/2013  [employer] Patient Information 2014 [employer]

## 2013-12-21 NOTE — Progress Notes (Signed)
Subjective:    Patient ID: [name], female    DOB: April 02, 1922, 80 y.o.   MRN: [id number]  HPI Very Nice 80 yo WWF w/Hx/o HTN, ASHD/CHF, PreDM, CKD, Hypothyroidism brought in by son with 4-5 day Hx/o assymetric swelling of lower extremities (L>R) w/o other CV complaints. Patient's son has doubled her lasix dose (with instructions) w/o benefit.   Medication List       This list is accurate as of: 12/21/13  7:33 PM.  Always use your most recent med list.               aspirin 81 MG EC tablet  Take 1 tablet (81 mg total) by mouth daily.     carvedilol 6.25 MG tablet  Commonly known as:  COREG  Take 1 tablet (6.25 mg total) by mouth 2 (two) times daily with a meal.     FISH OIL PO  Take 1 capsule by mouth daily.     furosemide 40 MG tablet  Commonly known as:  LASIX  Take 0.5 tablets (20 mg total) by mouth daily.     HYDROcodone-acetaminophen 5-325 MG per tablet  Commonly known as:  NORCO  1/2-1 pill twice daily as needed for pain     levothyroxine 50 MCG tablet  Commonly known as:  SYNTHROID, LEVOTHROID  1/2 pill ...except Tues, Thurs     losartan 25 MG tablet  Commonly known as:  COZAAR  Take 1 tablet (25 mg total) by mouth daily.     MAGNESIUM PO  Take 1 tablet by mouth daily.     OVER THE COUNTER MEDICATION  Take 1 tablet by mouth daily. Tumeric     OVER THE COUNTER MEDICATION  Take 1 tablet by mouth daily. Herbavision-Lutin-Bilberry     PROBIOTIC DAILY PO  Take 1 tablet by mouth daily.     sertraline 100 MG tablet  Commonly known as:  ZOLOFT  Take 50 mg by mouth daily.     Vitamin D 2000 UNITS Caps  Take 2,000 Units by mouth daily.     VITAMIN-B COMPLEX PO  Take 1 tablet by mouth daily.       Allergies  Allergen Reactions  . Ace Inhibitors   . Codeine     dysuria  . Lisinopril Swelling  . Morphine And Related     dysuria  . Percocet [Oxycodone-Acetaminophen]     dysuria  . Vitamin C     dysuria  . Vitamin D Analogs     Large  doses cause dysuria   Past Medical History  Diagnosis Date  . Hyperlipidemia   . Hypertension   . Type II or unspecified type diabetes mellitus without mention of complication, not stated as uncontrolled   . Diabetic neuropathy   . Chronic kidney disease   . GERD (gastroesophageal reflux disease)   . Osteoporosis    Review of Systems In addition to the HPI above,  No Fever-chills,  No Headache, No changes with Vision or hearing,  No problems swallowing food or Liquids,  No Chest pain or productive Cough or Shortness of Breath,  No Abdominal pain, No Nausea or Vommitting,  No dysuria,  No new skin rashes or bruises,  No new joints pains-aches,  No new weakness, tingling, numbness in any extremity,  No polyuria, polydypsia or polyphagia,   A full 10 point Review of Systems was done, except as stated above, all other Review of Systems were negative  Objective:   Physical Exam  BP 126/60  Pulse 68  Temp 98.1 F   Resp 16  Ht 5\' 3"    Wt 143 lb 3.2 oz   BMI 25.37 kg/m2  HEENT - Eac's patent. TM's Nl.EOM's full. PERRLA. NasoOroPharynx clear. Neck - supple. Nl Thyroid. No bruits nodes JVD Chest - Clear equal BS. No stridor.  Cor - Nl HS. RRR w/o sig MGR. PP obscured by  Edema. Toes warm with Nl cap. Refill. There is 2-3(+) pitting edema of the Leftshin/calf and 1-2(+) Rt. No cords are palpable and homan's Sn is negative . Abd - No palpable organomegaly, masses or tenderness. BS nl. MS- FROM. w/o deformities. Muscle power tone and bulk Nl. Gait Nl. Neuro - No obvious Cr N abnormalities. Sensory, motor and Cerebellar functions appear Nl w/o focal abnormalities.  Assessment & Plan:   1. Hypertension - TSH  2. Type II or unspecified type diabetes mellitus without mention of complication, not stated as uncontrolled - Hemoglobin A1c - Insulin, fasting  3. Acute deep vein thrombosis (DVT) of distal vein of left lower extremity - Protime-INR - US Venous Img Lower Unilateral  Left; in Am to r/o DVT  4. Encounter for long-term (current) use of other medications - CBC with Differential - BASIC METABOLIC PANEL WITH GFR - Hepatic function panel - Magnesium - Urine Microscopic  Patient was given Xarelto 15 mg and starter supply for bid for 1 week to her caretaker son presumptive that this represents an acute DVT.

## 2013-12-22 ENCOUNTER — Other Ambulatory Visit: Payer: Self-pay | Admitting: Internal Medicine

## 2013-12-22 ENCOUNTER — Encounter: Payer: Self-pay | Admitting: Physician Assistant

## 2013-12-22 ENCOUNTER — Ambulatory Visit
Admission: RE | Admit: 2013-12-22 | Discharge: 2013-12-22 | Disposition: A | Discharge status: Home / Self Care | Payer: Medicare Other | Source: Ambulatory Visit | Attending: Internal Medicine | Admitting: Internal Medicine

## 2013-12-22 DIAGNOSIS — N3000 Acute cystitis without hematuria: Secondary | ICD-10-CM

## 2013-12-22 DIAGNOSIS — I824Z2 Acute embolism and thrombosis of unspecified deep veins of left distal lower extremity: Secondary | ICD-10-CM

## 2013-12-22 LAB — HEPATIC FUNCTION PANEL
ALBUMIN: 3.8 g/dL (ref 3.5–5.2)
ALT: 12 U/L (ref 0–35)
AST: 23 U/L (ref 0–37)
Alkaline Phosphatase: 41 U/L (ref 39–117)
Bilirubin, Direct: 0.2 mg/dL (ref 0.0–0.3)
Indirect Bilirubin: 0.7 mg/dL (ref 0.2–1.2)
Total Bilirubin: 0.9 mg/dL (ref 0.2–1.2)
Total Protein: 6.4 g/dL (ref 6.0–8.3)

## 2013-12-22 LAB — BASIC METABOLIC PANEL WITH GFR
BUN: 37 mg/dL — AB (ref 6–23)
CALCIUM: 9.4 mg/dL (ref 8.4–10.5)
CO2: 30 meq/L (ref 19–32)
CREATININE: 1.36 mg/dL — AB (ref 0.50–1.10)
Chloride: 100 mEq/L (ref 96–112)
GFR, Est African American: 40 mL/min — ABNORMAL LOW
GFR, Est Non African American: 34 mL/min — ABNORMAL LOW
GLUCOSE: 106 mg/dL — AB (ref 70–99)
Potassium: 4.7 mEq/L (ref 3.5–5.3)
Sodium: 140 mEq/L (ref 135–145)

## 2013-12-22 LAB — URINALYSIS, MICROSCOPIC ONLY
CRYSTALS: NONE SEEN
Casts: NONE SEEN

## 2013-12-22 LAB — INSULIN, FASTING: Insulin fasting, serum: 4 u[IU]/mL (ref 3–28)

## 2013-12-22 LAB — MAGNESIUM: MAGNESIUM: 2.6 mg/dL — AB (ref 1.5–2.5)

## 2013-12-22 LAB — TSH: TSH: 1.441 u[IU]/mL (ref 0.350–4.500)

## 2013-12-24 LAB — URINE CULTURE: Colony Count: 75000

## 2013-12-27 ENCOUNTER — Other Ambulatory Visit: Payer: Self-pay | Admitting: Internal Medicine

## 2013-12-27 ENCOUNTER — Encounter: Payer: Self-pay | Admitting: Internal Medicine

## 2013-12-27 DIAGNOSIS — N3000 Acute cystitis without hematuria: Secondary | ICD-10-CM

## 2013-12-27 MED ORDER — SULFAMETHOXAZOLE-TRIMETHOPRIM 400-80 MG PO TABS: 1.0000 | ORAL_TABLET | Freq: Two times a day (BID) | ORAL | Status: AC

## 2013-12-28 ENCOUNTER — Encounter: Payer: Self-pay | Admitting: Physician Assistant

## 2013-12-29 ENCOUNTER — Encounter: Payer: Self-pay | Admitting: Internal Medicine

## 2014-01-07 ENCOUNTER — Encounter: Payer: Self-pay | Admitting: Physician Assistant

## 2014-01-07 ENCOUNTER — Ambulatory Visit (INDEPENDENT_AMBULATORY_CARE_PROVIDER_SITE_OTHER): Payer: Medicare Other | Admitting: Physician Assistant

## 2014-01-07 VITALS — BP 128/78 | HR 60 | Temp 98.6°F | Resp 16 | Wt 134.0 lb

## 2014-01-07 DIAGNOSIS — I10 Essential (primary) hypertension: Secondary | ICD-10-CM

## 2014-01-07 DIAGNOSIS — E1140 Type 2 diabetes mellitus with diabetic neuropathy, unspecified: Secondary | ICD-10-CM

## 2014-01-07 DIAGNOSIS — E1149 Type 2 diabetes mellitus with other diabetic neurological complication: Secondary | ICD-10-CM

## 2014-01-07 DIAGNOSIS — E119 Type 2 diabetes mellitus without complications: Secondary | ICD-10-CM

## 2014-01-07 DIAGNOSIS — N189 Chronic kidney disease, unspecified: Secondary | ICD-10-CM

## 2014-01-07 DIAGNOSIS — M109 Gout, unspecified: Secondary | ICD-10-CM

## 2014-01-07 DIAGNOSIS — E039 Hypothyroidism, unspecified: Secondary | ICD-10-CM

## 2014-01-07 DIAGNOSIS — N3000 Acute cystitis without hematuria: Secondary | ICD-10-CM

## 2014-01-07 MED ORDER — GABAPENTIN 100 MG PO CAPS: ORAL_CAPSULE | ORAL | Status: DC

## 2014-01-07 NOTE — Patient Instructions (Addendum)
1) she can have the shrimp and some processed foods, the biggest thing is keeping an eye on her weight. If that stays stable then I'm okay with it.   2) I will check her kidney function today to see if she can go back to 1/2 of the lasix. If she ever gets dizzy decrease to 1/2 the lasix and sometimes I have patients alternate between 1 pill and 1/2 a pill every other day, so that is a potential option.   3) She is diabetic but she can eat her dark chocolate. Quality of life is important.   She is only on a low dose aspirin and the fish oil can cause thin blood, but it is okay to take the tumeric IF it is helping. With her over the counter medications can start to eliminate medications one by one and see if they effect.   We are going to check for a uric acid for gout potential and I think the leg pain she has is more nerve pain so we are going to try Gabapentin rather than the hydrocodone at night.  I would start out on 1 or two of the gabapentin at night, she can continue to take the 1/2 of the hydrocodone in the morning during this time. Due this for 2-4 weeks and she can take up to 300mg  at night and 1-2 in th morning of the gabapentin for pain if needed. I'm hoping this will replace the hydrocodone. If it does not work or she has issues go back to the hydrocodone.   Also with her memory, we are going to check her labs if those are all normal we may cut back on her Coreg to 1/2 a pill two times daily to help with the perfusion to her brain.   Do the following things EVERYDAY: 1) Weigh yourself in the morning before breakfast. Write it down and keep it in a log. 2) Take your medicines as prescribed 3) Eat low salt foods-Limit salt (sodium) to 2000 mg per day.  4) Stay as active as you can everyday 5) Limit all fluids for the day to less than 2 liters   Hope this helps.

## 2014-01-07 NOTE — Progress Notes (Signed)
   Subjective:    Patient ID: [name], female    DOB: [birth year], 80 y.o.   MRN: [id number]  HPI 80 y.o. female with history of CHF presents for follow up. She had acute swelling of her left leg last visit, DVT ruled out. Her lasix was increased to one pill daily and she has been elevated her leg. She states that is has improved, still some pain at her left ankle with swelling, redness. She also complains of tingling/needle sensation bilateral feet to her mid shin. She denies SOB, orthopnea, PND and her weight has been stable. She has a very supportive family.    Review of Systems  Constitutional: Negative for fever, diaphoresis, activity change, appetite change and fatigue.  HENT: Negative.   Eyes: Negative.   Respiratory: Negative for apnea, cough, chest tightness, shortness of breath and wheezing.   Cardiovascular: Positive for leg swelling. Negative for chest pain and palpitations.  Gastrointestinal: Negative.   Genitourinary: Positive for frequency. Negative for dysuria, urgency, flank pain, decreased urine volume, vaginal bleeding, vaginal discharge, difficulty urinating, vaginal pain and pelvic pain.  Musculoskeletal: Negative.   Neurological: Negative.        Objective:   Physical Exam  Constitutional: She appears well-developed and well-nourished.  HENT:  Head: Normocephalic and atraumatic.  Eyes: Conjunctivae are normal. Pupils are equal, round, and reactive to light.  Neck: Normal range of motion. Neck supple.  Cardiovascular: Normal rate.   Pulmonary/Chest: Effort normal.  Abdominal: Soft. Bowel sounds are normal. She exhibits no distension. There is no tenderness.  Musculoskeletal: Normal range of motion. She exhibits edema (1+ right leg and 2+ edema in her left leg with some erythema, no weeping, warmth).  Lymphadenopathy:    She has no cervical adenopathy.  Neurological:  Decreased sensation bilateral feet to her mid shin, no ulcers  Skin: Skin is warm and  dry.  Psychiatric: She has a normal mood and affect. Judgment and thought content normal.      Assessment & Plan:  Edema- DVT ruled out- ? Gout vs venous insufficiency (states she has injured that leg in the distant past)- continue lasix once daily, will check kidney function CHF- If any increasing shortness of breath, swelling, or chest pressure go to ER immediately. Decrease your sodium intake to less than 2000 mg daily, decrease your fluid intake to less than 2 L daily, elevate legs, weight yourself daily and please remember to always increase your potassium intake with any increase of your fluid pill.  DM neuropathy- stop norco and try gabapentin 100mg - will start very slow UTI- check urine Memory issues- check labs, if everything is negative ? Need to decrease coreg for better perfusion.  Hypothyroidism-check TSH level, continue medications the same.   OVER 30 minutes of exam, counseling, chart review

## 2014-01-09 ENCOUNTER — Encounter: Payer: Self-pay | Admitting: Physician Assistant

## 2014-01-10 ENCOUNTER — Other Ambulatory Visit: Payer: Medicare Other

## 2014-01-10 ENCOUNTER — Other Ambulatory Visit: Payer: Self-pay | Admitting: Physician Assistant

## 2014-01-10 DIAGNOSIS — M25579 Pain in unspecified ankle and joints of unspecified foot: Secondary | ICD-10-CM

## 2014-01-10 DIAGNOSIS — I10 Essential (primary) hypertension: Secondary | ICD-10-CM

## 2014-01-10 DIAGNOSIS — N3000 Acute cystitis without hematuria: Secondary | ICD-10-CM

## 2014-01-10 DIAGNOSIS — Z79899 Other long term (current) drug therapy: Secondary | ICD-10-CM

## 2014-01-10 LAB — BASIC METABOLIC PANEL WITH GFR
BUN: 38 mg/dL — AB (ref 6–23)
CALCIUM: 9 mg/dL (ref 8.4–10.5)
CO2: 30 meq/L (ref 19–32)
Chloride: 102 mEq/L (ref 96–112)
Creat: 1.4 mg/dL — ABNORMAL HIGH (ref 0.50–1.10)
GFR, EST AFRICAN AMERICAN: 38 mL/min — AB
GFR, Est Non African American: 33 mL/min — ABNORMAL LOW
Glucose, Bld: 115 mg/dL — ABNORMAL HIGH (ref 70–99)
Potassium: 4.9 mEq/L (ref 3.5–5.3)
SODIUM: 139 meq/L (ref 135–145)

## 2014-01-10 LAB — CBC WITH DIFFERENTIAL/PLATELET
BASOS PCT: 1 % (ref 0–1)
Basophils Absolute: 0 10*3/uL (ref 0.0–0.1)
EOS ABS: 0.1 10*3/uL (ref 0.0–0.7)
Eosinophils Relative: 3 % (ref 0–5)
HEMATOCRIT: 42 % (ref 36.0–46.0)
HEMOGLOBIN: 14.4 g/dL (ref 12.0–15.0)
LYMPHS ABS: 1.4 10*3/uL (ref 0.7–4.0)
Lymphocytes Relative: 36 % (ref 12–46)
MCH: 32.4 pg (ref 26.0–34.0)
MCHC: 34.3 g/dL (ref 30.0–36.0)
MCV: 94.4 fL (ref 78.0–100.0)
MONO ABS: 0.2 10*3/uL (ref 0.1–1.0)
MONOS PCT: 5 % (ref 3–12)
Neutro Abs: 2.1 10*3/uL (ref 1.7–7.7)
Neutrophils Relative %: 55 % (ref 43–77)
Platelets: 216 10*3/uL (ref 150–400)
RBC: 4.45 MIL/uL (ref 3.87–5.11)
RDW: 15 % (ref 11.5–15.5)
WBC: 3.9 10*3/uL — ABNORMAL LOW (ref 4.0–10.5)

## 2014-01-10 LAB — MAGNESIUM: MAGNESIUM: 2.3 mg/dL (ref 1.5–2.5)

## 2014-01-10 LAB — HEPATIC FUNCTION PANEL
ALT: 13 U/L (ref 0–35)
AST: 22 U/L (ref 0–37)
Albumin: 3.6 g/dL (ref 3.5–5.2)
Alkaline Phosphatase: 39 U/L (ref 39–117)
BILIRUBIN DIRECT: 0.1 mg/dL (ref 0.0–0.3)
BILIRUBIN INDIRECT: 0.5 mg/dL (ref 0.2–1.2)
Total Bilirubin: 0.6 mg/dL (ref 0.2–1.2)
Total Protein: 5.9 g/dL — ABNORMAL LOW (ref 6.0–8.3)

## 2014-01-10 LAB — URIC ACID: URIC ACID, SERUM: 7.9 mg/dL — AB (ref 2.4–7.0)

## 2014-01-10 LAB — TSH: TSH: 1.196 u[IU]/mL (ref 0.350–4.500)

## 2014-01-10 MED ORDER — SERTRALINE HCL 100 MG PO TABS: 50.0000 mg | ORAL_TABLET | Freq: Every day | ORAL | Status: DC

## 2014-01-11 ENCOUNTER — Other Ambulatory Visit: Payer: Self-pay | Admitting: Physician Assistant

## 2014-01-11 ENCOUNTER — Encounter: Payer: Self-pay | Admitting: Physician Assistant

## 2014-01-11 LAB — URINALYSIS, ROUTINE W REFLEX MICROSCOPIC
BILIRUBIN URINE: NEGATIVE
Glucose, UA: NEGATIVE mg/dL
HGB URINE DIPSTICK: NEGATIVE
Ketones, ur: NEGATIVE mg/dL
Nitrite: NEGATIVE
PH: 6 (ref 5.0–8.0)
Protein, ur: NEGATIVE mg/dL
SPECIFIC GRAVITY, URINE: 1.011 (ref 1.005–1.030)
Urobilinogen, UA: 0.2 mg/dL (ref 0.0–1.0)

## 2014-01-11 LAB — URINALYSIS, MICROSCOPIC ONLY
Bacteria, UA: NONE SEEN
CRYSTALS: NONE SEEN
Casts: NONE SEEN

## 2014-01-11 LAB — URINE CULTURE
Colony Count: NO GROWTH
ORGANISM ID, BACTERIA: NO GROWTH

## 2014-01-11 MED ORDER — PREDNISONE 20 MG PO TABS: ORAL_TABLET | ORAL | Status: DC

## 2014-01-11 MED ORDER — ALLOPURINOL 100 MG PO TABS: 100.0000 mg | ORAL_TABLET | Freq: Every day | ORAL | Status: DC

## 2014-01-11 NOTE — Addendum Note (Signed)
Addended by: Quentin Mulling R on: 01/11/2014 08:17 AM   Modules accepted: Orders

## 2014-02-02 ENCOUNTER — Other Ambulatory Visit: Payer: Self-pay | Admitting: Physician Assistant

## 2014-02-02 MED ORDER — LEVOTHYROXINE SODIUM 50 MCG PO TABS: ORAL_TABLET | ORAL | Status: DC

## 2014-02-07 ENCOUNTER — Ambulatory Visit (INDEPENDENT_AMBULATORY_CARE_PROVIDER_SITE_OTHER): Payer: Medicare Other | Admitting: Cardiology

## 2014-02-07 ENCOUNTER — Encounter: Payer: Self-pay | Admitting: Cardiology

## 2014-02-07 VITALS — BP 130/60 | HR 60 | Wt 138.8 lb

## 2014-02-07 DIAGNOSIS — N183 Chronic kidney disease, stage 3 unspecified: Secondary | ICD-10-CM

## 2014-02-07 DIAGNOSIS — I5022 Chronic systolic (congestive) heart failure: Secondary | ICD-10-CM

## 2014-02-07 DIAGNOSIS — E785 Hyperlipidemia, unspecified: Secondary | ICD-10-CM

## 2014-02-07 DIAGNOSIS — I509 Heart failure, unspecified: Secondary | ICD-10-CM

## 2014-02-07 DIAGNOSIS — I10 Essential (primary) hypertension: Secondary | ICD-10-CM

## 2014-02-07 NOTE — Assessment & Plan Note (Signed)
Blood pressure controlled. Continue present medications. 

## 2014-02-07 NOTE — Patient Instructions (Signed)
Your physician wants you to follow-up in: 3 MONTHS WITH DR CRENSHAW You will receive a reminder letter in the mail two months in advance. If you don't receive a letter, please call our office to schedule the follow-up appointment.   Your physician recommends that you HAVE LAB WORK TODAY 

## 2014-02-07 NOTE — Assessment & Plan Note (Signed)
Check potassium and renal function. 

## 2014-02-07 NOTE — Assessment & Plan Note (Signed)
Patient has chronic systolic congestive heart failure. She did not want further cardiac testing but preferred only medical therapy which is appropriate. Continue ARB and beta blocker. Continue present dose of Lasix. Check potassium and renal function.

## 2014-02-07 NOTE — Progress Notes (Signed)
HPI: FU CHF. Admitted with CHF and pneumonia in April 2015. Echocardiogram 4/15 revealed severe systolic dysfunction EF 25-30% biatrial enlargement and moderate mitral regurgitation and severely elevated pulmonary pressures. Small pericardial effusion. She was treated with IV Lasix and renal function was followed closely given history of nephrectomy and baseline renal insufficiency. Cozaar was started but then stopped because of increased creatinine. She did not want aggressive procedures including cardiac catheterization. Medical therapy was recommended. Venous dopplers 5/15 showed no DVT. Since she was last seen, She has some dyspnea on exertion with more prolonged activities. No orthopnea, PND, chest pain or syncope. Minimal pedal edema.    Current Outpatient Prescriptions  Medication Sig Dispense Refill  . allopurinol (ZYLOPRIM) 100 MG tablet Take 1 tablet (100 mg total) by mouth daily.  30 tablet  2  . aspirin EC 81 MG EC tablet Take 1 tablet (81 mg total) by mouth daily.      . B Complex Vitamins (VITAMIN-B COMPLEX PO) Take 1 tablet by mouth daily.       . carvedilol (COREG) 6.25 MG tablet Take 1 tablet (6.25 mg total) by mouth 2 (two) times daily with a meal.  60 tablet  1  . Cholecalciferol (VITAMIN D) 2000 UNITS CAPS Take 2,000 Units by mouth daily.      . furosemide (LASIX) 40 MG tablet Take 0.5 tablets (20 mg total) by mouth daily.  30 tablet  0  . gabapentin (NEURONTIN) 100 MG capsule 1 in the morning and 2-3 at night for pain  120 capsule  2  . HYDROcodone-acetaminophen (NORCO) 5-325 MG per tablet 1/2-1 pill twice daily as needed for pain  60 tablet  0  . levothyroxine (SYNTHROID, LEVOTHROID) 50 MCG tablet 1/2 tablet daily except take 1 tablet Tues, Thurs  90 tablet  2  . losartan (COZAAR) 25 MG tablet Take 1 tablet (25 mg total) by mouth daily.  30 tablet  3  . MAGNESIUM PO Take 1 tablet by mouth daily.       . Menaquinone-7 (VITAMIN K2 PO) Take 50 mcg by mouth every other  day.      . Omega-3 Fatty Acids (FISH OIL PO) Take 1 capsule by mouth daily.       Marland Kitchen. OVER THE COUNTER MEDICATION Take 1 tablet by mouth daily. Tumeric      . OVER THE COUNTER MEDICATION Take 1 tablet by mouth daily. Herbavision-Lutin-Bilberry      . predniSONE (DELTASONE) 20 MG tablet 3 tablets daily with food for 3 days, 2 tabs daily for 3 days, 1 tab a day for 5 days.  20 tablet  0  . Probiotic Product (PROBIOTIC DAILY PO) Take 1 tablet by mouth daily.       . sertraline (ZOLOFT) 100 MG tablet Take 0.5 tablets (50 mg total) by mouth daily.  90 tablet  1  . vitamin B-12 (CYANOCOBALAMIN) 100 MCG tablet Take 1,000 mcg by mouth daily.       No current facility-administered medications for this visit.     Past Medical History  Diagnosis Date  . Hyperlipidemia   . Hypertension   . Type II or unspecified type diabetes mellitus without mention of complication, not stated as uncontrolled   . Diabetic neuropathy   . Chronic kidney disease   . GERD (gastroesophageal reflux disease)   . Osteoporosis   . CHF (congestive heart failure)   . Cardiomyopathy     Past Surgical History  Procedure Laterality Date  .  Nephrectomy Right 1959  . Breast lumpectomy      [phone],[id number],2005  . Bladder surgery  1984  . Colostomy  2004    Reversed 2005  . Eye surgery  2008 and 2009    History   Social History  . Marital Status: Widowed    Spouse Name: N/A    Number of Children: 2  . Years of Education: N/A   Occupational History  . Not on file.   Social History Main Topics  . Smoking status: Never Smoker   . Smokeless tobacco: Not on file  . Alcohol Use: Yes     Comment: Occ wine states she is going to stop  . Drug Use: No  . Sexual Activity: No   Other Topics Concern  . Not on file   Social History Narrative  . No narrative on file    ROS: no fevers or chills, productive cough, hemoptysis, dysphasia, odynophagia, melena, hematochezia, dysuria, hematuria, rash, seizure activity,  orthopnea, PND, pedal edema, claudication. Remaining systems are negative.  Physical Exam: Well-developed frail in no acute distress.  Skin is warm and dry.  HEENT is normal.  Neck is supple.  Chest is clear to auscultation with normal expansion.  Cardiovascular exam is regular rate and rhythm.  Abdominal exam nontender or distended. No masses palpated. Extremities show trace edema. neuro grossly intact

## 2014-02-08 ENCOUNTER — Ambulatory Visit: Payer: Medicare Other | Admitting: Podiatry

## 2014-02-08 ENCOUNTER — Encounter: Payer: Self-pay | Admitting: Podiatry

## 2014-02-08 ENCOUNTER — Ambulatory Visit (INDEPENDENT_AMBULATORY_CARE_PROVIDER_SITE_OTHER): Payer: Medicare Other | Admitting: Podiatry

## 2014-02-08 ENCOUNTER — Encounter: Payer: Self-pay | Admitting: Physician Assistant

## 2014-02-08 VITALS — BP 134/58 | HR 60 | Resp 16

## 2014-02-08 DIAGNOSIS — B351 Tinea unguium: Secondary | ICD-10-CM

## 2014-02-08 DIAGNOSIS — M79609 Pain in unspecified limb: Secondary | ICD-10-CM

## 2014-02-08 DIAGNOSIS — M79604 Pain in right leg: Secondary | ICD-10-CM

## 2014-02-08 DIAGNOSIS — M79605 Pain in left leg: Secondary | ICD-10-CM

## 2014-02-08 LAB — BASIC METABOLIC PANEL WITH GFR
BUN: 32 mg/dL — AB (ref 6–23)
CO2: 32 mEq/L (ref 19–32)
Calcium: 9.3 mg/dL (ref 8.4–10.5)
Chloride: 98 mEq/L (ref 96–112)
Creat: 1.39 mg/dL — ABNORMAL HIGH (ref 0.50–1.10)
GFR, EST AFRICAN AMERICAN: 38 mL/min — AB
GFR, Est Non African American: 33 mL/min — ABNORMAL LOW
Glucose, Bld: 95 mg/dL (ref 70–99)
POTASSIUM: 4.6 meq/L (ref 3.5–5.3)
SODIUM: 138 meq/L (ref 135–145)

## 2014-02-08 NOTE — Patient Instructions (Signed)
Diabetes and Foot Care Diabetes may cause you to have problems because of poor blood supply (circulation) to your feet and legs. This may cause the skin on your feet to become thinner, break easier, and heal more slowly. Your skin may become dry, and the skin may peel and crack. You may also have nerve damage in your legs and feet causing decreased feeling in them. You may not notice minor injuries to your feet that could lead to infections or more serious problems. Taking care of your feet is one of the most important things you can do for yourself.  HOME CARE INSTRUCTIONS  Wear shoes at all times, even in the house. Do not go barefoot. Bare feet are easily injured.  Check your feet daily for blisters, cuts, and redness. If you cannot see the bottom of your feet, use a mirror or ask someone for help.  Wash your feet with warm water (do not use hot water) and mild soap. Then pat your feet and the areas between your toes until they are completely dry. Do not soak your feet as this can dry your skin.  Apply a moisturizing lotion or petroleum jelly (that does not contain alcohol and is unscented) to the skin on your feet and to dry, brittle toenails. Do not apply lotion between your toes.  Trim your toenails straight across. Do not dig under them or around the cuticle. File the edges of your nails with an emery board or nail file.  Do not cut corns or calluses or try to remove them with medicine.  Wear clean socks or stockings every day. Make sure they are not too tight. Do not wear knee-high stockings since they may decrease blood flow to your legs.  Wear shoes that fit properly and have enough cushioning. To break in new shoes, wear them for just a few hours a day. This prevents you from injuring your feet. Always look in your shoes before you put them on to be sure there are no objects inside.  Do not cross your legs. This may decrease the blood flow to your feet.  If you find a minor scrape,  cut, or break in the skin on your feet, keep it and the skin around it clean and dry. These areas may be cleansed with mild soap and water. Do not cleanse the area with peroxide, alcohol, or iodine.  When you remove an adhesive bandage, be sure not to damage the skin around it.  If you have a wound, look at it several times a day to make sure it is healing.  Do not use heating pads or hot water bottles. They may burn your skin. If you have lost feeling in your feet or legs, you may not know it is happening until it is too late.  Make sure your health care provider performs a complete foot exam at least annually or more often if you have foot problems. Report any cuts, sores, or bruises to your health care provider immediately. SEEK MEDICAL CARE IF:   You have an injury that is not healing.  You have cuts or breaks in the skin.  You have an ingrown nail.  You notice redness on your legs or feet.  You feel burning or tingling in your legs or feet.  You have pain or cramps in your legs and feet.  Your legs or feet are numb.  Your feet always feel cold. SEEK IMMEDIATE MEDICAL CARE IF:   There is increasing redness,   swelling, or pain in or around a wound.  There is a red line that goes up your leg.  Pus is coming from a wound.  You develop a fever or as directed by your health care provider.  You notice a bad smell coming from an ulcer or wound. Document Released: 07/19/2000 Document Revised: 03/24/2013 Document Reviewed: 12/29/2012 [employer] Patient Information 2015 [employer]. This information is not intended to replace advice given to you by your health care provider. Make sure you discuss any questions you have with your health care provider.  

## 2014-02-08 NOTE — Progress Notes (Signed)
She presents today with a chief complaint of painful E. elongated toenails one through 5 bilateral.  Objective: Pulses are strongly palpable bilateral. Nails are thick yellow dystrophic onychomycotic and painful palpation.  Assessment: Pain in limb secondary to onychomycosis 1 through 5 bilateral.  Plan: Debridement of nails in thickness and length as a covered service secondary to pain.

## 2014-02-15 ENCOUNTER — Other Ambulatory Visit: Payer: Self-pay

## 2014-02-15 MED ORDER — CARVEDILOL 6.25 MG PO TABS: 6.2500 mg | ORAL_TABLET | Freq: Two times a day (BID) | ORAL | Status: DC

## 2014-02-23 ENCOUNTER — Encounter: Payer: Self-pay | Admitting: Internal Medicine

## 2014-02-27 DIAGNOSIS — E559 Vitamin D deficiency, unspecified: Secondary | ICD-10-CM | POA: Insufficient documentation

## 2014-02-28 ENCOUNTER — Ambulatory Visit (INDEPENDENT_AMBULATORY_CARE_PROVIDER_SITE_OTHER): Payer: Medicare Other | Admitting: Internal Medicine

## 2014-02-28 ENCOUNTER — Encounter: Payer: Self-pay | Admitting: Internal Medicine

## 2014-02-28 ENCOUNTER — Other Ambulatory Visit: Payer: Self-pay | Admitting: Internal Medicine

## 2014-02-28 VITALS — BP 136/62 | HR 52 | Temp 98.2°F | Resp 16 | Ht 63.0 in | Wt 138.0 lb

## 2014-02-28 DIAGNOSIS — E559 Vitamin D deficiency, unspecified: Secondary | ICD-10-CM

## 2014-02-28 DIAGNOSIS — Z789 Other specified health status: Secondary | ICD-10-CM

## 2014-02-28 DIAGNOSIS — E785 Hyperlipidemia, unspecified: Secondary | ICD-10-CM

## 2014-02-28 DIAGNOSIS — Z79899 Other long term (current) drug therapy: Secondary | ICD-10-CM

## 2014-02-28 DIAGNOSIS — E538 Deficiency of other specified B group vitamins: Secondary | ICD-10-CM

## 2014-02-28 DIAGNOSIS — E1129 Type 2 diabetes mellitus with other diabetic kidney complication: Secondary | ICD-10-CM

## 2014-02-28 DIAGNOSIS — I10 Essential (primary) hypertension: Secondary | ICD-10-CM

## 2014-02-28 DIAGNOSIS — Z1212 Encounter for screening for malignant neoplasm of rectum: Secondary | ICD-10-CM

## 2014-02-28 DIAGNOSIS — M109 Gout, unspecified: Secondary | ICD-10-CM

## 2014-02-28 DIAGNOSIS — Z1331 Encounter for screening for depression: Secondary | ICD-10-CM

## 2014-02-28 LAB — CBC WITH DIFFERENTIAL/PLATELET
Basophils Absolute: 0.1 10*3/uL (ref 0.0–0.1)
Basophils Relative: 1 % (ref 0–1)
EOS ABS: 0.2 10*3/uL (ref 0.0–0.7)
Eosinophils Relative: 3 % (ref 0–5)
HCT: 37.7 % (ref 36.0–46.0)
HEMOGLOBIN: 12.5 g/dL (ref 12.0–15.0)
Lymphocytes Relative: 27 % (ref 12–46)
Lymphs Abs: 1.4 10*3/uL (ref 0.7–4.0)
MCH: 32.1 pg (ref 26.0–34.0)
MCHC: 33.2 g/dL (ref 30.0–36.0)
MCV: 96.9 fL (ref 78.0–100.0)
MONOS PCT: 7 % (ref 3–12)
Monocytes Absolute: 0.4 10*3/uL (ref 0.1–1.0)
NEUTROS ABS: 3.2 10*3/uL (ref 1.7–7.7)
Neutrophils Relative %: 62 % (ref 43–77)
Platelets: 223 10*3/uL (ref 150–400)
RBC: 3.89 MIL/uL (ref 3.87–5.11)
RDW: 14.9 % (ref 11.5–15.5)
WBC: 5.2 10*3/uL (ref 4.0–10.5)

## 2014-02-28 MED ORDER — GABAPENTIN 300 MG PO CAPS: ORAL_CAPSULE | ORAL | Status: DC

## 2014-02-28 NOTE — Progress Notes (Signed)
Patient ID: Ashley ShaggyVirginia H [name], female   DOB: [birth year], 80 y.o.   MRN: [id number]   Annual Screening Comprehensive Examination  This very nice 80 y.o.WWF presents for complete physical.  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 Patient also c/o of a query leg discomfort at night keeping her awake which she describes as a soreness of her shins , an ache-but not a cramp in her calves and a tingling or numbness in her distal feet & toes.    HTN predates since 581990. Patient's BP has been controlled at home. Today's BP: 136/62 mmHg. Patient denies any cardiac symptoms as chest pain, palpitations, shortness of breath, dizziness or ankle swelling.   Patient's hyperlipidemia is controlled with diet and medications. Patient denies myalgias or other medication SE's. Last lipids were at goal. Lab Results  Component Value Date   CHOL 164 08/24/2013   HDL 50 08/24/2013   LDLCALC 95 08/24/2013   TRIG 96 08/24/2013   CHOLHDL 3.3 08/24/2013    Patient has T2_NIDDM w/Stage 3 CKD and last A1c was Lab Results  Component Value Date   HGBA1C 6.8* 12/21/2013  Patient has managed this with diet keeping her A1c below 7.0%. Patient denies reactive hypoglycemic symptoms, visual blurring, diabetic polys, or paresthesias.   Finally, patient has history of Vitamin D Deficiency and last Vitamin D was  Lab Results  Component Value Date   VD25OH 4456 08/24/2013   Current Outpatient Prescriptions on File Prior to Visit  Medication Sig  . allopurinol (ZYLOPRIM) 100 MG tablet Take 1 tablet (100 mg total) by mouth daily.  Marland Kitchen. aspirin EC 81 MG EC tablet Take 1 tablet (81 mg total) by mouth daily.  . B Complex Vitamins (VITAMIN-B COMPLEX PO) Take 1 tablet by mouth daily.   . carvedilol (COREG) 6.25 MG tablet Take 1 tablet (6.25 mg total) by mouth 2 (two) times daily with a meal.  . VITAMIN D 2000 UNITS CAPS Take 2,000 Units by mouth daily.  . furosemide  (LASIX) 40 MG tablet Take 0.5 tablets (20 mg total) by mouth daily.  Marland Kitchen. gabapentin  100 MG capsule 1 in the morning and 2-3 at night for pain  .  (NORCO) 5-325 MG per tablet 1/2-1 pill twice daily as needed for pain  . levothyroxine (SYNTHROID, LEVOTHROID) 50 MCG tablet 1/2 tablet daily except take 1 tablet Tues, Thurs  . losartan  25 MG tablet Take 1 tablet (25 mg total) by mouth daily.  Marland Kitchen. MAGNESIUM PO Take 1 tablet by mouth daily.   . Menaquinone-7 (VITAMIN K2 PO) Take 50 mcg by mouth every other day.  . Omega-3 Fatty Acids (FISH OIL PO) Take 1 capsule by mouth daily.   Marland Kitchen. OVER THE COUNTER MEDICATION Take 1 tablet by mouth daily. Tumeric  . OVER THE COUNTER MEDICATION Take 1 tablet by mouth daily. Herbavision-Lutin-Bilberry  . predniSONE (DELTASONE) 20 MG tablet 3 tablets daily with food for 3 days, 2 tabs daily for 3 days, 1 tab a day for 5 days.  . Probiotic Product (PROBIOTIC DAILY PO) Take 1 tablet by mouth daily.   . sertraline (ZOLOFT) 100 MG tablet Take 0.5 tablets (50 mg total) by mouth daily.  . vitamin B-12 (CYANOCOBALAMIN) 100 MCG tablet Take 1,000 mcg by mouth daily.   No current facility-administered medications on file prior to visit.   Allergies  Allergen Reactions  . Ace Inhibitors   . Codeine  dysuria  . Lisinopril Swelling  . Morphine And Related     dysuria  . Percocet [Oxycodone-Acetaminophen]     dysuria  . Vitamin C     dysuria  . Vitamin D Analogs     Large doses cause dysuria   Past Medical History  Diagnosis Date  . Hyperlipidemia   . Hypertension   . Type II or unspecified type diabetes mellitus without mention of complication, not stated as uncontrolled   . Diabetic neuropathy   . Chronic kidney disease   . GERD (gastroesophageal reflux disease)   . Osteoporosis   . CHF (congestive heart failure)   . Cardiomyopathy    Past Surgical History  Procedure Laterality Date  . Nephrectomy Right 1959  . Breast lumpectomy      [phone]  .  Bladder surgery  1984  . Colostomy  2004    Reversed 2005  . Eye surgery  2008 and 2009   Family History  Problem Relation Age of Onset  . Heart disease Father   . Heart disease Sister   . Diabetes Daughter   . Hypertension Son    History  Substance Use Topics  . Smoking status: Never Smoker   . Smokeless tobacco: Not on file  . Alcohol Use: Yes     Comment: Occ wine states she is going to stop    ROS Constitutional: Denies fever, chills, weight loss/gain, headaches, insomnia, fatigue, night sweats, and change in appetite. Eyes: Denies redness, blurred vision, diplopia, discharge, itchy, watery eyes.  ENT: Denies discharge, congestion, post nasal drip, epistaxis, sore throat, earache, hearing loss, dental pain, Tinnitus, Vertigo, Sinus pain, snoring.  Cardio: Denies chest pain, palpitations, irregular heartbeat, syncope, dyspnea, diaphoresis, orthopnea, PND, claudication, edema Respiratory: denies cough, dyspnea, DOE, pleurisy, hoarseness, laryngitis, wheezing.  Gastrointestinal: Denies dysphagia, heartburn, reflux, water brash, pain, cramps, nausea, vomiting, bloating, diarrhea, constipation, hematemesis, melena, hematochezia, jaundice, hemorrhoids Genitourinary: Denies dysuria, frequency, urgency, nocturia, hesitancy, discharge, hematuria, flank pain Breast: Breast lumps, nipple discharge, bleeding.  Musculoskeletal: Denies arthralgia, myalgia, stiffness, Jt. Swelling, pain, limp, and strain/sprain. Denies falls. Skin: Denies puritis, rash, hives, warts, acne, eczema, changing in skin lesion Neuro: No weakness, tremor, incoordination, spasms, paresthesia, pain Psychiatric: Denies confusion, memory loss, sensory loss. Denies Depression. Endocrine: Denies change in weight, skin, hair change, nocturia, and paresthesia, diabetic polys, visual blurring, hyper / hypo glycemic episodes.  Heme/Lymph: No excessive bleeding, bruising, enlarged lymph nodes.  Physical Exam  BP 136/62   Pulse 52  Temp(Src) 98.2 F (36.8 C) (Temporal)  Resp 16  Ht 5\' 3"  (1.6 m)  Wt 138 lb (62.596 kg)  BMI 24.45 kg/m2  General Appearance: Well nourished and in no apparent distress. Eyes: PERRLA, EOMs, conjunctiva no swelling or erythema, normal fundi and vessels. Sinuses: No frontal/maxillary tenderness ENT/Mouth: EACs patent / TMs  nl. Nares clear without erythema, swelling, mucoid exudates. Oral hygiene is good. No erythema, swelling, or exudate. Tongue normal, non-obstructing. Tonsils not swollen or erythematous. Hearing normal.  Neck: Supple, thyroid normal. No bruits, nodes or JVD. Respiratory: Respiratory effort normal.  BS equal and clear bilateral without rales, rhonci, wheezing or stridor. Cardio: Heart sounds are normal with regular rate and rhythm and no murmurs, rubs or gallops. Peripheral pulses are normal and equal bilaterally without edema. No aortic or femoral bruits. Chest: symmetric with normal excursions and percussion. Breasts: Symmetric, without lumps, nipple discharge, retractions, or fibrocystic changes.  Abdomen: Flat, soft, with bowl sounds. Nontender, no guarding, rebound, hernias, masses, or organomegaly.  Lymphatics: Non tender without lymphadenopathy.  Genitourinary:  Musculoskeletal: Full ROM all peripheral extremities, joint stability, 5/5 strength, and normal gait. Skin: Warm and dry without rashes, lesions, cyanosis, clubbing or  ecchymosis.  Neuro: Cranial nerves intact, reflexes equal bilaterally. Normal muscle tone, no cerebellar symptoms. Sensation intact.  Pysch: Awake and oriented X 3, normal affect, Insight and Judgment appropriate.   Assessment and Plan  1. Annual Screening Examination 2. Hypertension  3. Hyperlipidemia 4. T2_NIDDM 5. Vitamin D Deficiency 6. ASCAD 7. Query Leg Discomfort - Recc restart Gabapentin at higher dose of 300 mg qam and 300 mg - 1 or 2 qhs.  Continue prudent diet as discussed, weight control, BP monitoring, regular  exercise, and medications. Discussed med's effects and SE's. Screening labs and tests as requested with regular follow-up as recommended.

## 2014-02-28 NOTE — Patient Instructions (Signed)
Diabetes and Exercise Exercising regularly is important. It is not just about losing weight. It has many health benefits, such as:  Improving your overall fitness, flexibility, and endurance.  Increasing your bone density.  Helping with weight control.  Decreasing your body fat.  Increasing your muscle strength.  Reducing stress and tension.  Improving your overall health. People with diabetes who exercise gain additional benefits because exercise:  Reduces appetite.  Improves the body's use of blood sugar (glucose).  Helps lower or control blood glucose.  Decreases blood pressure.  Helps control blood lipids (such as cholesterol and triglycerides).  Improves the body's use of the hormone insulin by:  Increasing the body's insulin sensitivity.  Reducing the body's insulin needs.  Decreases the risk for heart disease because exercising:  Lowers cholesterol and triglycerides levels.  Increases the levels of good cholesterol (such as high-density lipoproteins [HDL]) in the body.  Lowers blood glucose levels. YOUR ACTIVITY PLAN  Choose an activity that you enjoy and set realistic goals. Your health care provider or diabetes educator can help you make an activity plan that works for you. Exercise regularly as directed by your health care provider. This includes:  Performing resistance training twice a week such as push-ups, sit-ups, lifting weights, or using resistance bands.  Performing 150 minutes of cardio exercises each week such as walking, running, or playing sports.  Staying active and spending no more than 90 minutes at one time being inactive. Even short bursts of exercise are good for you. Three 10-minute sessions spread throughout the day are just as beneficial as a single 30-minute session. Some exercise ideas include:  Taking the dog for a walk.  Taking the stairs instead of the elevator.  Dancing to your favorite song.  Doing an exercise  video.  Doing your favorite exercise with a friend. RECOMMENDATIONS FOR EXERCISING WITH TYPE 1 OR TYPE 2 DIABETES   Check your blood glucose before exercising. If blood glucose levels are greater than 240 mg/dL, check for urine ketones. Do not exercise if ketones are present.  Avoid injecting insulin into areas of the body that are going to be exercised. For example, avoid injecting insulin into:  The arms when playing tennis.  The legs when jogging.  Keep a record of:  Food intake before and after you exercise.  Expected peak times of insulin action.  Blood glucose levels before and after you exercise.  The type and amount of exercise you have done.  Review your records with your health care provider. Your health care provider will help you to develop guidelines for adjusting food intake and insulin amounts before and after exercising.  If you take insulin or oral hypoglycemic agents, watch for signs and symptoms of hypoglycemia. They include:  Dizziness.  Shaking.  Sweating.  Chills.  Confusion.  Drink plenty of water while you exercise to prevent dehydration or heat stroke. Body water is lost during exercise and must be replaced.  Talk to your health care provider before starting an exercise program to make sure it is safe for you. Remember, almost any type of activity is better than none. Document Released: 10/12/2003 Document Revised: 12/06/2013 Document Reviewed: 12/29/2012 Exit[employer] Patient Information 2015 [employer]. This information is not intended to replace advice given to you by your health care provider. Make sure you discuss any questions you have with your health care provider. Hypertension Hypertension, commonly called high blood pressure, is when the force of blood pumping through your arteries is too strong. Your   arteries are the blood vessels that carry blood from your heart throughout your body. A blood pressure reading consists of a higher number  over a lower number, such as 110/72. The higher number (systolic) is the pressure inside your arteries when your heart pumps. The lower number (diastolic) is the pressure inside your arteries when your heart relaxes. Ideally you want your blood pressure below 120/80. Hypertension forces your heart to work harder to pump blood. Your arteries may become narrow or stiff. Having hypertension puts you at risk for heart disease, stroke, and other problems.  RISK FACTORS Some risk factors for high blood pressure are controllable. Others are not.  Risk factors you cannot control include:   Race. You may be at higher risk if you are African American.  Age. Risk increases with age.  Gender. Men are at higher risk than women before age 45 years. After age 65, women are at higher risk than men. Risk factors you can control include:  Not getting enough exercise or physical activity.  Being overweight.  Getting too much fat, sugar, calories, or salt in your diet.  Drinking too much alcohol. SIGNS AND SYMPTOMS Hypertension does not usually cause signs or symptoms. Extremely high blood pressure (hypertensive crisis) may cause headache, anxiety, shortness of breath, and nosebleed. DIAGNOSIS  To check if you have hypertension, your health care provider will measure your blood pressure while you are seated, with your arm held at the level of your heart. It should be measured at least twice using the same arm. Certain conditions can cause a difference in blood pressure between your right and left arms. A blood pressure reading that is higher than normal on one occasion does not mean that you need treatment. If one blood pressure reading is high, ask your health care provider about having it checked again. TREATMENT  Treating high blood pressure includes making lifestyle changes and possibly taking medicine. Living a healthy lifestyle can help lower high blood pressure. You may need to change some of your  habits. Lifestyle changes may include:  Following the DASH diet. This diet is high in fruits, vegetables, and whole grains. It is low in salt, red meat, and added sugars.  Getting at least 2 hours of brisk physical activity every week.  Losing weight if necessary.  Not smoking.  Limiting alcoholic beverages.  Learning ways to reduce stress. If lifestyle changes are not enough to get your blood pressure under control, your health care provider may prescribe medicine. You may need to take more than one. Work closely with your health care provider to understand the risks and benefits. HOME CARE INSTRUCTIONS  Have your blood pressure rechecked as directed by your health care provider.   Take medicines only as directed by your health care provider. Follow the directions carefully. Blood pressure medicines must be taken as prescribed. The medicine does not work as well when you skip doses. Skipping doses also puts you at risk for problems.   Do not smoke.   Monitor your blood pressure at home as directed by your health care provider. SEEK MEDICAL CARE IF:   You think you are having a reaction to medicines taken.  You have recurrent headaches or feel dizzy.  You have swelling in your ankles.  You have trouble with your vision. SEEK IMMEDIATE MEDICAL CARE IF:  You develop a severe headache or confusion.  You have unusual weakness, numbness, or feel faint.  You have severe chest or abdominal pain.  You vomit   repeatedly.  You have trouble breathing. MAKE SURE YOU:   Understand these instructions.  Will watch your condition.  Will get help right away if you are not doing well or get worse. Document Released: 07/22/2005 Document Revised: 12/06/2013 Document Reviewed: 05/14/2013 Exit[employer] Patient Information 2015 [employer]. This information is not intended to replace advice given to you by your health care provider. Make sure you discuss any questions you have with  your health care provider.  

## 2014-03-01 LAB — HEPATIC FUNCTION PANEL
ALK PHOS: 47 U/L (ref 39–117)
ALT: 8 U/L (ref 0–35)
AST: 17 U/L (ref 0–37)
Albumin: 3.7 g/dL (ref 3.5–5.2)
BILIRUBIN DIRECT: 0.2 mg/dL (ref 0.0–0.3)
BILIRUBIN INDIRECT: 0.6 mg/dL (ref 0.2–1.2)
BILIRUBIN TOTAL: 0.8 mg/dL (ref 0.2–1.2)
Total Protein: 6.4 g/dL (ref 6.0–8.3)

## 2014-03-01 LAB — BASIC METABOLIC PANEL WITH GFR
BUN: 39 mg/dL — ABNORMAL HIGH (ref 6–23)
CHLORIDE: 101 meq/L (ref 96–112)
CO2: 30 meq/L (ref 19–32)
Calcium: 9.2 mg/dL (ref 8.4–10.5)
Creat: 1.45 mg/dL — ABNORMAL HIGH (ref 0.50–1.10)
GFR, Est African American: 37 mL/min — ABNORMAL LOW
GFR, Est Non African American: 32 mL/min — ABNORMAL LOW
GLUCOSE: 116 mg/dL — AB (ref 70–99)
POTASSIUM: 5.1 meq/L (ref 3.5–5.3)
SODIUM: 142 meq/L (ref 135–145)

## 2014-03-01 LAB — TSH: TSH: 4.132 u[IU]/mL (ref 0.350–4.500)

## 2014-03-01 LAB — MICROALBUMIN / CREATININE URINE RATIO
Creatinine, Urine: 26 mg/dL
MICROALB UR: 0.55 mg/dL (ref 0.00–1.89)
Microalb Creat Ratio: 21.2 mg/g (ref 0.0–30.0)

## 2014-03-01 LAB — URINALYSIS, MICROSCOPIC ONLY
CASTS: NONE SEEN
CRYSTALS: NONE SEEN

## 2014-03-01 LAB — LIPID PANEL
CHOL/HDL RATIO: 3.3 ratio
Cholesterol: 206 mg/dL — ABNORMAL HIGH (ref 0–200)
HDL: 62 mg/dL (ref >39)
LDL Cholesterol: 125 mg/dL — ABNORMAL HIGH (ref 0–99)
Triglycerides: 97 mg/dL (ref <150)
VLDL: 19 mg/dL (ref 0–40)

## 2014-03-01 LAB — VITAMIN D 25 HYDROXY (VIT D DEFICIENCY, FRACTURES): Vit D, 25-Hydroxy: 59 ng/mL (ref 30–89)

## 2014-03-01 LAB — MAGNESIUM: Magnesium: 2.5 mg/dL (ref 1.5–2.5)

## 2014-03-01 LAB — INSULIN, FASTING: INSULIN FASTING, SERUM: 8 u[IU]/mL (ref 3–28)

## 2014-03-01 LAB — URIC ACID: Uric Acid, Serum: 5.6 mg/dL (ref 2.4–7.0)

## 2014-03-16 ENCOUNTER — Other Ambulatory Visit: Payer: Self-pay

## 2014-03-16 DIAGNOSIS — I5041 Acute combined systolic (congestive) and diastolic (congestive) heart failure: Secondary | ICD-10-CM

## 2014-03-16 MED ORDER — LOSARTAN POTASSIUM 25 MG PO TABS: 25.0000 mg | ORAL_TABLET | Freq: Every day | ORAL | Status: DC

## 2014-03-18 ENCOUNTER — Encounter: Payer: Self-pay | Admitting: Physician Assistant

## 2014-04-04 ENCOUNTER — Other Ambulatory Visit: Payer: Self-pay | Admitting: Physician Assistant

## 2014-05-10 ENCOUNTER — Ambulatory Visit: Payer: Medicare Other | Admitting: Cardiology

## 2014-05-16 ENCOUNTER — Other Ambulatory Visit: Payer: Self-pay | Admitting: Internal Medicine

## 2014-05-17 ENCOUNTER — Ambulatory Visit (INDEPENDENT_AMBULATORY_CARE_PROVIDER_SITE_OTHER): Payer: Medicare Other | Admitting: Podiatry

## 2014-05-17 ENCOUNTER — Encounter: Payer: Self-pay | Admitting: Podiatry

## 2014-05-17 DIAGNOSIS — M79676 Pain in unspecified toe(s): Secondary | ICD-10-CM

## 2014-05-17 DIAGNOSIS — B351 Tinea unguium: Secondary | ICD-10-CM

## 2014-05-17 NOTE — Progress Notes (Signed)
Presents today chief complaint of painful elongated toenails.  Objective: Pulses are palpable bilateral nails are thick, yellow dystrophic onychomycosis and painful palpation.   Assessment: Onychomycosis with pain in limb.  Plan: Treatment of nails in thickness and length as covered service secondary to pain.  

## 2014-05-18 ENCOUNTER — Other Ambulatory Visit: Payer: Self-pay | Admitting: *Deleted

## 2014-05-18 MED ORDER — CARVEDILOL 6.25 MG PO TABS
6.2500 mg | ORAL_TABLET | Freq: Two times a day (BID) | ORAL | Status: DC
Start: 2014-05-18 — End: 2014-11-07

## 2014-05-20 ENCOUNTER — Other Ambulatory Visit: Payer: Self-pay

## 2014-05-24 ENCOUNTER — Encounter: Payer: Self-pay | Admitting: Cardiology

## 2014-05-24 ENCOUNTER — Ambulatory Visit: Payer: Medicare Other | Admitting: Podiatry

## 2014-05-24 ENCOUNTER — Ambulatory Visit (INDEPENDENT_AMBULATORY_CARE_PROVIDER_SITE_OTHER): Payer: Medicare Other | Admitting: Cardiology

## 2014-05-24 VITALS — BP 153/63 | HR 51 | Ht 62.0 in | Wt 148.5 lb

## 2014-05-24 DIAGNOSIS — I5022 Chronic systolic (congestive) heart failure: Secondary | ICD-10-CM

## 2014-05-24 DIAGNOSIS — I429 Cardiomyopathy, unspecified: Secondary | ICD-10-CM

## 2014-05-24 DIAGNOSIS — I10 Essential (primary) hypertension: Secondary | ICD-10-CM

## 2014-05-24 LAB — BASIC METABOLIC PANEL WITH GFR
BUN: 42 mg/dL — AB (ref 6–23)
CHLORIDE: 102 meq/L (ref 96–112)
CO2: 31 meq/L (ref 19–32)
Calcium: 9 mg/dL (ref 8.4–10.5)
Creat: 1.47 mg/dL — ABNORMAL HIGH (ref 0.50–1.10)
GFR, Est African American: 36 mL/min — ABNORMAL LOW
GFR, Est Non African American: 31 mL/min — ABNORMAL LOW
Glucose, Bld: 103 mg/dL — ABNORMAL HIGH (ref 70–99)
Potassium: 4.6 mEq/L (ref 3.5–5.3)
Sodium: 140 mEq/L (ref 135–145)

## 2014-05-24 NOTE — Assessment & Plan Note (Signed)
Patient has a cardiomyopathy. She does not want further cardiac testing but prefers only medical therapy which is appropriate. Continue ARB and beta blocker. Continue present dose of Lasix. Check potassium and renal function.

## 2014-05-24 NOTE — Assessment & Plan Note (Signed)
Blood pressure is minimally elevated. I am hesitant to increase her ARB given renal insufficiency. We will continue with present medications at this point.

## 2014-05-24 NOTE — Progress Notes (Signed)
HPI: FU CHF. Admitted with CHF and pneumonia in April 2015. Echocardiogram 4/15 revealed severe systolic dysfunction EF 25-30% biatrial enlargement and moderate mitral regurgitation and severely elevated pulmonary pressures. Small pericardial effusion. She was treated with IV Lasix and renal function was followed closely given history of nephrectomy and baseline renal insufficiency. Cozaar was started but then stopped because of increased creatinine. She did not want aggressive procedures including cardiac catheterization. Medical therapy was recommended. Venous dopplers 5/15 showed no DVT. Since she was last seen, She has some dyspnea on exertion with more prolonged activities. No orthopnea, PND, chest pain or syncope. Minimal pedal edema.   Current Outpatient Prescriptions  Medication Sig Dispense Refill  . allopurinol (ZYLOPRIM) 100 MG tablet TAKE 1 TABLET (100 MG TOTAL) BY MOUTH DAILY.  30 tablet  2  . aspirin EC 81 MG EC tablet Take 1 tablet (81 mg total) by mouth daily.      . B Complex Vitamins (VITAMIN-B COMPLEX PO) Take 1 tablet by mouth daily.       . carvedilol (COREG) 6.25 MG tablet Take 1 tablet (6.25 mg total) by mouth 2 (two) times daily with a meal.  60 tablet  5  . Cholecalciferol (VITAMIN D) 2000 UNITS CAPS Take 2,000 Units by mouth daily.      . furosemide (LASIX) 40 MG tablet Take 40 mg by mouth daily.      Marland Kitchen. gabapentin (NEURONTIN) 300 MG capsule TAKE 1 CAPSULE BY MOUTH IN AM AND 1 TO 2 CAPSULES AT BEDTIME FOR LEG PAINS  90 capsule  1  . HYDROcodone-acetaminophen (NORCO/VICODIN) 5-325 MG per tablet 1/2-1 tabs in PM as needed for pain      . levothyroxine (SYNTHROID, LEVOTHROID) 50 MCG tablet 1/2 tablet daily except take 1 tablet Tues, Thurs  90 tablet  2  . losartan (COZAAR) 25 MG tablet Take 1 tablet (25 mg total) by mouth daily.  30 tablet  3  . MAGNESIUM PO Take 1 tablet by mouth daily.       . Menaquinone-7 (VITAMIN K2 PO) Take 50 mcg by mouth every other day.        . Omega-3 Fatty Acids (FISH OIL PO) Take 1 capsule by mouth daily.       Marland Kitchen. OVER THE COUNTER MEDICATION Take 2 tablets by mouth daily. Tumeric      . Probiotic Product (PROBIOTIC DAILY PO) Take 1 tablet by mouth daily.       . sertraline (ZOLOFT) 100 MG tablet Take 0.5 tablets (50 mg total) by mouth daily.  90 tablet  1  . vitamin B-12 (CYANOCOBALAMIN) 100 MCG tablet Take 1,000 mcg by mouth daily.       No current facility-administered medications for this visit.     Past Medical History  Diagnosis Date  . Hyperlipidemia   . Hypertension   . Type II or unspecified type diabetes mellitus without mention of complication, not stated as uncontrolled   . Diabetic neuropathy   . Chronic kidney disease   . GERD (gastroesophageal reflux disease)   . Osteoporosis   . CHF (congestive heart failure)   . Cardiomyopathy     Past Surgical History  Procedure Laterality Date  . Nephrectomy Right 1959  . Breast lumpectomy      [phone],[id number],2005  . Bladder surgery  1984  . Colostomy  2004    Reversed 2005  . Eye surgery  2008 and 2009    History   Social History  .  Marital Status: Widowed    Spouse Name: N/A    Number of Children: 2  . Years of Education: N/A   Occupational History  . Not on file.   Social History Main Topics  . Smoking status: Never Smoker   . Smokeless tobacco: Not on file  . Alcohol Use: Yes     Comment: Occ wine states she is going to stop  . Drug Use: No  . Sexual Activity: No   Other Topics Concern  . Not on file   Social History Narrative  . No narrative on file    ROS: no fevers or chills, productive cough, hemoptysis, dysphasia, odynophagia, melena, hematochezia, dysuria, hematuria, rash, seizure activity, orthopnea, PND, pedal edema, claudication. Remaining systems are negative.  Physical Exam: Well-developed well-nourished in no acute distress.  Skin is warm and dry.  HEENT is normal.  Neck is supple.  Chest is clear to auscultation with  normal expansion.  Cardiovascular exam is regular rate and rhythm.  Abdominal exam nontender or distended. No masses palpated. Extremities show trace edema. neuro grossly intact

## 2014-05-24 NOTE — Assessment & Plan Note (Signed)
Follow renal function closely. 

## 2014-05-24 NOTE — Assessment & Plan Note (Signed)
Patient is euvolemic on examination. We will continue present dose of Lasix. Check potassium and renal function.

## 2014-05-24 NOTE — Patient Instructions (Signed)
Your physician wants you to follow-up in: 6 MONTHS WITH DR CRENSHAW You will receive a reminder letter in the mail two months in advance. If you don't receive a letter, please call our office to schedule the follow-up appointment.   Your physician recommends that you HAVE LAB WORK TODAY 

## 2014-06-10 ENCOUNTER — Encounter: Payer: Self-pay | Admitting: Physician Assistant

## 2014-06-10 ENCOUNTER — Ambulatory Visit (INDEPENDENT_AMBULATORY_CARE_PROVIDER_SITE_OTHER): Payer: Medicare Other | Admitting: Physician Assistant

## 2014-06-10 VITALS — BP 158/62 | HR 56 | Temp 98.6°F | Resp 16 | Ht 63.0 in | Wt 153.0 lb

## 2014-06-10 DIAGNOSIS — E559 Vitamin D deficiency, unspecified: Secondary | ICD-10-CM

## 2014-06-10 DIAGNOSIS — E785 Hyperlipidemia, unspecified: Secondary | ICD-10-CM

## 2014-06-10 DIAGNOSIS — K2100 Gastro-esophageal reflux disease with esophagitis, without bleeding: Secondary | ICD-10-CM

## 2014-06-10 DIAGNOSIS — R296 Repeated falls: Secondary | ICD-10-CM

## 2014-06-10 DIAGNOSIS — Z9181 History of falling: Secondary | ICD-10-CM

## 2014-06-10 DIAGNOSIS — E1142 Type 2 diabetes mellitus with diabetic polyneuropathy: Secondary | ICD-10-CM

## 2014-06-10 DIAGNOSIS — Z1331 Encounter for screening for depression: Secondary | ICD-10-CM

## 2014-06-10 DIAGNOSIS — I5022 Chronic systolic (congestive) heart failure: Secondary | ICD-10-CM

## 2014-06-10 DIAGNOSIS — E1122 Type 2 diabetes mellitus with diabetic chronic kidney disease: Secondary | ICD-10-CM

## 2014-06-10 DIAGNOSIS — M810 Age-related osteoporosis without current pathological fracture: Secondary | ICD-10-CM

## 2014-06-10 DIAGNOSIS — Z79899 Other long term (current) drug therapy: Secondary | ICD-10-CM

## 2014-06-10 DIAGNOSIS — F0390 Unspecified dementia without behavioral disturbance: Secondary | ICD-10-CM

## 2014-06-10 DIAGNOSIS — R6889 Other general symptoms and signs: Secondary | ICD-10-CM

## 2014-06-10 DIAGNOSIS — N183 Chronic kidney disease, stage 3 unspecified: Secondary | ICD-10-CM

## 2014-06-10 DIAGNOSIS — N3000 Acute cystitis without hematuria: Secondary | ICD-10-CM

## 2014-06-10 DIAGNOSIS — Z0001 Encounter for general adult medical examination with abnormal findings: Secondary | ICD-10-CM

## 2014-06-10 DIAGNOSIS — I10 Essential (primary) hypertension: Secondary | ICD-10-CM

## 2014-06-10 LAB — HEPATIC FUNCTION PANEL
ALBUMIN: 3.8 g/dL (ref 3.5–5.2)
ALT: 9 U/L (ref 0–35)
AST: 16 U/L (ref 0–37)
Alkaline Phosphatase: 60 U/L (ref 39–117)
Bilirubin, Direct: 0.1 mg/dL (ref 0.0–0.3)
Indirect Bilirubin: 0.5 mg/dL (ref 0.2–1.2)
Total Bilirubin: 0.6 mg/dL (ref 0.2–1.2)
Total Protein: 6.5 g/dL (ref 6.0–8.3)

## 2014-06-10 LAB — CBC WITH DIFFERENTIAL/PLATELET
BASOS ABS: 0 10*3/uL (ref 0.0–0.1)
BASOS PCT: 0 % (ref 0–1)
EOS PCT: 3 % (ref 0–5)
Eosinophils Absolute: 0.2 10*3/uL (ref 0.0–0.7)
HEMATOCRIT: 38.7 % (ref 36.0–46.0)
Hemoglobin: 12.7 g/dL (ref 12.0–15.0)
LYMPHS PCT: 21 % (ref 12–46)
Lymphs Abs: 1.2 10*3/uL (ref 0.7–4.0)
MCH: 32.6 pg (ref 26.0–34.0)
MCHC: 32.8 g/dL (ref 30.0–36.0)
MCV: 99.2 fL (ref 78.0–100.0)
MONOS PCT: 5 % (ref 3–12)
Monocytes Absolute: 0.3 10*3/uL (ref 0.1–1.0)
Neutro Abs: 4 10*3/uL (ref 1.7–7.7)
Neutrophils Relative %: 71 % (ref 43–77)
Platelets: 217 10*3/uL (ref 150–400)
RBC: 3.9 MIL/uL (ref 3.87–5.11)
RDW: 14.2 % (ref 11.5–15.5)
WBC: 5.7 10*3/uL (ref 4.0–10.5)

## 2014-06-10 LAB — LIPID PANEL
Cholesterol: 209 mg/dL — ABNORMAL HIGH (ref 0–200)
HDL: 68 mg/dL (ref >39)
LDL Cholesterol: 124 mg/dL — ABNORMAL HIGH (ref 0–99)
Total CHOL/HDL Ratio: 3.1 Ratio
Triglycerides: 83 mg/dL (ref <150)
VLDL: 17 mg/dL (ref 0–40)

## 2014-06-10 LAB — BASIC METABOLIC PANEL WITH GFR
BUN: 38 mg/dL — AB (ref 6–23)
CHLORIDE: 101 meq/L (ref 96–112)
CO2: 28 mEq/L (ref 19–32)
CREATININE: 1.55 mg/dL — AB (ref 0.50–1.10)
Calcium: 8.6 mg/dL (ref 8.4–10.5)
GFR, EST NON AFRICAN AMERICAN: 29 mL/min — AB
GFR, Est African American: 34 mL/min — ABNORMAL LOW
Glucose, Bld: 111 mg/dL — ABNORMAL HIGH (ref 70–99)
Potassium: 4.6 mEq/L (ref 3.5–5.3)
Sodium: 142 mEq/L (ref 135–145)

## 2014-06-10 LAB — HEMOGLOBIN A1C
HEMOGLOBIN A1C: 6.6 % — AB (ref <5.7)
MEAN PLASMA GLUCOSE: 143 mg/dL — AB (ref <117)

## 2014-06-10 LAB — TSH: TSH: 2.431 u[IU]/mL (ref 0.350–4.500)

## 2014-06-10 LAB — MAGNESIUM: Magnesium: 2.8 mg/dL — ABNORMAL HIGH (ref 1.5–2.5)

## 2014-06-10 NOTE — Progress Notes (Signed)
MEDICARE ANNUAL WELLNESS VISIT AND FOLLOW UP  Assessment:   1. Essential hypertension - CBC with Differential - BASIC METABOLIC PANEL WITH GFR - Hepatic function panel - TSH  2. Chronic systolic congestive heart failure Continue cardio follow up  3. Gastroesophageal reflux disease with esophagitis monitor  4. CKD stage 3 due to type 2 diabetes mellitus Discussed general issues about diabetes pathophysiology and management., Educational material distributed., Suggested low cholesterol diet., Encouraged aerobic exercise., Discussed foot care., Reminded to get yearly retinal exam. - Hemoglobin A1c - HM DIABETES FOOT EXAM  5. Diabetic polyneuropathy associated with type 2 diabetes mellitus Discussed general issues about diabetes pathophysiology and management., Educational material distributed., Suggested low cholesterol diet., Encouraged aerobic exercise., Discussed foot care., Reminded to get yearly retinal exam.  6. Osteoporosis monitor  7. Chronic kidney disease, stage 3 (moderate) - BASIC METABOLIC PANEL WITH GFR  8. Hyperlipidemia - Lipid panel  9. Encounter for long-term (current) use of medications - Magnesium  10. Vitamin D deficiency  11. Dementia, without behavioral disturbance Check labs, likely age related, ? Need to cut back on coreg due to heart rate.   12. Acute cystitis without hematuria - Urinalysis, Routine w reflex microscopic - Urine culture  Patient and daughter do not want any extordinary life measure taken, she is DNR, and states she does not want to go back to the hospital.  OVER 40 minutes of exam, counseling, chart review  Plan:   During the course of the visit the patient was educated and counseled about appropriate screening and preventive services including:    Pneumococcal vaccine   Influenza vaccine  Td vaccine  Screening electrocardiogram  Screening mammography  Bone densitometry screening  Colorectal cancer  screening  Diabetes screening  Glaucoma screening  Nutrition counseling   Advanced directives: given info/requested  Screening recommendations, referrals:  Vaccinations: Please see documentation below and orders this visit.   Nutrition assessed and recommended  Colonoscopy declined Mammogram declined Pap smear not indicated Pelvic exam not indicated Recommended yearly ophthalmology/optometry visit for glaucoma screening and checkup Recommended yearly dental visit for hygiene and checkup Advanced directives - requested  Conditions/risks identified: BMI: Discussed weight loss, diet, and increase physical activity.  Increase physical activity: AHA recommends 150 minutes of physical activity a week.  Medications reviewed DEXA- declined Diabetes is not at goal, ACE/ARB therapy: Yes. Urinary Incontinence is an issue: discussed non pharmacology and pharmacology options.  Fall risk: high- discussed PT, home fall assessment, medications.    Subjective:   [name] is a 80 y.o. female who presents for Medicare Annual Wellness Visit and 3 month follow up on hypertension, diabetes, hyperlipidemia, vitamin D def.  Date of last medicare wellness visit is unknown.   Her blood pressure has been controlled at home, today their BP is BP: (!) 158/62 mmHg  BP Readings from Last 3 Encounters:  06/10/14 158/62  05/24/14 153/63  02/28/14 136/62   She does not workout. She denies chest pain, shortness of breath, dizziness.  She is not on cholesterol medication and denies myalgias. Her cholesterol is at goal. The cholesterol last visit was:   Lab Results  Component Value Date   CHOL 206* 02/28/2014   HDL 62 02/28/2014   LDLCALC 125* 02/28/2014   TRIG 97 02/28/2014   CHOLHDL 3.3 02/28/2014   She has not been working on diet and exercise for diabetes, and denies paresthesia of the feet, polydipsia, polyuria and visual disturbances. Last A1C in the office was:  Lab Results  Component Value Date   HGBA1C 6.8* 12/21/2013   Patient is on Vitamin D supplement. Lab Results  Component Value Date   VD25OH 65 02/28/2014     She has a very supportive family, her daughter is here and is concerned that her memory has been getting worse over the past 3-6 months. She has forgotten her last name, not getting undressed for the shower. And has been in a "stupor".  In March she was found to have DCMP EF 30-35% complicated by pneumonia, she has been following with Dr. Jens Som and her weight is followed daily at home and she is controlled on her medications. Her weight is up but her daughter states this is due to poor eating habits. H Wt Readings from Last 3 Encounters:  06/10/14 153 lb (69.4 kg)  05/24/14 148 lb 8 oz (67.359 kg)  02/28/14 138 lb (62.596 kg)   She does not tolerate NSAIDS due to previous nephrectomy.  She has chronic pain and is on allopurinol for gout, Neurontin 300 mg BID increased 4-6 months ago, and she is off the Norco.  She is on thyroid medication. Her medication was not changed last visit. She is on 1 pill T,T and 1/2 pill the rest of the days Lab Results  Component Value Date   TSH 4.132 02/28/2014  .  She would like to be DNR, she has severe CHF with very limited mobility due to shortness of breath.   Names of Other Physician/Practitioners you currently use: 1. [facility]- here for primary care Patient Care Team: Lucky Cowboy, MD as PCP - General (Internal Medicine)  Medication Review Current Outpatient Prescriptions on File Prior to Visit  Medication Sig Dispense Refill  . allopurinol (ZYLOPRIM) 100 MG tablet TAKE 1 TABLET (100 MG TOTAL) BY MOUTH DAILY. 30 tablet 2  . aspirin EC 81 MG EC tablet Take 1 tablet (81 mg total) by mouth daily.    . B Complex Vitamins (VITAMIN-B COMPLEX PO) Take 1 tablet by mouth daily.     . carvedilol (COREG) 6.25 MG tablet Take 1 tablet (6.25 mg total) by mouth 2 (two)  times daily with a meal. 60 tablet 5  . Cholecalciferol (VITAMIN D) 2000 UNITS CAPS Take 2,000 Units by mouth daily.    . furosemide (LASIX) 40 MG tablet Take 40 mg by mouth daily.    Marland Kitchen gabapentin (NEURONTIN) 300 MG capsule TAKE 1 CAPSULE BY MOUTH IN AM AND 1 TO 2 CAPSULES AT BEDTIME FOR LEG PAINS 90 capsule 1  . HYDROcodone-acetaminophen (NORCO/VICODIN) 5-325 MG per tablet 1/2-1 tabs in PM as needed for pain    . levothyroxine (SYNTHROID, LEVOTHROID) 50 MCG tablet 1/2 tablet daily except take 1 tablet Tues, Thurs 90 tablet 2  . losartan (COZAAR) 25 MG tablet Take 1 tablet (25 mg total) by mouth daily. 30 tablet 3  . MAGNESIUM PO Take 1 tablet by mouth daily.     . Menaquinone-7 (VITAMIN K2 PO) Take 50 mcg by mouth every other day.    . Omega-3 Fatty Acids (FISH OIL PO) Take 1 capsule by mouth daily.     Marland Kitchen OVER THE COUNTER MEDICATION Take 2 tablets by mouth daily. Tumeric    . Probiotic Product (PROBIOTIC DAILY PO) Take 1 tablet by mouth daily.     . sertraline (ZOLOFT) 100 MG tablet Take 0.5 tablets (50 mg total) by mouth daily. 90 tablet 1   No current facility-administered medications on file prior to visit.  Current Problems (verified) Patient Active Problem List   Diagnosis Date Noted  . Cardiomyopathy 05/24/2014  . Vitamin D deficiency 02/27/2014  . Encounter for long-term (current) use of medications 12/21/2013  . CHF (congestive heart failure) 11/03/2013  . Acute on chronic renal failure 11/03/2013  . Acute combined systolic and diastolic CHF, NYHA class 3 11/03/2013  . Hyperlipidemia   . Hypertension   . CKD stage 3 due to type 2 diabetes mellitus   . Diabetic neuropathy   . Chronic kidney disease   . GERD (gastroesophageal reflux disease)   . Osteoporosis     Screening Tests Health Maintenance  Topic Date Due  . FOOT EXAM  11/25/1933  . OPHTHALMOLOGY EXAM  11/25/1933  . COLONOSCOPY  11/25/1973  . ZOSTAVAX  11/26/1983  . INFLUENZA VACCINE  03/05/2014  .  HEMOGLOBIN A1C  06/23/2014  . URINE MICROALBUMIN  03/01/2015  . TETANUS/TDAP  02/24/2023  . PNEUMOCOCCAL POLYSACCHARIDE VACCINE AGE 79 AND OVER  Completed     Immunization History  Administered Date(s) Administered  . Influenza Split 05/06/2013  . Pneumococcal Polysaccharide-23 02/23/2013  . Td 02/23/2013    Preventative care: Last colonoscopy: remote, will not have another Last mammogram: 2013 Last pap smear/pelvic exam: remote DEXA:2012 Echo: 11/2013 CXR 11/2013  Prior vaccinations: TD or Tdap: 2014  Influenza: 2015  Pneumococcal: 2014 Shingles/Zostavax: declines  History reviewed: allergies, current medications, past family history, past medical history, past social history, past surgical history and problem list  Risk Factors: Osteoporosis/FallRisk: postmenopausal estrogen deficiency and dietary calcium and/or vitamin D deficiency In the past year have you fallen or had a near fall?:Yes History of fracture in the past year: no  Tobacco History  Substance Use Topics  . Smoking status: Never Smoker   . Smokeless tobacco: Not on file  . Alcohol Use: Yes     Comment: Occ wine states she is going to stop   She does not smoke.  Patient is not a former smoker. Are there smokers in your home (other than you)?  No  Alcohol Current alcohol use: rare  Caffeine Current caffeine use: coffee 1 /day  Exercise Current exercise: none  Nutrition/Diet Current diet: in general, an "unhealthy" diet  Cardiac risk factors: advanced age (older than 4455 for men, 1465 for women), diabetes mellitus, dyslipidemia, hypertension and sedentary lifestyle.  Depression Screen (Note: if answer to either of the following is "Yes", a more complete depression screening is indicated)   Q1: Over the past two weeks, have you felt down, depressed or hopeless? No  Q2: Over the past two weeks, have you felt little interest or pleasure in doing things? No  Have you lost interest or pleasure in  daily life? No  Do you often feel hopeless? No  Do you cry easily over simple problems? No  Activities of Daily Living In your present state of health, do you have any difficulty performing the following activities?:  Driving? Yes Managing money?  Yes Feeding yourself? No Getting from bed to chair? No Climbing a flight of stairs? Yes Preparing food and eating?: Yes Bathing or showering? Yes Getting dressed: No Getting to the toilet? Yes Using the toilet:Yes Moving around from place to place: Yes   Are you sexually active?  No  Do you have more than one partner?  No  Vision Difficulties: Yes  Hearing Difficulties: Yes Do you often ask people to speak up or repeat themselves? Yes Do you experience ringing or noises in your ears? No Do  you have difficulty understanding soft or whispered voices? Yes  Cognition  Do you feel that you have a problem with memory?Yes  Do you often misplace items? Yes  Do you feel safe at home?  Yes  Advanced directives Does patient have a Health Care Power of Attorney? Yes Does patient have a Living Will? Yes   Objective:   Blood pressure 158/62, pulse 56, temperature 98.6 F (37 C), resp. rate 16, height 5\' 3"  (1.6 m), weight 153 lb (69.4 kg). Body mass index is 27.11 kg/(m^2).  General appearance: alert, no distress, WD/WN,  female Cognitive Testing  Alert? Yes  Normal Appearance?Yes  Oriented to person? Yes  Place? Yes   Time? Only the year  Recall of three objects?  Yes  Can perform simple calculations? Yes  Displays appropriate judgment?Yes  Can read the correct time from a watch face?Yes  HEENT: normocephalic, sclerae anicteric, TMs pearly, nares patent, no discharge or erythema, pharynx normal Oral cavity: MMM, no lesions Neck: supple, no lymphadenopathy, no thyromegaly, no masses Heart: RRR, normal S1, S2, no murmurs Lungs: CTA bilaterally, no wheezes, rhonchi, or rales Abdomen: +bs, soft, non tender, non distended, no  masses, no hepatomegaly, no splenomegaly Musculoskeletal: nontender, no swelling, no obvious deformity Extremities: 1-2+ edema, left greater than right, no cyanosis, no clubbing Pulses: 1+ symmetric, upper and lower extremities, normal cap refill Neurological: alert, oriented x 2, CN2-12 intact, strength normal upper extremities and lower extremities, sensation decreased to mid shins without ulcers DTRs 2+ throughout, no cerebellar signs, Gait with walker, antalgic Psychiatric: normal affect, behavior normal, pleasant  Breast: defer Gyn: defer Rectal: defer  Medicare Attestation I have personally reviewed: The patient's medical and social history Their use of alcohol, tobacco or illicit drugs Their current medications and supplements The patient's functional ability including ADLs,fall risks, home safety risks, cognitive, and hearing and visual impairment Diet and physical activities Evidence for depression or mood disorders  The patient's weight, height, BMI, and visual acuity have been recorded in the chart.  I have made referrals, counseling, and provided education to the patient based on review of the above and I have provided the patient with a written personalized care plan for preventive services.     Quentin MullingCollier, Desarie Feild, PA-C   06/10/2014   Mini Mental Status Exam  Score Max Score  "What is the year? Season? Date? Day of the week? Month?" 2015, sept, 26, thursday 1/5  "Where are we now: State? County? Town/city? Hospital? Floor?" 5/5 5/5  Name 3 objects (apple, penny, book) then Ask the patient to name all three of them.  3 3  "I would like you to count backward from 100 by sevens." (93, 86, 79, 72, 65, .) Stop after five answers. Alternative: "Spell WORLD backwards." (D-L-R-O-W) 5 5  Recall 3 things 3 3  Name two simple objects 2 2  "Repeat the phrase: 'No ifs, ands, or buts.'" 1 1  "Take the paper in your right hand, fold it in half, and hand it back." 3 3  "Please read this  and do what it says." (Written instruction is "Close your eyes.") 1 1  "Make up and write a sentence about anything."  ( noun and a verb.) 1 1  Draw the face of a clock 1 1  Total 26 30   Interpretation of the MMSE Method Score Interpretation  Score Interpretation  24-30 No cognitive impairment  18-23 Mild cognitive impairment  0-17 Severe cognitive impairment

## 2014-06-11 ENCOUNTER — Encounter: Payer: Self-pay | Admitting: Physician Assistant

## 2014-06-11 LAB — URINALYSIS, ROUTINE W REFLEX MICROSCOPIC
Bilirubin Urine: NEGATIVE
Glucose, UA: NEGATIVE mg/dL
HGB URINE DIPSTICK: NEGATIVE
Ketones, ur: NEGATIVE mg/dL
LEUKOCYTES UA: NEGATIVE
NITRITE: POSITIVE — AB
PROTEIN: NEGATIVE mg/dL
Specific Gravity, Urine: 1.013 (ref 1.005–1.030)
Urobilinogen, UA: 0.2 mg/dL (ref 0.0–1.0)
pH: 7.5 (ref 5.0–8.0)

## 2014-06-11 LAB — URINALYSIS, MICROSCOPIC ONLY
CRYSTALS: NONE SEEN
Casts: NONE SEEN

## 2014-06-12 LAB — URINE CULTURE: Colony Count: >=100000

## 2014-06-13 ENCOUNTER — Encounter: Payer: Self-pay | Admitting: Physician Assistant

## 2014-06-13 ENCOUNTER — Other Ambulatory Visit: Payer: Self-pay | Admitting: Physician Assistant

## 2014-06-13 MED ORDER — AMPICILLIN 250 MG PO CAPS: 250.0000 mg | ORAL_CAPSULE | Freq: Three times a day (TID) | ORAL | Status: AC

## 2014-06-28 ENCOUNTER — Other Ambulatory Visit: Payer: Self-pay

## 2014-06-28 MED ORDER — ALLOPURINOL 100 MG PO TABS: ORAL_TABLET | ORAL | Status: DC

## 2014-07-08 ENCOUNTER — Other Ambulatory Visit: Payer: Self-pay | Admitting: Internal Medicine

## 2014-07-08 ENCOUNTER — Other Ambulatory Visit: Payer: Medicare Other

## 2014-07-08 LAB — BASIC METABOLIC PANEL WITH GFR
BUN: 45 mg/dL — ABNORMAL HIGH (ref 6–23)
CALCIUM: 8.8 mg/dL (ref 8.4–10.5)
CO2: 32 meq/L (ref 19–32)
Chloride: 99 mEq/L (ref 96–112)
Creat: 1.69 mg/dL — ABNORMAL HIGH (ref 0.50–1.10)
GFR, Est African American: 30 mL/min — ABNORMAL LOW
GFR, Est Non African American: 26 mL/min — ABNORMAL LOW
GLUCOSE: 140 mg/dL — AB (ref 70–99)
Potassium: 4.1 mEq/L (ref 3.5–5.3)
Sodium: 139 mEq/L (ref 135–145)

## 2014-07-10 ENCOUNTER — Other Ambulatory Visit: Payer: Self-pay | Admitting: Physician Assistant

## 2014-07-11 ENCOUNTER — Encounter: Payer: Self-pay | Admitting: Physician Assistant

## 2014-07-12 ENCOUNTER — Other Ambulatory Visit: Payer: Self-pay | Admitting: *Deleted

## 2014-07-12 DIAGNOSIS — I5041 Acute combined systolic (congestive) and diastolic (congestive) heart failure: Secondary | ICD-10-CM

## 2014-07-12 MED ORDER — LOSARTAN POTASSIUM 25 MG PO TABS: 25.0000 mg | ORAL_TABLET | Freq: Every day | ORAL | Status: DC

## 2014-08-11 ENCOUNTER — Other Ambulatory Visit: Payer: Medicare Other

## 2014-08-18 ENCOUNTER — Other Ambulatory Visit: Payer: Medicare Other

## 2014-08-25 ENCOUNTER — Encounter: Payer: Self-pay | Admitting: Podiatrist

## 2014-08-25 ENCOUNTER — Ambulatory Visit (INDEPENDENT_AMBULATORY_CARE_PROVIDER_SITE_OTHER): Payer: Medicare Other | Admitting: Podiatrist

## 2014-08-25 DIAGNOSIS — Q828 Other specified congenital malformations of skin: Secondary | ICD-10-CM

## 2014-08-25 DIAGNOSIS — E1151 Type 2 diabetes mellitus with diabetic peripheral angiopathy without gangrene: Secondary | ICD-10-CM

## 2014-08-25 DIAGNOSIS — M79676 Pain in unspecified toe(s): Secondary | ICD-10-CM

## 2014-08-25 DIAGNOSIS — B351 Tinea unguium: Secondary | ICD-10-CM

## 2014-08-25 NOTE — Patient Instructions (Signed)
Diabetes and Foot Care Diabetes may cause you to have problems because of poor blood supply (circulation) to your feet and legs. This may cause the skin on your feet to become thinner, break easier, and heal more slowly. Your skin may become dry, and the skin may peel and crack. You may also have nerve damage in your legs and feet causing decreased feeling in them. You may not notice minor injuries to your feet that could lead to infections or more serious problems. Taking care of your feet is one of the most important things you can do for yourself.  HOME CARE INSTRUCTIONS  Wear shoes at all times, even in the house. Do not go barefoot. Bare feet are easily injured.  Check your feet daily for blisters, cuts, and redness. If you cannot see the bottom of your feet, use a mirror or ask someone for help.  Wash your feet with warm water (do not use hot water) and mild soap. Then pat your feet and the areas between your toes until they are completely dry. Do not soak your feet as this can dry your skin.  Apply a moisturizing lotion or petroleum jelly (that does not contain alcohol and is unscented) to the skin on your feet and to dry, brittle toenails. Do not apply lotion between your toes.  Trim your toenails straight across. Do not dig under them or around the cuticle. File the edges of your nails with an emery board or nail file.  Do not cut corns or calluses or try to remove them with medicine.  Wear clean socks or stockings every day. Make sure they are not too tight. Do not wear knee-high stockings since they may decrease blood flow to your legs.  Wear shoes that fit properly and have enough cushioning. To break in new shoes, wear them for just a few hours a day. This prevents you from injuring your feet. Always look in your shoes before you put them on to be sure there are no objects inside.  Do not cross your legs. This may decrease the blood flow to your feet.  If you find a minor scrape,  cut, or break in the skin on your feet, keep it and the skin around it clean and dry. These areas may be cleansed with mild soap and water. Do not cleanse the area with peroxide, alcohol, or iodine.  When you remove an adhesive bandage, be sure not to damage the skin around it.  If you have a wound, look at it several times a day to make sure it is healing.  Do not use heating pads or hot water bottles. They may burn your skin. If you have lost feeling in your feet or legs, you may not know it is happening until it is too late.  Make sure your health care provider performs a complete foot exam at least annually or more often if you have foot problems. Report any cuts, sores, or bruises to your health care provider immediately. SEEK MEDICAL CARE IF:   You have an injury that is not healing.  You have cuts or breaks in the skin.  You have an ingrown nail.  You notice redness on your legs or feet.  You feel burning or tingling in your legs or feet.  You have pain or cramps in your legs and feet.  Your legs or feet are numb.  Your feet always feel cold. SEEK IMMEDIATE MEDICAL CARE IF:   There is increasing redness,   swelling, or pain in or around a wound.  There is a red line that goes up your leg.  Pus is coming from a wound.  You develop a fever or as directed by your health care provider.  You notice a bad smell coming from an ulcer or wound. Document Released: 07/19/2000 Document Revised: 03/24/2013 Document Reviewed: 12/29/2012 [employer] Patient Information 2015 [employer]. This information is not intended to replace advice given to you by your health care provider. Make sure you discuss any questions you have with your health care provider.  

## 2014-09-01 ENCOUNTER — Encounter: Payer: Self-pay | Admitting: Physician Assistant

## 2014-09-01 NOTE — Progress Notes (Signed)
Chief Complaint  Patient presents with  . Debridement    Trim toenails  . Callouses    Trim calluses 1st toe left and 5th toe right       HPI: Patient presents today for follow up of diabetic foot and nail care. Past medical history, meds, and allergies reviewed. Patient states blood sugar is under good  control. Patient also has ckd  Objective:   Objective:  Patients chart is reviewed.  Vascular status reveals pedal pulses noted at  1 out of 4 dp and pt bilateral .  Neurological sensation is Decreased to [facility] monofilament bilateral at 3/5 sites bilateral.  Dermatological exam reveals   pre ulcerative/ hyperkeratotic lesions present first toe left and fifth toe right.   Toenails are elongated, incurvated, discolored, dystrophic with ingrown deformity present.    Assessment: Diabetes with angiopathy, neuropathy, symptomatic and mycotic toenails x 10, hyperkeratotic lesions x 2  Plan: Discussed treatment options and alternatives. Debrided nails without complication.  Lesions were debrided without complication.  Return appointment recommended at routine intervals of 3 months.

## 2014-09-07 ENCOUNTER — Other Ambulatory Visit: Payer: Self-pay | Admitting: Internal Medicine

## 2014-09-16 ENCOUNTER — Ambulatory Visit (INDEPENDENT_AMBULATORY_CARE_PROVIDER_SITE_OTHER): Payer: Medicare Other | Admitting: Internal Medicine

## 2014-09-16 VITALS — BP 132/66 | HR 60 | Temp 97.9°F | Resp 16 | Ht 63.0 in | Wt 157.8 lb

## 2014-09-16 DIAGNOSIS — R0600 Dyspnea, unspecified: Secondary | ICD-10-CM

## 2014-09-16 DIAGNOSIS — E119 Type 2 diabetes mellitus without complications: Secondary | ICD-10-CM | POA: Insufficient documentation

## 2014-09-16 DIAGNOSIS — N183 Chronic kidney disease, stage 3 (moderate): Secondary | ICD-10-CM

## 2014-09-16 DIAGNOSIS — Z79899 Other long term (current) drug therapy: Secondary | ICD-10-CM

## 2014-09-16 DIAGNOSIS — I10 Essential (primary) hypertension: Secondary | ICD-10-CM

## 2014-09-16 DIAGNOSIS — E785 Hyperlipidemia, unspecified: Secondary | ICD-10-CM

## 2014-09-16 DIAGNOSIS — Z1212 Encounter for screening for malignant neoplasm of rectum: Secondary | ICD-10-CM

## 2014-09-16 DIAGNOSIS — E1149 Type 2 diabetes mellitus with other diabetic neurological complication: Secondary | ICD-10-CM

## 2014-09-16 DIAGNOSIS — E559 Vitamin D deficiency, unspecified: Secondary | ICD-10-CM

## 2014-09-16 DIAGNOSIS — N3000 Acute cystitis without hematuria: Secondary | ICD-10-CM

## 2014-09-16 DIAGNOSIS — R300 Dysuria: Secondary | ICD-10-CM

## 2014-09-16 DIAGNOSIS — E1122 Type 2 diabetes mellitus with diabetic chronic kidney disease: Secondary | ICD-10-CM

## 2014-09-16 LAB — CBC WITH DIFFERENTIAL/PLATELET
BASOS PCT: 1 % (ref 0–1)
Basophils Absolute: 0 10*3/uL (ref 0.0–0.1)
EOS ABS: 0.2 10*3/uL (ref 0.0–0.7)
Eosinophils Relative: 4 % (ref 0–5)
HCT: 36.9 % (ref 36.0–46.0)
HEMOGLOBIN: 12 g/dL (ref 12.0–15.0)
LYMPHS ABS: 1.3 10*3/uL (ref 0.7–4.0)
LYMPHS PCT: 29 % (ref 12–46)
MCH: 31.9 pg (ref 26.0–34.0)
MCHC: 32.5 g/dL (ref 30.0–36.0)
MCV: 98.1 fL (ref 78.0–100.0)
MONO ABS: 0.4 10*3/uL (ref 0.1–1.0)
MPV: 10.7 fL (ref 8.6–12.4)
Monocytes Relative: 8 % (ref 3–12)
NEUTROS ABS: 2.7 10*3/uL (ref 1.7–7.7)
Neutrophils Relative %: 58 % (ref 43–77)
Platelets: 204 10*3/uL (ref 150–400)
RBC: 3.76 MIL/uL — AB (ref 3.87–5.11)
RDW: 14.3 % (ref 11.5–15.5)
WBC: 4.6 10*3/uL (ref 4.0–10.5)

## 2014-09-16 NOTE — Patient Instructions (Signed)

## 2014-09-17 LAB — URINALYSIS, MICROSCOPIC ONLY
Casts: NONE SEEN
Crystals: NONE SEEN

## 2014-09-17 LAB — URINALYSIS, ROUTINE W REFLEX MICROSCOPIC
Bilirubin Urine: NEGATIVE
Glucose, UA: NEGATIVE mg/dL
Hgb urine dipstick: NEGATIVE
Ketones, ur: NEGATIVE mg/dL
Nitrite: NEGATIVE
Protein, ur: NEGATIVE mg/dL
Specific Gravity, Urine: 1.012 (ref 1.005–1.030)
Urobilinogen, UA: 0.2 mg/dL (ref 0.0–1.0)
pH: 5.5 (ref 5.0–8.0)

## 2014-09-17 LAB — LIPID PANEL
Cholesterol: 198 mg/dL (ref 0–200)
HDL: 61 mg/dL (ref >39)
LDL Cholesterol: 121 mg/dL — ABNORMAL HIGH (ref 0–99)
Total CHOL/HDL Ratio: 3.2 Ratio
Triglycerides: 81 mg/dL (ref <150)
VLDL: 16 mg/dL (ref 0–40)

## 2014-09-17 LAB — BASIC METABOLIC PANEL WITH GFR
BUN: 52 mg/dL — AB (ref 6–23)
CALCIUM: 8.4 mg/dL (ref 8.4–10.5)
CHLORIDE: 96 meq/L (ref 96–112)
CO2: 32 mEq/L (ref 19–32)
Creat: 1.79 mg/dL — ABNORMAL HIGH (ref 0.50–1.10)
GFR, Est African American: 28 mL/min — ABNORMAL LOW
GFR, Est Non African American: 25 mL/min — ABNORMAL LOW
Glucose, Bld: 103 mg/dL — ABNORMAL HIGH (ref 70–99)
POTASSIUM: 4.1 meq/L (ref 3.5–5.3)
Sodium: 137 mEq/L (ref 135–145)

## 2014-09-17 LAB — HEPATIC FUNCTION PANEL
ALBUMIN: 3.6 g/dL (ref 3.5–5.2)
ALT: <8 U/L (ref 0–35)
AST: 16 U/L (ref 0–37)
Alkaline Phosphatase: 60 U/L (ref 39–117)
BILIRUBIN INDIRECT: 0.4 mg/dL (ref 0.2–1.2)
Bilirubin, Direct: 0.1 mg/dL (ref 0.0–0.3)
Total Bilirubin: 0.5 mg/dL (ref 0.2–1.2)
Total Protein: 6.1 g/dL (ref 6.0–8.3)

## 2014-09-17 LAB — VITAMIN D 25 HYDROXY (VIT D DEFICIENCY, FRACTURES): Vit D, 25-Hydroxy: 48 ng/mL (ref 30–100)

## 2014-09-17 LAB — INSULIN, FASTING: Insulin fasting, serum: 5.6 u[IU]/mL (ref 2.0–19.6)

## 2014-09-17 LAB — HEMOGLOBIN A1C
Hgb A1c MFr Bld: 6.4 % — ABNORMAL HIGH (ref <5.7)
Mean Plasma Glucose: 137 mg/dL — ABNORMAL HIGH (ref <117)

## 2014-09-17 LAB — TSH: TSH: 2.346 u[IU]/mL (ref 0.350–4.500)

## 2014-09-17 LAB — MAGNESIUM: Magnesium: 2.4 mg/dL (ref 1.5–2.5)

## 2014-09-18 ENCOUNTER — Encounter: Payer: Self-pay | Admitting: Internal Medicine

## 2014-09-18 ENCOUNTER — Other Ambulatory Visit: Payer: Self-pay | Admitting: Internal Medicine

## 2014-09-18 DIAGNOSIS — F32A Depression, unspecified: Secondary | ICD-10-CM | POA: Insufficient documentation

## 2014-09-18 DIAGNOSIS — F329 Major depressive disorder, single episode, unspecified: Secondary | ICD-10-CM | POA: Insufficient documentation

## 2014-09-18 LAB — URINE CULTURE: Colony Count: >=100000

## 2014-09-18 NOTE — Progress Notes (Signed)
Patient ID: Ashley ShaggyVirginia H [name], female   DOB: 28-Aug-1921, 81 y.o.   MRN: [id number]   This very nice 81 [facility]. [facility] for 3 month follow up with Hypertension, Hyperlipidemia, T2_NIDDM w/CKD4 and Vitamin D Deficiency.    Patient is treated for HTN sinec 1990 & BP has been controlled at home. Today's BP: 132/66 mmHg. Patient has had no complaints of any cardiac type chest pain or palpitations. Patient is very sedentary/deconditioned and has DOE but no orthopnea/PND, dizziness, claudication, or dependent edema.   Hyperlipidemia is controlled with diet & meds. Patient denies myalgias or other med SE's. Last Lipids were not at goal - Total Chol 198; HDL  61; LDL  121; Trig 81 on 09/16/2014.   Also, the patient has history of T2_NIDDM since 1990 w/CKD4 and has had no symptoms of reactive hypoglycemia, diabetic polys, paresthesias or visual blurring.  Last A1c was 6.4% on  09/16/2014.   Further, the patient also has history of Vitamin D Deficiency and supplements vitamin D without any suspected side-effects. Last vitamin D was  48 on 09/16/2014.  Medication Sig  . allopurinol (ZYLOPRIM) 100 MG tablet TAKE 1 TABLET (100 MG TOTAL) BY MOUTH DAILY.  Marland Kitchen. aspirin EC 81 MG EC tablet Take 1 tablet (81 mg total) by mouth daily.  . B Complex Vitamins (VITAMIN-B COMPLEX PO) Take 1 tablet by mouth daily.   . carvedilol (COREG) 6.25 MG tablet Take 1 tablet (6.25 mg total) by mouth 2 (two) times daily with a meal.  . Cholecalciferol (VITAMIN D) 2000 UNITS CAPS Take 2,000 Units by mouth daily.  . furosemide (LASIX) 40 MG tablet Take 40 mg by mouth daily.  Marland Kitchen. gabapentin (300 MG capsule TAKE 1 CAP qAM, THEN 1-2 CAP qhs FOR LEG PAINS  .  NORCO 5-325 MG  1/2-1 tabs in PM as needed for pain  . levothyroxine 50 MCG tablet 1/2 tablet daily except take 1 tablet Tues, Thurs  . losartan  25 MG tablet Take 1 tablet (25 mg total) by mouth daily.  Marland Kitchen. MAGNESIUM PO Take 1 tablet by mouth daily.   . Menaquinone-7 (VITAMIN K2 PO) Take  50 mcg by mouth every other day.  . Omega-3 Fatty Acids (FISH OIL PO) Take 1 capsule by mouth daily.   Marland Kitchen. PROBIOTIC DAILY Take 1 tablet by mouth daily.   . sertraline (ZOLOFT) 100 MG tablet Take 0.5 tablets (50 mg total) by mouth daily.  . furosemide (LASIX) 40 MG tablet TAKE 1 TABLET BY MOUTH TWICE A DAY  . furosemide (LASIX) 40 MG tablet TAKE 1 TABLET BY MOUTH TWICE A DAY  . TURMERIC PO Take by mouth daily.   Allergies  Allergen Reactions  . Ace Inhibitors   . Codeine     dysuria  . Lisinopril Swelling  . Morphine And Related     dysuria  . Percocet [Oxycodone-Acetaminophen]     dysuria  . Vitamin C     dysuria  . Vitamin D Analogs     Large doses cause dysuria   PMHx:   Past Medical History  Diagnosis Date  . Hyperlipidemia   . Hypertension   . Type II or unspecified type diabetes mellitus without mention of complication, not stated as uncontrolled   . Diabetic neuropathy   . Chronic kidney disease   . GERD (gastroesophageal reflux disease)   . Osteoporosis   . CHF (congestive heart failure)   . Cardiomyopathy    Immunization History  Administered Date(s) Administered  . Influenza  Split 05/06/2013  . Pneumococcal Polysaccharide-23 02/23/2013  . Td 02/23/2013   Past Surgical History  Procedure Laterality Date  . Nephrectomy Right 1959  . Breast lumpectomy      [phone]  . Bladder surgery  1984  . Colostomy  2004    Reversed 2005  . Eye surgery  2008 and 2009   FHx:    Reviewed / unchanged  SHx:    Reviewed / unchanged  Systems Review:  Constitutional: Denies fever, chills, wt changes, headaches, insomnia, fatigue, night sweats, change in appetite. Eyes: Denies redness, blurred vision, diplopia, discharge, itchy, watery eyes.  ENT: Denies discharge, congestion, post nasal drip, epistaxis, sore throat, earache, hearing loss, dental pain, tinnitus, vertigo, sinus pain, snoring.  CV: Denies chest pain, palpitations, irregular heartbeat, syncope, dyspnea,  diaphoresis, orthopnea, PND, claudication or edema. Respiratory: denies cough, dyspnea, DOE, pleurisy, hoarseness, laryngitis, wheezing.  Gastrointestinal: Denies dysphagia, odynophagia, heartburn, reflux, water brash, abdominal pain or cramps, nausea, vomiting, bloating, diarrhea, constipation, hematemesis, melena, hematochezia  or hemorrhoids. Genitourinary: Denies dysuria, frequency, urgency, nocturia, hesitancy, discharge, hematuria or flank pain. Musculoskeletal: Denies arthralgias, myalgias, stiffness, jt. swelling, pain, limping or strain/sprain. Denies falls.  Skin: Denies pruritus, rash, hives, warts, acne, eczema or change in skin lesion(s). Neuro: No weakness, tremor, incoordination, spasms, paresthesia or pain. Psychiatric: Denies confusion, memory loss or sensory loss. Denies Depression sx's. Endo: Denies change in weight, skin or hair change.  Heme/Lymph: No excessive bleeding, bruising or enlarged lymph nodes.  Physical Exam  BP 132/66  Pulse 60  Temp 97.9 F   Resp 16  Ht   Wt 157 lb 12.8 oz     BMI 27.96   Appears well nourished and in no distress. Eyes: PERRLA, EOMs, conjunctiva no swelling or erythema. Sinuses: No frontal/maxillary tenderness ENT/Mouth: EAC's clear, TM's nl w/o erythema, bulging. Nares clear w/o erythema, swelling, exudates. Oropharynx clear without erythema or exudates. Oral hygiene is good. Tongue normal, non obstructing. Hearing intact.  Neck: Supple. Thyroid nl. Car 2+/2+ without bruits, nodes or JVD. Chest: Respirations nl with BS clear & equal w/o rales, rhonchi, wheezing or stridor.  Cor: Heart sounds normal w/ regular rate and rhythm without sig. murmurs, gallops, clicks, or rubs. Peripheral pulses normal and equal  without edema.  Abdomen: Soft & bowel sounds normal. Non-tender w/o guarding, rebound, hernias, masses, or organomegaly.  Lymphatics: Unremarkable.  Musculoskeletal: Full ROM all peripheral extremities, joint stability, 5/5  strength, and normal gait.  Skin: Warm, dry without exposed rashes, lesions or ecchymosis apparent.  Neuro: Cranial nerves intact, reflexes equal bilaterally. Sensory-motor testing grossly intact. Tendon reflexes grossly intact.  Pysch: Alert & oriented x 3.  Insight and judgement nl & appropriate. No ideations.  Assessment and Plan:  1. Essential hypertension  - TSH  2. Hyperlipidemia  - Lipid panel  3. Other diabetic neurological complication associated with type 2 diabetes mellitus   4. Diabetes mellitus type II, controlled  - Hemoglobin A1c - Insulin, fasting  5. CKD stage 3 due to type 2 diabetes mellitus   6. Vitamin D deficiency  - Vit D  25 hydroxy (rtn osteoporosis monitoring)  7. Encounter for long-term (current) use of medications  - CBC with Differential/Platelet - BASIC METABOLIC PANEL WITH GFR - Hepatic function panel - Magnesium  8. Urinary frequency  - Urinalysis, Routine w reflex microscopic - Urine culture   Recommended regular exercise, BP monitoring, weight control, and discussed med and SE's. Recommended labs to assess and monitor clinical status. Further disposition  pending results of labs.

## 2014-09-19 ENCOUNTER — Encounter: Payer: Self-pay | Admitting: Physician Assistant

## 2014-09-30 ENCOUNTER — Encounter (HOSPITAL_COMMUNITY): Payer: Self-pay | Admitting: Emergency Medicine

## 2014-09-30 ENCOUNTER — Emergency Department (HOSPITAL_COMMUNITY)
Admission: EM | Admit: 2014-09-30 | Discharge: 2014-10-01 | Disposition: A | Discharge status: Home / Self Care | Payer: Medicare Other | Attending: Emergency Medicine | Admitting: Emergency Medicine

## 2014-09-30 ENCOUNTER — Emergency Department (HOSPITAL_COMMUNITY): Payer: Medicare Other

## 2014-09-30 DIAGNOSIS — S22008A Other fracture of unspecified thoracic vertebra, initial encounter for closed fracture: Secondary | ICD-10-CM | POA: Insufficient documentation

## 2014-09-30 DIAGNOSIS — W1839XA Other fall on same level, initial encounter: Secondary | ICD-10-CM | POA: Insufficient documentation

## 2014-09-30 DIAGNOSIS — Z79899 Other long term (current) drug therapy: Secondary | ICD-10-CM | POA: Diagnosis not present

## 2014-09-30 DIAGNOSIS — S22000A Wedge compression fracture of unspecified thoracic vertebra, initial encounter for closed fracture: Secondary | ICD-10-CM

## 2014-09-30 DIAGNOSIS — M810 Age-related osteoporosis without current pathological fracture: Secondary | ICD-10-CM | POA: Diagnosis not present

## 2014-09-30 DIAGNOSIS — E1140 Type 2 diabetes mellitus with diabetic neuropathy, unspecified: Secondary | ICD-10-CM | POA: Insufficient documentation

## 2014-09-30 DIAGNOSIS — Y998 Other external cause status: Secondary | ICD-10-CM | POA: Insufficient documentation

## 2014-09-30 DIAGNOSIS — Z7982 Long term (current) use of aspirin: Secondary | ICD-10-CM | POA: Insufficient documentation

## 2014-09-30 DIAGNOSIS — I129 Hypertensive chronic kidney disease with stage 1 through stage 4 chronic kidney disease, or unspecified chronic kidney disease: Secondary | ICD-10-CM | POA: Diagnosis not present

## 2014-09-30 DIAGNOSIS — N189 Chronic kidney disease, unspecified: Secondary | ICD-10-CM | POA: Diagnosis not present

## 2014-09-30 DIAGNOSIS — Y9289 Other specified places as the place of occurrence of the external cause: Secondary | ICD-10-CM | POA: Insufficient documentation

## 2014-09-30 DIAGNOSIS — I509 Heart failure, unspecified: Secondary | ICD-10-CM | POA: Diagnosis not present

## 2014-09-30 DIAGNOSIS — K219 Gastro-esophageal reflux disease without esophagitis: Secondary | ICD-10-CM | POA: Insufficient documentation

## 2014-09-30 DIAGNOSIS — F0390 Unspecified dementia without behavioral disturbance: Secondary | ICD-10-CM | POA: Diagnosis not present

## 2014-09-30 DIAGNOSIS — Y9389 Activity, other specified: Secondary | ICD-10-CM | POA: Insufficient documentation

## 2014-09-30 DIAGNOSIS — S29092A Other injury of muscle and tendon of back wall of thorax, initial encounter: Secondary | ICD-10-CM | POA: Diagnosis present

## 2014-09-30 DIAGNOSIS — W19XXXA Unspecified fall, initial encounter: Secondary | ICD-10-CM

## 2014-09-30 DIAGNOSIS — E785 Hyperlipidemia, unspecified: Secondary | ICD-10-CM | POA: Insufficient documentation

## 2014-09-30 LAB — CBC WITH DIFFERENTIAL/PLATELET
BASOS ABS: 0 10*3/uL (ref 0.0–0.1)
Basophils Relative: 0 % (ref 0–1)
EOS PCT: 1 % (ref 0–5)
Eosinophils Absolute: 0.1 10*3/uL (ref 0.0–0.7)
HEMATOCRIT: 34.4 % — AB (ref 36.0–46.0)
HEMOGLOBIN: 11.2 g/dL — AB (ref 12.0–15.0)
Lymphocytes Relative: 18 % (ref 12–46)
Lymphs Abs: 1.4 10*3/uL (ref 0.7–4.0)
MCH: 31.9 pg (ref 26.0–34.0)
MCHC: 32.6 g/dL (ref 30.0–36.0)
MCV: 98 fL (ref 78.0–100.0)
Monocytes Absolute: 0.4 10*3/uL (ref 0.1–1.0)
Monocytes Relative: 5 % (ref 3–12)
Neutro Abs: 5.7 10*3/uL (ref 1.7–7.7)
Neutrophils Relative %: 76 % (ref 43–77)
PLATELETS: 182 10*3/uL (ref 150–400)
RBC: 3.51 MIL/uL — ABNORMAL LOW (ref 3.87–5.11)
RDW: 13.8 % (ref 11.5–15.5)
WBC: 7.6 10*3/uL (ref 4.0–10.5)

## 2014-09-30 LAB — URINALYSIS, ROUTINE W REFLEX MICROSCOPIC
Bilirubin Urine: NEGATIVE
GLUCOSE, UA: NEGATIVE mg/dL
Hgb urine dipstick: NEGATIVE
Ketones, ur: NEGATIVE mg/dL
LEUKOCYTES UA: NEGATIVE
Nitrite: NEGATIVE
PROTEIN: NEGATIVE mg/dL
Specific Gravity, Urine: 1.011 (ref 1.005–1.030)
UROBILINOGEN UA: 0.2 mg/dL (ref 0.0–1.0)
pH: 7 (ref 5.0–8.0)

## 2014-09-30 LAB — COMPREHENSIVE METABOLIC PANEL
ALT: 11 U/L (ref 0–35)
AST: 20 U/L (ref 0–37)
Albumin: 3.4 g/dL — ABNORMAL LOW (ref 3.5–5.2)
Alkaline Phosphatase: 57 U/L (ref 39–117)
Anion gap: 9 (ref 5–15)
BILIRUBIN TOTAL: 0.3 mg/dL (ref 0.3–1.2)
BUN: 49 mg/dL — ABNORMAL HIGH (ref 6–23)
CALCIUM: 8.4 mg/dL (ref 8.4–10.5)
CO2: 29 mmol/L (ref 19–32)
CREATININE: 1.79 mg/dL — AB (ref 0.50–1.10)
Chloride: 102 mmol/L (ref 96–112)
GFR calc Af Amer: 28 mL/min — ABNORMAL LOW (ref >90)
GFR, EST NON AFRICAN AMERICAN: 24 mL/min — AB (ref >90)
GLUCOSE: 170 mg/dL — AB (ref 70–99)
POTASSIUM: 4.2 mmol/L (ref 3.5–5.1)
SODIUM: 140 mmol/L (ref 135–145)
Total Protein: 6 g/dL (ref 6.0–8.3)

## 2014-09-30 LAB — TROPONIN I: Troponin I: <0.03 ng/mL (ref <0.031)

## 2014-09-30 MED ORDER — SODIUM CHLORIDE 0.9 % IV BOLUS (SEPSIS)
500.0000 mL | Freq: Once | INTRAVENOUS | Status: AC
  Administered 2014-09-30: 500 mL via INTRAVENOUS

## 2014-09-30 NOTE — ED Notes (Signed)
Pt fell in store today c/o mid back pain and hematoma to head; pt alert per norm at present and denies anticoagulation

## 2014-09-30 NOTE — ED Notes (Signed)
Pt daughter stated that on the way to the hospital the pt was complaining of right sided flank pain and was worried that she may have injured her ribs.

## 2014-09-30 NOTE — ED Notes (Signed)
Pt placed into gown upon arrival to room. Pt monitored by blood pressure and pulse ox. Pts family remains at bedside.

## 2014-10-01 ENCOUNTER — Other Ambulatory Visit: Payer: Self-pay | Admitting: Physician Assistant

## 2014-10-01 NOTE — ED Provider Notes (Signed)
CSN: [id number]     Arrival date & time 09/30/14  1728 History   First MD Initiated Contact with Patient 09/30/14 2220     Chief Complaint  Patient presents with  . Fall   Level V caveat due to some dementia  (Consider location/radiation/quality/duration/timing/severity/associated sxs/prior Treatment) Patient is a 81 y.o. female presenting with fall. The history is provided by the patient and a relative.  Fall This is a recurrent problem.   patient had a fall. Both states she just lost her balance and fell and that she became lightheaded and fell. Was decreased responsiveness for little bit but then came back to normal. This complaining of some mid back pain now. She did not eat much today. Patient is a DO NOT RESUSCITATE and does not want overly aggressive treatment. No chest pain. No trouble breathing. No dysuria. Patient and her daughter provides the history.  Past Medical History  Diagnosis Date  . Hyperlipidemia   . Hypertension   . Type II or unspecified type diabetes mellitus without mention of complication, not stated as uncontrolled   . Diabetic neuropathy   . Chronic kidney disease   . GERD (gastroesophageal reflux disease)   . Osteoporosis   . CHF (congestive heart failure)   . Cardiomyopathy    Past Surgical History  Procedure Laterality Date  . Nephrectomy Right 1959  . Breast lumpectomy      [phone]  . Bladder surgery  1984  . Colostomy  2004    Reversed 2005  . Eye surgery  2008 and 2009   Family History  Problem Relation Age of Onset  . Heart disease Father   . Heart disease Sister   . Diabetes Daughter   . Hypertension Son    History  Substance Use Topics  . Smoking status: Never Smoker   . Smokeless tobacco: Not on file  . Alcohol Use: Yes     Comment: Occ wine states she is going to stop   OB History    No data available     Review of Systems  Unable to perform ROS     Allergies  Ace inhibitors; Codeine; Lisinopril; Morphine and  related; Percocet; Vitamin c; and Vitamin d analogs  Home Medications   Prior to Admission medications   Medication Sig Start Date End Date Taking? Authorizing Provider  allopurinol (ZYLOPRIM) 100 MG tablet TAKE 1 TABLET (100 MG TOTAL) BY MOUTH DAILY. 06/28/14   Lucky Cowboy, MD  aspirin EC 81 MG EC tablet Take 1 tablet (81 mg total) by mouth daily. 11/07/13   Marinda Elk, MD  B Complex Vitamins (VITAMIN-B COMPLEX PO) Take 1 tablet by mouth daily.     Historical Provider, MD  carvedilol (COREG) 6.25 MG tablet Take 1 tablet (6.25 mg total) by mouth 2 (two) times daily with a meal. 05/18/14   Lewayne Bunting, MD  Cholecalciferol (VITAMIN D) 2000 UNITS CAPS Take 2,000 Units by mouth daily.    Historical Provider, MD  furosemide (LASIX) 40 MG tablet Take 40 mg by mouth daily. 11/07/13   Marinda Elk, MD  gabapentin (NEURONTIN) 300 MG capsule TAKE 1 CAPSULE BY MOUTH IN AM, THEN 1 TO 2 CAPSULES BY MOUTH AT BEDTIME FOR LEG PAINS 07/10/14   Lucky Cowboy, MD  HYDROcodone-acetaminophen (NORCO/VICODIN) 5-325 MG per tablet 1/2-1 tabs in PM as needed for pain 11/15/13   Quentin Mulling, PA-C  levothyroxine (SYNTHROID, LEVOTHROID) 50 MCG tablet 1/2 tablet daily except take 1 tablet Tues, Thurs 02/02/14  Quentin Mulling, PA-C  losartan (COZAAR) 25 MG tablet Take 1 tablet (25 mg total) by mouth daily. 07/12/14   Lewayne Bunting, MD  MAGNESIUM PO Take 1 tablet by mouth daily.     Historical Provider, MD  Menaquinone-7 (VITAMIN K2 PO) Take 50 mcg by mouth every other day.    Historical Provider, MD  Omega-3 Fatty Acids (FISH OIL PO) Take 1 capsule by mouth daily.     Historical Provider, MD  OVER THE COUNTER MEDICATION Take 2 tablets by mouth daily. Tumeric    Historical Provider, MD  Probiotic Product (PROBIOTIC DAILY PO) Take 1 tablet by mouth daily.     Historical Provider, MD  sertraline (ZOLOFT) 100 MG tablet Take 0.5 tablets (50 mg total) by mouth daily. 01/10/14   Quentin Mulling, PA-C   BP  107/87 mmHg  Pulse 75  Temp(Src) 98.2 F (36.8 C) (Oral)  Resp 22  SpO2 98% Physical Exam  Constitutional: She appears well-developed and well-nourished.  HENT:  Head: Normocephalic.  Cardiovascular: Normal rate and regular rhythm.   Pulmonary/Chest: Effort normal.  Abdominal: Soft.  Musculoskeletal:  Mild mid thoracic posterior midline back pain. No step-off or deformity.  Neurological:  Mild dementia.    ED Course  Procedures (including critical care time) Labs Review Labs Reviewed  CBC WITH DIFFERENTIAL/PLATELET - Abnormal; Notable for the following:    RBC 3.51 (*)    Hemoglobin 11.2 (*)    HCT 34.4 (*)    All other components within normal limits  COMPREHENSIVE METABOLIC PANEL - Abnormal; Notable for the following:    Glucose, Bld 170 (*)    BUN 49 (*)    Creatinine, Ser 1.79 (*)    Albumin 3.4 (*)    GFR calc non Af Amer 24 (*)    GFR calc Af Amer 28 (*)    All other components within normal limits  TROPONIN I  URINALYSIS, ROUTINE W REFLEX MICROSCOPIC    Imaging Review Dg Chest 2 View  10/01/2014   CLINICAL DATA:  Right flank pain after fall earlier this day.  EXAM: CHEST  2 VIEW  COMPARISON:  Chest radiograph 11/04/2013, 11/03/2013  FINDINGS: Stable to decreased cardiomegaly. Decreased consolidation at the left lung base with minimal residual opacity and small left pleural effusion. Diffuse chronic interstitial changes are stable. There is no new airspace consolidation. No pneumothorax. No displaced rib fractures seen. There is mild compression deformity of a mid thoracic vertebral body.  IMPRESSION: 1. Small left pleural effusion and ill-defined defined opacity at the left lung base. This is improved compared to prior exam and may be related to residual chronic scarring and fluid. 2. Compression deformity of a midthoracic vertebral body, this is age indeterminate, however appears new from prior exam 10 months prior.   Electronically Signed   By: Rubye Oaks M.D.    On: 10/01/2014 00:09   Ct Head Wo Contrast  09/30/2014   CLINICAL DATA:  Patient fell in the store today. Mid back pain. Hematoma to the head.  EXAM: CT HEAD WITHOUT CONTRAST  CT CERVICAL SPINE WITHOUT CONTRAST  TECHNIQUE: Multidetector CT imaging of the head and cervical spine was performed following the standard protocol without intravenous contrast. Multiplanar CT image reconstructions of the cervical spine were also generated.  COMPARISON:  None.  FINDINGS: CT HEAD FINDINGS  Diffuse cerebral atrophy. Mild ventricular dilatation consistent with central atrophy. Low-attenuation changes in the deep white matter consistent with small vessel ischemia. No mass effect or midline shift.  No abnormal extra-axial fluid collections. Gray-white matter junctions are distinct. Basal cisterns are not effaced. No evidence of acute intracranial hemorrhage. No depressed skull fractures. Visualized paranasal sinuses and mastoid air cells are not opacified. Vascular calcifications. Diffuse increased density in the left globe probably represents postoperative change with silicone injection but diffuse hemorrhage could also have this appearance. Clinical correlation recommended. Moderate-sized right posterior parietal scalp hematoma.  CT CERVICAL SPINE FINDINGS  There is straightening of the usual cervical lordosis. This may be due to patient positioning but ligamentous injury or muscle spasm could also have this appearance. No anterior subluxation. Normal alignment of the facet joints. Diffuse degenerative changes throughout the cervical spine with narrowed cervical interspaces and bridging anterior osteophytes. Prominent posterior endplate osteophytes at C4-5, C5-6, and C6-7 levels. Prominent degenerative changes in the facet joints with partial coalition of the posterior facets at C3-4 and C6-7 levels. No vertebral compression deformities. There is diffuse bone demineralization. No prevertebral soft tissue swelling. No focal  bone lesion or bone destruction. C1-2 articulation appears intact. Vascular calcifications.  IMPRESSION: No acute intracranial abnormalities. Chronic atrophy and small vessel ischemic changes. Diffuse increased density in the left globe probably due to postoperative change but clinical evaluation for possible diffuse hemorrhage is recommended.  Diffuse degenerative change throughout the cervical spine. Nonspecific straightening of the usual cervical lordosis. No acute displaced fractures identified.   Electronically Signed   By: Burman NievesWilliam  Stevens M.D.   On: 09/30/2014 20:08   Ct Cervical Spine Wo Contrast  09/30/2014   CLINICAL DATA:  Patient fell in the store today. Mid back pain. Hematoma to the head.  EXAM: CT HEAD WITHOUT CONTRAST  CT CERVICAL SPINE WITHOUT CONTRAST  TECHNIQUE: Multidetector CT imaging of the head and cervical spine was performed following the standard protocol without intravenous contrast. Multiplanar CT image reconstructions of the cervical spine were also generated.  COMPARISON:  None.  FINDINGS: CT HEAD FINDINGS  Diffuse cerebral atrophy. Mild ventricular dilatation consistent with central atrophy. Low-attenuation changes in the deep white matter consistent with small vessel ischemia. No mass effect or midline shift. No abnormal extra-axial fluid collections. Gray-white matter junctions are distinct. Basal cisterns are not effaced. No evidence of acute intracranial hemorrhage. No depressed skull fractures. Visualized paranasal sinuses and mastoid air cells are not opacified. Vascular calcifications. Diffuse increased density in the left globe probably represents postoperative change with silicone injection but diffuse hemorrhage could also have this appearance. Clinical correlation recommended. Moderate-sized right posterior parietal scalp hematoma.  CT CERVICAL SPINE FINDINGS  There is straightening of the usual cervical lordosis. This may be due to patient positioning but ligamentous  injury or muscle spasm could also have this appearance. No anterior subluxation. Normal alignment of the facet joints. Diffuse degenerative changes throughout the cervical spine with narrowed cervical interspaces and bridging anterior osteophytes. Prominent posterior endplate osteophytes at C4-5, C5-6, and C6-7 levels. Prominent degenerative changes in the facet joints with partial coalition of the posterior facets at C3-4 and C6-7 levels. No vertebral compression deformities. There is diffuse bone demineralization. No prevertebral soft tissue swelling. No focal bone lesion or bone destruction. C1-2 articulation appears intact. Vascular calcifications.  IMPRESSION: No acute intracranial abnormalities. Chronic atrophy and small vessel ischemic changes. Diffuse increased density in the left globe probably due to postoperative change but clinical evaluation for possible diffuse hemorrhage is recommended.  Diffuse degenerative change throughout the cervical spine. Nonspecific straightening of the usual cervical lordosis. No acute displaced fractures identified.   Electronically Signed  By: Burman Nieves M.D.   On: 09/30/2014 20:08     EKG Interpretation None      MDM   Final diagnoses:  Fall, initial encounter  Thoracic compression fracture, closed, initial encounter    Patient with fall. Some generalized weakness may proceed it. Patient and family did not want overly aggressive treatment. For example if she needs more Lasix they would not want that. Lab work overall reassuring. Negative troponin. EKG reviewed but unable to find in muse. Offered admission for monitoring for arrhythmia but patient would not want overly aggressive treatment and would rather go home. Will discharge home.    Juliet Rude. Rubin Payor, MD 10/01/14 [phone]

## 2014-10-01 NOTE — Discharge Instructions (Signed)
Vertebral Fracture  You have a fracture of one or more vertebra. These are the bony parts that form the spine. Minor vertebral fractures happen when people fall. Osteoporosis is associated with many of these fractures. Hospital care may not be necessary for minor compression fractures that are stable. However, multiple fractures of the spine or unstable injuries can cause severe pain and even damage the spinal cord. A spinal cord injury may cause paralysis, numbness, or loss of normal bowel and bladder control.   Normally there is pain and stiffness in the back for 3 to 6 weeks after a vertebral fracture. Bed rest for several days, pain medicine, and a slow return to activity is often the only treatment that is needed depending on the location of the fracture. Neck and back braces may be helpful in reducing pain and increasing mobility. When your pain allows, you should begin walking or swimming to help maintain your endurance. Exercises to improve motion and to strengthen the back may also be useful after the initial pain improves. Treatment for osteoporosis may be essential for full recovery. This will help reduce your risk of vertebral fractures with a future fall.  During the first few days after a spine fracture you may feel nauseated or vomit. If this is severe, hospital care with IV fluids will be needed.   Arrange for follow-up care as recommended to assure proper long-term care and prevention of further spine injury.   SEEK IMMEDIATE MEDICAL CARE IF:   You have increasing pain, vomiting, or are unable to move around at all.   You develop numbness, tingling, weakness, or paralysis of any part of your body.   You develop a loss of normal bowel or bladder control.   You have difficulty breathing, cough, fever, chest or abdominal pain.  MAKE SURE YOU:    Understand these instructions.   Will watch your condition.   Will get help right away if you are not doing well or get worse.  Document Released:  08/29/2004 Document Revised: 10/14/2011 Document Reviewed: 03/14/2009  [employer] Patient Information 2015 [employer]. This information is not intended to replace advice given to you by your health care provider. Make sure you discuss any questions you have with your health care provider.

## 2014-10-27 ENCOUNTER — Ambulatory Visit (INDEPENDENT_AMBULATORY_CARE_PROVIDER_SITE_OTHER): Payer: Medicare Other | Admitting: Podiatry

## 2014-10-27 DIAGNOSIS — M79673 Pain in unspecified foot: Secondary | ICD-10-CM

## 2014-10-27 DIAGNOSIS — B351 Tinea unguium: Secondary | ICD-10-CM | POA: Diagnosis not present

## 2014-10-27 DIAGNOSIS — Q828 Other specified congenital malformations of skin: Secondary | ICD-10-CM

## 2014-10-27 NOTE — Progress Notes (Signed)
Presents today chief complaint of painful elongated toenails.  Objective: Pulses are palpable bilateral nails are thick, yellow dystrophic onychomycosis and painful palpation.   Assessment: Onychomycosis with pain in limb.  Plan: Treatment of nails in thickness and length as covered service secondary to pain.  

## 2014-11-07 ENCOUNTER — Other Ambulatory Visit: Payer: Self-pay | Admitting: *Deleted

## 2014-11-07 DIAGNOSIS — I5041 Acute combined systolic (congestive) and diastolic (congestive) heart failure: Secondary | ICD-10-CM

## 2014-11-07 MED ORDER — LOSARTAN POTASSIUM 25 MG PO TABS: 25.0000 mg | ORAL_TABLET | Freq: Every day | ORAL | Status: DC

## 2014-11-07 MED ORDER — CARVEDILOL 6.25 MG PO TABS: 6.2500 mg | ORAL_TABLET | Freq: Two times a day (BID) | ORAL | Status: DC

## 2014-11-18 NOTE — Progress Notes (Signed)
HPI: FU CHF. Admitted with CHF and pneumonia in April 2015. Echocardiogram 4/15 revealed severe systolic dysfunction EF 25-30% biatrial enlargement and moderate mitral regurgitation and severely elevated pulmonary pressures. Small pericardial effusion. She was treated with IV Lasix and renal function was followed closely given history of nephrectomy and baseline renal insufficiency. Cozaar was started but then stopped because of increased creatinine. She did not want aggressive procedures including cardiac catheterization. Medical therapy was recommended. Venous dopplers 5/15 showed no DVT. She has declined further cardiac testing. Since she was last seen, she does have dyspnea on exertion but no orthopnea or PND. Minimal pedal edema. No chest pain.  Current Outpatient Prescriptions  Medication Sig Dispense Refill  . allopurinol (ZYLOPRIM) 100 MG tablet TAKE 1 TABLET (100 MG TOTAL) BY MOUTH DAILY. 30 tablet PRN  . aspirin EC 81 MG EC tablet Take 1 tablet (81 mg total) by mouth daily.    . B Complex Vitamins (VITAMIN-B COMPLEX PO) Take 1 tablet by mouth daily.     . carvedilol (COREG) 6.25 MG tablet Take 1 tablet (6.25 mg total) by mouth 2 (two) times daily with a meal. 60 tablet 0  . Cholecalciferol (VITAMIN D) 2000 UNITS CAPS Take 2,000 Units by mouth daily.    . furosemide (LASIX) 40 MG tablet Take 40 mg by mouth daily.    Marland Kitchen gabapentin (NEURONTIN) 300 MG capsule TAKE 1 CAPSULE BY MOUTH IN AM, THEN 1 TO 2 CAPSULES BY MOUTH AT BEDTIME FOR LEG PAINS 90 capsule 5  . HYDROcodone-acetaminophen (NORCO/VICODIN) 5-325 MG per tablet 1/2-1 tabs in PM as needed for pain    . levothyroxine (SYNTHROID, LEVOTHROID) 50 MCG tablet 1/2 tablet daily except take 1 tablet Tues, Thurs 90 tablet 2  . losartan (COZAAR) 25 MG tablet Take 1 tablet (25 mg total) by mouth daily. 30 tablet 0  . MAGNESIUM PO Take 1 tablet by mouth daily.     . Menaquinone-7 (VITAMIN K2 PO) Take 50 mcg by mouth every other day.    .  Omega-3 Fatty Acids (FISH OIL PO) Take 1 capsule by mouth daily.     Marland Kitchen OVER THE COUNTER MEDICATION Take 2 tablets by mouth daily. Tumeric    . Probiotic Product (PROBIOTIC DAILY PO) Take 1 tablet by mouth daily.     . sertraline (ZOLOFT) 100 MG tablet TAKE 1/2 TABLET BY MOUTH EVERY DAY 90 tablet 1   No current facility-administered medications for this visit.     Past Medical History  Diagnosis Date  . Hyperlipidemia   . Hypertension   . Type II or unspecified type diabetes mellitus without mention of complication, not stated as uncontrolled   . Diabetic neuropathy   . Chronic kidney disease   . GERD (gastroesophageal reflux disease)   . Osteoporosis   . CHF (congestive heart failure)   . Cardiomyopathy     Past Surgical History  Procedure Laterality Date  . Nephrectomy Right 1959  . Breast lumpectomy      [phone]  . Bladder surgery  1984  . Colostomy  2004    Reversed 2005  . Eye surgery  2008 and 2009    History   Social History  . Marital Status: Widowed    Spouse Name: N/A  . Number of Children: 2  . Years of Education: N/A   Occupational History  . Not on file.   Social History Main Topics  . Smoking status: Never Smoker   . Smokeless tobacco: Not  on file  . Alcohol Use: Yes     Comment: Occ wine states she is going to stop  . Drug Use: No  . Sexual Activity: No   Other Topics Concern  . Not on file   Social History Narrative    ROS: hip arthralgias but no fevers or chills, productive cough, hemoptysis, dysphasia, odynophagia, melena, hematochezia, dysuria, hematuria, rash, seizure activity, orthopnea, PND, claudication. Remaining systems are negative.  Physical Exam: Well-developed well-nourished in no acute distress.  Skin is warm and dry.  HEENT is normal.  Neck is supple.  Chest is clear to auscultation with normal expansion.  Cardiovascular exam is regular rate and rhythm.  Abdominal exam nontender or distended. No masses  palpated. Extremities show trace edema. neuro grossly intact  ECG sinus rhythm, left bundle branch block.

## 2014-11-21 ENCOUNTER — Encounter: Payer: Self-pay | Admitting: Cardiology

## 2014-11-21 ENCOUNTER — Ambulatory Visit (INDEPENDENT_AMBULATORY_CARE_PROVIDER_SITE_OTHER): Payer: Medicare Other | Admitting: Cardiology

## 2014-11-21 VITALS — BP 138/56 | HR 63 | Ht 64.0 in | Wt 162.9 lb

## 2014-11-21 DIAGNOSIS — I429 Cardiomyopathy, unspecified: Secondary | ICD-10-CM | POA: Diagnosis not present

## 2014-11-21 DIAGNOSIS — I5041 Acute combined systolic (congestive) and diastolic (congestive) heart failure: Secondary | ICD-10-CM | POA: Diagnosis not present

## 2014-11-21 DIAGNOSIS — I10 Essential (primary) hypertension: Secondary | ICD-10-CM | POA: Diagnosis not present

## 2014-11-21 NOTE — Assessment & Plan Note (Signed)
Continue present blood pressure medications. 

## 2014-11-21 NOTE — Assessment & Plan Note (Signed)
Plan to continue present dose of Lasix.patient asked about discontinuing all of her medications today. She feels that they are prolonging her life which she does not want. I explained that if she discontinues her medications she will most likely develop congestive heart failure and dyspnea and her symptoms would not be pleasant. She really will therefore continue.

## 2014-11-21 NOTE — Assessment & Plan Note (Signed)
Continue beta blocker and ARB. She does not want further cardiac testing which is appropriate.

## 2014-11-21 NOTE — Patient Instructions (Signed)
Your physician wants you to follow-up in: ONE YEAR WITH DR CRENSHAW You will receive a reminder letter in the mail two months in advance. If you don't receive a letter, please call our office to schedule the follow-up appointment.  

## 2014-11-22 ENCOUNTER — Ambulatory Visit (INDEPENDENT_AMBULATORY_CARE_PROVIDER_SITE_OTHER): Payer: Medicare Other | Admitting: *Deleted

## 2014-11-22 DIAGNOSIS — H6123 Impacted cerumen, bilateral: Secondary | ICD-10-CM

## 2014-11-22 NOTE — Progress Notes (Signed)
Patient ID: Ashley ShaggyVirginia H [name], female   DOB: [birth year], 81 y.o.   MRN: [id number] Patient presents for bilateral  ear lavage for cerumen impaction. Successful clearing of cerumen from both ears.

## 2014-12-05 ENCOUNTER — Telehealth: Payer: Self-pay | Admitting: *Deleted

## 2014-12-05 MED ORDER — SERTRALINE HCL 100 MG PO TABS: 100.0000 mg | ORAL_TABLET | Freq: Every day | ORAL | Status: DC

## 2014-12-05 NOTE — Telephone Encounter (Signed)
Son called and asked if patient's Zoloft dose can increase from 50 mg to 100 mg daily.  OK to increase per Dr Oneta RackMcKeown.

## 2014-12-09 ENCOUNTER — Other Ambulatory Visit: Payer: Self-pay | Admitting: *Deleted

## 2014-12-09 DIAGNOSIS — I5041 Acute combined systolic (congestive) and diastolic (congestive) heart failure: Secondary | ICD-10-CM

## 2014-12-09 MED ORDER — CARVEDILOL 6.25 MG PO TABS: 6.2500 mg | ORAL_TABLET | Freq: Two times a day (BID) | ORAL | Status: DC

## 2014-12-09 MED ORDER — LOSARTAN POTASSIUM 25 MG PO TABS: 25.0000 mg | ORAL_TABLET | Freq: Every day | ORAL | Status: DC

## 2014-12-14 ENCOUNTER — Encounter: Payer: Self-pay | Admitting: Physician Assistant

## 2015-01-03 ENCOUNTER — Ambulatory Visit (INDEPENDENT_AMBULATORY_CARE_PROVIDER_SITE_OTHER): Payer: Medicare Other | Admitting: Physician Assistant

## 2015-01-03 ENCOUNTER — Encounter: Payer: Self-pay | Admitting: Physician Assistant

## 2015-01-03 VITALS — BP 138/78 | HR 68 | Temp 97.9°F | Resp 16 | Ht 63.0 in | Wt 167.0 lb

## 2015-01-03 DIAGNOSIS — E119 Type 2 diabetes mellitus without complications: Secondary | ICD-10-CM

## 2015-01-03 DIAGNOSIS — E1122 Type 2 diabetes mellitus with diabetic chronic kidney disease: Secondary | ICD-10-CM

## 2015-01-03 DIAGNOSIS — N183 Chronic kidney disease, stage 3 unspecified: Secondary | ICD-10-CM

## 2015-01-03 DIAGNOSIS — E785 Hyperlipidemia, unspecified: Secondary | ICD-10-CM

## 2015-01-03 DIAGNOSIS — I10 Essential (primary) hypertension: Secondary | ICD-10-CM

## 2015-01-03 DIAGNOSIS — Z79899 Other long term (current) drug therapy: Secondary | ICD-10-CM

## 2015-01-03 DIAGNOSIS — E1149 Type 2 diabetes mellitus with other diabetic neurological complication: Secondary | ICD-10-CM

## 2015-01-03 DIAGNOSIS — I5022 Chronic systolic (congestive) heart failure: Secondary | ICD-10-CM

## 2015-01-03 LAB — CBC WITH DIFFERENTIAL/PLATELET
Basophils Absolute: 0 10*3/uL (ref 0.0–0.1)
Basophils Relative: 0 % (ref 0–1)
Eosinophils Absolute: 0.2 10*3/uL (ref 0.0–0.7)
Eosinophils Relative: 3 % (ref 0–5)
HCT: 36.1 % (ref 36.0–46.0)
Hemoglobin: 11.8 g/dL — ABNORMAL LOW (ref 12.0–15.0)
LYMPHS PCT: 29 % (ref 12–46)
Lymphs Abs: 1.5 10*3/uL (ref 0.7–4.0)
MCH: 32.2 pg (ref 26.0–34.0)
MCHC: 32.7 g/dL (ref 30.0–36.0)
MCV: 98.4 fL (ref 78.0–100.0)
MONO ABS: 0.3 10*3/uL (ref 0.1–1.0)
MONOS PCT: 6 % (ref 3–12)
MPV: 10.5 fL (ref 8.6–12.4)
NEUTROS PCT: 62 % (ref 43–77)
Neutro Abs: 3.1 10*3/uL (ref 1.7–7.7)
PLATELETS: 240 10*3/uL (ref 150–400)
RBC: 3.67 MIL/uL — AB (ref 3.87–5.11)
RDW: 14.5 % (ref 11.5–15.5)
WBC: 5 10*3/uL (ref 4.0–10.5)

## 2015-01-03 LAB — HEPATIC FUNCTION PANEL
ALK PHOS: 67 U/L (ref 39–117)
AST: 13 U/L (ref 0–37)
Albumin: 3.7 g/dL (ref 3.5–5.2)
BILIRUBIN INDIRECT: 0.4 mg/dL (ref 0.2–1.2)
Bilirubin, Direct: 0.1 mg/dL (ref 0.0–0.3)
TOTAL PROTEIN: 6.2 g/dL (ref 6.0–8.3)
Total Bilirubin: 0.5 mg/dL (ref 0.2–1.2)

## 2015-01-03 LAB — MAGNESIUM: Magnesium: 2.4 mg/dL (ref 1.5–2.5)

## 2015-01-03 LAB — LIPID PANEL
Cholesterol: 229 mg/dL — ABNORMAL HIGH (ref 0–200)
HDL: 58 mg/dL (ref >=46)
LDL CALC: 151 mg/dL — AB (ref 0–99)
Total CHOL/HDL Ratio: 3.9 Ratio
Triglycerides: 101 mg/dL (ref <150)
VLDL: 20 mg/dL (ref 0–40)

## 2015-01-03 LAB — BASIC METABOLIC PANEL WITH GFR
BUN: 48 mg/dL — AB (ref 6–23)
CO2: 31 mEq/L (ref 19–32)
Calcium: 8.6 mg/dL (ref 8.4–10.5)
Chloride: 99 mEq/L (ref 96–112)
Creat: 1.8 mg/dL — ABNORMAL HIGH (ref 0.50–1.10)
GFR, EST AFRICAN AMERICAN: 28 mL/min — AB
GFR, Est Non African American: 24 mL/min — ABNORMAL LOW
GLUCOSE: 82 mg/dL (ref 70–99)
POTASSIUM: 4.6 meq/L (ref 3.5–5.3)
Sodium: 141 mEq/L (ref 135–145)

## 2015-01-03 LAB — HEMOGLOBIN A1C
Hgb A1c MFr Bld: 6.8 % — ABNORMAL HIGH (ref <5.7)
Mean Plasma Glucose: 148 mg/dL — ABNORMAL HIGH (ref <117)

## 2015-01-03 LAB — TSH: TSH: 2.248 u[IU]/mL (ref 0.350–4.500)

## 2015-01-03 NOTE — Patient Instructions (Signed)
HOME CARE INSTRUCTIONS   Do not stand or sit in one position for long periods of time. Do not sit with your legs crossed. Rest with your legs raised during the day.  Your legs have to be higher than your heart so that gravity will force the valves to open, so please really elevate your legs.   Wear elastic stockings or support hose. Do not wear other tight, encircling garments around the legs, pelvis, or waist.  [employer]  has a wide variety of well priced compression stockings. [place], [place] [address] [phone]  Walk as much as possible to increase blood flow.  Raise the foot of your bed at night with 2-inch blocks.  Can you vaseline or neosporin for your legs, can also make a past with betadine and sugar for 1-2 days.   Heart Failure Heart failure is a condition in which the heart has trouble pumping blood. This means your heart does not pump blood efficiently for your body to work well. In some cases of heart failure, fluid may back up into your lungs or you may have swelling (edema) in your lower legs. Heart failure is usually a long-term (chronic) condition. It is important for you to take good care of yourself and follow your health care provider's treatment plan. CAUSES  Some health conditions can cause heart failure. Those health conditions include:  High blood pressure (hypertension). Hypertension causes the heart muscle to work harder than normal. When pressure in the blood vessels is high, the heart needs to pump (contract) with more force in order to circulate blood throughout the body. High blood pressure eventually causes the heart to become stiff and weak.  Coronary artery disease (CAD). CAD is the buildup of cholesterol and fat (plaque) in the arteries of the heart. The blockage in the arteries deprives the heart muscle of oxygen and blood. This can cause chest pain and may lead to a heart attack. High blood pressure can also contribute to  CAD.  Heart attack (myocardial infarction). A heart attack occurs when one or more arteries in the heart become blocked. The loss of oxygen damages the muscle tissue of the heart. When this happens, part of the heart muscle dies. The injured tissue does not contract as well and weakens the heart's ability to pump blood.  Abnormal heart valves. When the heart valves do not open and close properly, it can cause heart failure. This makes the heart muscle pump harder to keep the blood flowing.  Heart muscle disease (cardiomyopathy or myocarditis). Heart muscle disease is damage to the heart muscle from a variety of causes. These can include drug or alcohol abuse, infections, or unknown reasons. These can increase the risk of heart failure.  Lung disease. Lung disease makes the heart work harder because the lungs do not work properly. This can cause a strain on the heart, leading it to fail.  Diabetes. Diabetes increases the risk of heart failure. High blood sugar contributes to high fat (lipid) levels in the blood. Diabetes can also cause slow damage to tiny blood vessels that carry important nutrients to the heart muscle. When the heart does not get enough oxygen and food, it can cause the heart to become weak and stiff. This leads to a heart that does not contract efficiently.  Other conditions can contribute to heart failure. These include abnormal heart rhythms, thyroid problems, and low blood counts (anemia). Certain unhealthy behaviors can increase the risk of heart failure,  including:  Being overweight.  Smoking or chewing tobacco.  Eating foods high in fat and cholesterol.  Abusing illicit drugs or alcohol.  Lacking physical activity. SYMPTOMS  Heart failure symptoms may vary and can be hard to detect. Symptoms may include:  Shortness of breath with activity, such as climbing stairs.  Persistent cough.  Swelling of the feet, ankles, legs, or abdomen.  Unexplained weight  gain.  Difficulty breathing when lying flat (orthopnea).  Waking from sleep because of the need to sit up and get more air.  Rapid heartbeat.  Fatigue and loss of energy.  Feeling light-headed, dizzy, or close to fainting.  Loss of appetite.  Nausea.  Increased urination during the night (nocturia). DIAGNOSIS  A diagnosis of heart failure is based on your history, symptoms, physical examination, and diagnostic tests. Diagnostic tests for heart failure may include:  Echocardiography.  Electrocardiography.  Chest X-ray.  Blood tests.  Exercise stress test.  Cardiac angiography.  Radionuclide scans. TREATMENT  Treatment is aimed at managing the symptoms of heart failure. Medicines, behavioral changes, or surgical intervention may be necessary to treat heart failure.  Medicines to help treat heart failure may include:  Angiotensin-converting enzyme (ACE) inhibitors. This type of medicine blocks the effects of a blood protein called angiotensin-converting enzyme. ACE inhibitors relax (dilate) the blood vessels and help lower blood pressure.  Angiotensin receptor blockers (ARBs). This type of medicine blocks the actions of a blood protein called angiotensin. Angiotensin receptor blockers dilate the blood vessels and help lower blood pressure.  Water pills (diuretics). Diuretics cause the kidneys to remove salt and water from the blood. The extra fluid is removed through urination. This loss of extra fluid lowers the volume of blood the heart pumps.  Beta blockers. These prevent the heart from beating too fast and improve heart muscle strength.  Digitalis. This increases the force of the heartbeat.  Healthy behavior changes include:  Obtaining and maintaining a healthy weight.  Stopping smoking or chewing tobacco.  Eating heart-healthy foods.  Limiting or avoiding alcohol.  Stopping illicit drug use.  Physical activity as directed by your health care  provider.  Surgical treatment for heart failure may include:  A procedure to open blocked arteries, repair damaged heart valves, or remove damaged heart muscle tissue.  A pacemaker to improve heart muscle function and control certain abnormal heart rhythms.  An internal cardioverter defibrillator to treat certain serious abnormal heart rhythms.  A left ventricular assist device (LVAD) to assist the pumping ability of the heart. HOME CARE INSTRUCTIONS   Take medicines only as directed by your health care provider. Medicines are important in reducing the workload of your heart, slowing the progression of heart failure, and improving your symptoms.  Do not stop taking your medicine unless directed by your health care provider.  Do not skip any dose of medicine.  Refill your prescriptions before you run out of medicine. Your medicines are needed every day.  Engage in moderate physical activity if directed by your health care provider. Moderate physical activity can benefit some people. The elderly and people with severe heart failure should consult with a health care provider for physical activity recommendations.  Eat heart-healthy foods. Food choices should be free of trans fat and low in saturated fat, cholesterol, and salt (sodium). Healthy choices include fresh or frozen fruits and vegetables, fish, lean meats, legumes, fat-free or low-fat dairy products, and whole grain or high fiber foods. Talk to a dietitian to learn more about heart-healthy foods.  Limit sodium if directed by your health care provider. Sodium restriction may reduce symptoms of heart failure in some people. Talk to a dietitian to learn more about heart-healthy seasonings.  Use healthy cooking methods. Healthy cooking methods include roasting, grilling, broiling, baking, poaching, steaming, or stir-frying. Talk to a dietitian to learn more about healthy cooking methods.  Limit fluids if directed by your health care  provider. Fluid restriction may reduce symptoms of heart failure in some people.  Weigh yourself every day. Daily weights are important in the early recognition of excess fluid. You should weigh yourself every morning after you urinate and before you eat breakfast. Wear the same amount of clothing each time you weigh yourself. Record your daily weight. Provide your health care provider with your weight record.  Monitor and record your blood pressure if directed by your health care provider.  Check your pulse if directed by your health care provider.  Lose weight if directed by your health care provider. Weight loss may reduce symptoms of heart failure in some people.  Stop smoking or chewing tobacco. Nicotine makes your heart work harder by causing your blood vessels to constrict. Do not use nicotine gum or patches before talking to your health care provider.  Keep all follow-up visits as directed by your health care provider. This is important.  Limit alcohol intake to no more than 1 drink per day for nonpregnant women and 2 drinks per day for men. One drink equals 12 ounces of beer, 5 ounces of wine, or 1 ounces of hard liquor. Drinking more than that is harmful to your heart. Tell your health care provider if you drink alcohol several times a week. Talk with your health care provider about whether alcohol is safe for you. If your heart has already been damaged by alcohol or you have severe heart failure, drinking alcohol should be stopped completely.  Stop illicit drug use.  Stay up-to-date with immunizations. It is especially important to prevent respiratory infections through current pneumococcal and influenza immunizations.  Manage other health conditions such as hypertension, diabetes, thyroid disease, or abnormal heart rhythms as directed by your health care provider.  Learn to manage stress.  Plan rest periods when fatigued.  Learn strategies to manage high temperatures. If the  weather is extremely hot:  Avoid vigorous physical activity.  Use air conditioning or fans or seek a cooler location.  Avoid caffeine and alcohol.  Wear loose-fitting, lightweight, and light-colored clothing.  Learn strategies to manage cold temperatures. If the weather is extremely cold:  Avoid vigorous physical activity.  Layer clothes.  Wear mittens or gloves, a hat, and a scarf when going outside.  Avoid alcohol.  Obtain ongoing education and support as needed.  Participate in or seek rehabilitation as needed to maintain or improve independence and quality of life. SEEK MEDICAL CARE IF:   Your weight increases by 03 lb/1.4 kg in 1 day or 05 lb/2.3 kg in a week.  You have increasing shortness of breath that is unusual for you.  You are unable to participate in your usual physical activities.  You tire easily.  You cough more than normal, especially with physical activity.  You have any or more swelling in areas such as your hands, feet, ankles, or abdomen.  You are unable to sleep because it is hard to breathe.  You feel like your heart is beating fast (palpitations).  You become dizzy or light-headed upon standing up. SEEK IMMEDIATE MEDICAL CARE IF:   You  have difficulty breathing.  There is a change in mental status such as decreased alertness or difficulty with concentration.  You have a pain or discomfort in your chest.  You have an episode of fainting (syncope). MAKE SURE YOU:   Understand these instructions.  Will watch your condition.  Will get help right away if you are not doing well or get worse. Document Released: 07/22/2005 Document Revised: 12/06/2013 Document Reviewed: 08/21/2012 [facility] Patient Information 2015 [place], [place]. This information is not intended to replace advice given to you by your health care provider. Make sure you discuss any questions you have with your health care provider.  [place] is a care service  which can be used by people who are terminally ill and in whom healing is no longer thought possible.  It is meant to help with the two largest fears near the end of life (the fears of dying and of being alone), as well as pain management, and an attempt to allow people to pass away comfortably at home.  Hospice staff:  Administer appropriate pain relief.  Provide nursing care.  Offer reassurance and support to loved ones and family members.  Provide services to keep people comfortable at home or in a hospice facility. Together, you can see to it that your loved one is not alone during this last and important phase of life. You, your family, and your caregivers help you decide when hospice services should begin. If your condition improves or the disease goes into remission, you can be discharged from the hospice program. You can return to hospice care at a later time if needed. The hospice philosophy recognizes death as the final stage of life. It helps patients continue an alert, pain-free life, and manage symptoms while surrounded by their loved ones. Hospice affirms life without hurrying death. Hospice care treats the person rather than the disease. It emphasizes quality of life with family-centered care. Hospice care involves the patient and family and helps them make decisions.  The care is designed to:  Relieve or decrease pain.  Control other problems.  Provide as much quality time as possible.  Allow people to die with dignity. The goal of hospice care is to offer as high a quality of life as possible during the end of life. In this way, the last days of life may be spent with dignity.  With hospice care, instead of spending the last weeks or months in a hospital, a person is with loved ones in the home or a homelike setting. About 90 percent of hospice care is provided at home. But hospice is available wherever a person lives, including a nursing home or assisted-living residence.  Some residential hospices designed specifically for hospice care also exist. Hospice care is available for many types of terminal illnesses. Hospice services are meant to serve both the patient and family members.  Comfort. In most cases, the individual stays in his or her home or in homelike surroundings instead of in a hospital. The core of hospice is a cooperative effort by family, friends and a team of professional and volunteer caregivers working together to meet your loved one's needs. This team supplies all necessary medicines and equipment. It works with both the person involved and family members to relieve pain and symptoms.  Support. Individuals enjoy the support of loved ones by receiving much of the basic care from family and friends. A nurse may lead the team and coordinates the day-to-day care. A doctor is  also part of the team. Chaplains and social workers are available to counsel the family and their loved one. They make sure emotional, spiritual, and social needs are being met. Trained volunteers perform a wide variety of tasks as needed, such as:  Providing companionship.  Doing light housekeeping.  Preparing meals.  Running errands.  Providing respite for the family.  Improving quality of life. Caring for someone who is dying is emotionally and physically demanding. This can be particularly true for family members who are primary caregivers. But you can take comfort in knowing that hospice is an act of love that can improve the quality of life for all involved. Professionals are often available to tend to the needs of grieving family members as well.  Spiritual Care. Hospice care emphasizes the spiritual needs of you and your family. People differ in their spiritual needs and religious beliefs so spiritual care is individualized to meet the persons' and family's needs. It may include helping you to look at what death means to you, to say good-bye, or to perform a specific  religious ceremony or ritual. HOW TO SELECT A PROGRAM Most hospice programs are run by nonprofit, independent organizations. Some are affiliated with hospitals, nursing homes or home health care agencies. Some are for-profit organizations. You can learn about existing hospice programs in your area from your health caregivers. ASK THE FOLLOWING:  What services are available to the patient?  What services are offered to the family?  Are bereavement services available?  How involved are the family members?  How involved is the doctor?  Who makes up the hospice care team? How are they trained or screened?  How will the individual's pain and symptoms be managed?  If circumstances change, can services be provided in different settings, such as the home or the hospital?  Is the program reviewed and licensed by the state or certified in some other way?  Are all costs covered by insurance? How much you pay for hospice care can vary greatly. It depends on the length and type of care necessary and your insurance coverage. Medicare and most private insurance plans, including managed care organizations, cover hospice care. Hospice is also covered by Medicaid in most states. Some hospice programs provide services on a sliding fee scale, based on your ability to pay. They may also provide some durable medical equipment for support within the home.

## 2015-01-03 NOTE — Progress Notes (Signed)
Assessment and Plan:  1. Hypertension -Continue medication, monitor blood pressure at home. Continue DASH diet.  Reminder to go to the ER if any CP, SOB, nausea, dizziness, severe HA, changes vision/speech, left arm numbness and tingling and jaw pain.  2. Cholesterol -Continue diet and exercise. Check cholesterol.   3. 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 Check A1C  4. Vitamin D Def - check level and continue medications.   5. CHF Monitor weight, continue coreg and lasix  Medications Discontinued During This Encounter  Medication Reason  . Omega-3 Fatty Acids (FISH OIL PO)   . losartan (COZAAR) 25 MG tablet   . HYDROcodone-acetaminophen (NORCO/VICODIN) 5-325 MG per tablet    Continue diet and meds as discussed. Further disposition pending results of labs. Discussed med's effects and SE's.   Over 30 minutes of exam, counseling, chart review, and critical decision making was performed Discussed she is DNR, no significant work ups, discussed hospice/end of life.   Future Appointments Date Time Provider Department Center  02/09/2015 11:00 AM [facility] NURSE [facility]  04/18/2015 11:00 AM Ashley Cowboy, MD [facility] None    HPI 80 y.o. female  presents for 3 month follow up on hypertension, cholesterol, diabetes and vitamin D deficiency.   Her blood pressure has been controlled at home, today her BP is BP: 138/78 mmHg.  She does not workout. She denies chest pain, shortness of breath, dizziness. She has a history of Systolic CHF, denies dyspnea on exertion, orthopnea and paroxysmal nocturnal dyspnea. Positive for edema. She is on coreg and lasix, follows with Dr. Jens [name]. Wt Readings from Last 3 Encounters:  01/03/15 167 lb (75.751 kg)  11/21/14 162 lb 14.4 oz (73.891 kg)  09/16/14 157 lb 12.8 oz (71.578 kg)   She is not on cholesterol medication and denies myalgias. Her cholesterol is at goal. The  cholesterol was:   Lab Results  Component Value Date   CHOL 198 09/16/2014   HDL 61 09/16/2014   LDLCALC 121* 09/16/2014   TRIG 81 09/16/2014   CHOLHDL 3.2 09/16/2014    She has been working on diet and exercise for diabetes with diabetic chronic kidney disease and with diabetic polyneuropathy, she is on bASA, she is on ACE/ARB, and denies  polydipsia and polyuria. Last A1C was:  Lab Results  Component Value Date   HGBA1C 6.4* 09/16/2014    Patient is on Vitamin D supplement. Lab Results  Component Value Date   VD25OH 48 09/16/2014   She is on thyroid medication. Her medication was not changed last visit.   Lab Results  Component Value Date   TSH 2.346 09/16/2014  .   Current Medications:  Current Outpatient Prescriptions on File Prior to Visit  Medication Sig Dispense Refill  . allopurinol (ZYLOPRIM) 100 MG tablet TAKE 1 TABLET (100 MG TOTAL) BY MOUTH DAILY. 30 tablet PRN  . aspirin EC 81 MG EC tablet Take 1 tablet (81 mg total) by mouth daily.    . B Complex Vitamins (VITAMIN-B COMPLEX PO) Take 1 tablet by mouth daily.     . carvedilol (COREG) 6.25 MG tablet Take 1 tablet (6.25 mg total) by mouth 2 (two) times daily with a meal. 60 tablet 11  . Cholecalciferol (VITAMIN D) 2000 UNITS CAPS Take 2,000 Units by mouth daily.    . furosemide (LASIX) 40 MG tablet Take 40 mg by mouth daily.    Marland Kitchen gabapentin (NEURONTIN) 300 MG capsule TAKE 1 CAPSULE BY  MOUTH IN AM, THEN 1 TO 2 CAPSULES BY MOUTH AT BEDTIME FOR LEG PAINS 90 capsule 5  . HYDROcodone-acetaminophen (NORCO/VICODIN) 5-325 MG per tablet 1/2-1 tabs in PM as needed for pain    . levothyroxine (SYNTHROID, LEVOTHROID) 50 MCG tablet 1/2 tablet daily except take 1 tablet Tues, Thurs 90 tablet 2  . losartan (COZAAR) 25 MG tablet Take 1 tablet (25 mg total) by mouth daily. 30 tablet 11  . MAGNESIUM PO Take 1 tablet by mouth daily.     . Menaquinone-7 (VITAMIN K2 PO) Take 50 mcg by mouth every other day.    . Omega-3 Fatty Acids (FISH  OIL PO) Take 1 capsule by mouth daily.     Marland Kitchen. OVER THE COUNTER MEDICATION Take 2 tablets by mouth daily. Tumeric    . Probiotic Product (PROBIOTIC DAILY PO) Take 1 tablet by mouth daily.     . sertraline (ZOLOFT) 100 MG tablet Take 1 tablet (100 mg total) by mouth daily. 90 tablet 1   No current facility-administered medications on file prior to visit.   Medical History:  Past Medical History  Diagnosis Date  . Hyperlipidemia   . Hypertension   . Type II or unspecified type diabetes mellitus without mention of complication, not stated as uncontrolled   . Diabetic neuropathy   . Chronic kidney disease   . GERD (gastroesophageal reflux disease)   . Osteoporosis   . CHF (congestive heart failure)   . Cardiomyopathy    Allergies:  Allergies  Allergen Reactions  . Ace Inhibitors   . Codeine     dysuria  . Lisinopril Swelling  . Morphine And Related     dysuria  . Percocet [Oxycodone-Acetaminophen]     dysuria  . Vitamin C     dysuria  . Vitamin D Analogs     Large doses cause dysuria     Review of Systems:  Review of Systems  Constitutional: Negative.   HENT: Negative.   Respiratory: Positive for shortness of breath. Negative for cough, hemoptysis, sputum production and wheezing.   Cardiovascular: Positive for leg swelling. Negative for chest pain, palpitations, orthopnea, claudication and PND.  Gastrointestinal: Negative.   Genitourinary: Negative.   Musculoskeletal: Positive for back pain, joint pain and falls. Negative for myalgias and neck pain.  Skin: Negative.   Neurological: Negative.   Psychiatric/Behavioral: Negative.     Family history- Review and unchanged Social history- Review and unchanged Physical Exam: BP 138/78 mmHg  Pulse 68  Temp(Src) 97.9 F (36.6 C)  Resp 16  Wt 167 lb (75.751 kg) Wt Readings from Last 3 Encounters:  01/03/15 167 lb (75.751 kg)  11/21/14 162 lb 14.4 oz (73.891 kg)  09/16/14 157 lb 12.8 oz (71.578 kg)   HEENT:  normocephalic, sclerae anicteric, TMs pearly, nares patent, no discharge or erythema, pharynx normal Oral cavity: MMM, no lesions Neck: supple, no lymphadenopathy, no thyromegaly, no masses Heart: RRR, normal S1, S2, no murmurs Lungs: CTA bilaterally, no wheezes, rhonchi, or rales Abdomen: +bs, soft, non tender, non distended, no masses, no hepatomegaly, no splenomegaly Musculoskeletal: nontender, no swelling, no obvious deformity Extremities: 1-2+ edema, left greater than right, with left medial malleolus ulcer without erythema, warmth. no cyanosis, no clubbing Pulses: 1+ symmetric, upper and lower extremities, normal cap refill Neurological: alert, oriented x 2, CN2-12 intact, strength normal upper extremities and lower extremities, sensation decreased to mid shins without ulcers DTRs 2+ throughout, no cerebellar signs, Gait with walker, antalgic Psychiatric: normal affect, behavior normal, pleasant  Quentin Mulling, PA-C 2:30 PM [facility]

## 2015-01-17 ENCOUNTER — Other Ambulatory Visit: Payer: Self-pay | Admitting: Internal Medicine

## 2015-01-30 ENCOUNTER — Other Ambulatory Visit: Payer: Self-pay

## 2015-02-09 ENCOUNTER — Encounter: Payer: Self-pay | Admitting: Podiatry

## 2015-02-09 ENCOUNTER — Ambulatory Visit (INDEPENDENT_AMBULATORY_CARE_PROVIDER_SITE_OTHER): Payer: Medicare Other | Admitting: Podiatry

## 2015-02-09 DIAGNOSIS — M79676 Pain in unspecified toe(s): Secondary | ICD-10-CM | POA: Diagnosis not present

## 2015-02-09 DIAGNOSIS — E1151 Type 2 diabetes mellitus with diabetic peripheral angiopathy without gangrene: Secondary | ICD-10-CM | POA: Diagnosis not present

## 2015-02-09 DIAGNOSIS — B351 Tinea unguium: Secondary | ICD-10-CM

## 2015-02-09 NOTE — Progress Notes (Signed)
Subjective:     Patient ID: [name], female   DOB: 11-10-1921, 81 y.o.   MRN: [id number]  HPIThis patient presents to the office for preventive foot care services.  Patient is diabetic with significant swelling of lower leg and ankle because of venous stasis B/L.She is diabetic with vascular complications.   Review of Systems     Objective:   Physical Exam Objective: Review of past medical history, medications, social history and allergies were performed.  Vascular: Dorsalis pedis  were palpable B/L  Her posterior tibial pulses were not palpable due to her swelling. capillary refill was  WNL B/L, temperature gradient was WNL B/L   Skin:  No signs of symptoms of infection or ulcers on both feet  Nails: Thick disfigured discolored nails with no evidence of infection.  Sensory: Phoebe PerchSemmes Weinstein monifilament WNL   Orthopedic: Orthopedic evaluation demonstrates all joints distal t ankle have full ROM without crepitus, muscle power WNL B/L    Assessment:     Onychomycosis     Plan:     Debridement of nails both feet

## 2015-03-08 ENCOUNTER — Encounter: Payer: Self-pay | Admitting: Internal Medicine

## 2015-03-22 ENCOUNTER — Other Ambulatory Visit: Payer: Self-pay | Admitting: Physician Assistant

## 2015-04-18 ENCOUNTER — Encounter: Payer: Self-pay | Admitting: Internal Medicine

## 2015-04-28 ENCOUNTER — Encounter: Payer: Self-pay | Admitting: Internal Medicine

## 2015-05-17 ENCOUNTER — Ambulatory Visit (INDEPENDENT_AMBULATORY_CARE_PROVIDER_SITE_OTHER): Payer: Medicare Other | Admitting: Internal Medicine

## 2015-05-17 ENCOUNTER — Encounter: Payer: Self-pay | Admitting: Physician Assistant

## 2015-05-17 ENCOUNTER — Other Ambulatory Visit: Payer: Self-pay | Admitting: Internal Medicine

## 2015-05-17 ENCOUNTER — Encounter: Payer: Self-pay | Admitting: Internal Medicine

## 2015-05-17 VITALS — BP 128/62 | HR 60 | Temp 97.9°F | Resp 16 | Ht 61.0 in | Wt 160.6 lb

## 2015-05-17 DIAGNOSIS — N183 Chronic kidney disease, stage 3 unspecified: Secondary | ICD-10-CM

## 2015-05-17 DIAGNOSIS — M810 Age-related osteoporosis without current pathological fracture: Secondary | ICD-10-CM | POA: Diagnosis not present

## 2015-05-17 DIAGNOSIS — R296 Repeated falls: Secondary | ICD-10-CM

## 2015-05-17 DIAGNOSIS — E0842 Diabetes mellitus due to underlying condition with diabetic polyneuropathy: Secondary | ICD-10-CM | POA: Diagnosis not present

## 2015-05-17 DIAGNOSIS — Z23 Encounter for immunization: Secondary | ICD-10-CM

## 2015-05-17 DIAGNOSIS — E1121 Type 2 diabetes mellitus with diabetic nephropathy: Secondary | ICD-10-CM

## 2015-05-17 DIAGNOSIS — E1122 Type 2 diabetes mellitus with diabetic chronic kidney disease: Secondary | ICD-10-CM | POA: Diagnosis not present

## 2015-05-17 DIAGNOSIS — Z6830 Body mass index (BMI) 30.0-30.9, adult: Secondary | ICD-10-CM

## 2015-05-17 DIAGNOSIS — Z1389 Encounter for screening for other disorder: Secondary | ICD-10-CM | POA: Diagnosis not present

## 2015-05-17 DIAGNOSIS — M1000 Idiopathic gout, unspecified site: Secondary | ICD-10-CM | POA: Diagnosis not present

## 2015-05-17 DIAGNOSIS — I10 Essential (primary) hypertension: Secondary | ICD-10-CM | POA: Diagnosis not present

## 2015-05-17 DIAGNOSIS — Z79899 Other long term (current) drug therapy: Secondary | ICD-10-CM | POA: Diagnosis not present

## 2015-05-17 DIAGNOSIS — E785 Hyperlipidemia, unspecified: Secondary | ICD-10-CM

## 2015-05-17 DIAGNOSIS — Z9181 History of falling: Secondary | ICD-10-CM

## 2015-05-17 DIAGNOSIS — E559 Vitamin D deficiency, unspecified: Secondary | ICD-10-CM | POA: Diagnosis not present

## 2015-05-17 DIAGNOSIS — M109 Gout, unspecified: Secondary | ICD-10-CM | POA: Insufficient documentation

## 2015-05-17 DIAGNOSIS — F329 Major depressive disorder, single episode, unspecified: Secondary | ICD-10-CM | POA: Diagnosis not present

## 2015-05-17 DIAGNOSIS — F32A Depression, unspecified: Secondary | ICD-10-CM

## 2015-05-17 DIAGNOSIS — Z1331 Encounter for screening for depression: Secondary | ICD-10-CM

## 2015-05-17 DIAGNOSIS — I429 Cardiomyopathy, unspecified: Secondary | ICD-10-CM

## 2015-05-17 DIAGNOSIS — Z1212 Encounter for screening for malignant neoplasm of rectum: Secondary | ICD-10-CM

## 2015-05-17 LAB — BASIC METABOLIC PANEL WITH GFR
BUN: 21 mg/dL (ref 7–25)
CALCIUM: 8.7 mg/dL (ref 8.6–10.4)
CO2: 28 mmol/L (ref 20–31)
Chloride: 98 mmol/L (ref 98–110)
Creat: 1.37 mg/dL — ABNORMAL HIGH (ref 0.60–0.88)
GFR, EST AFRICAN AMERICAN: 39 mL/min — AB (ref >=60)
GFR, EST NON AFRICAN AMERICAN: 34 mL/min — AB (ref >=60)
Glucose, Bld: 106 mg/dL — ABNORMAL HIGH (ref 65–99)
POTASSIUM: 3.7 mmol/L (ref 3.5–5.3)
Sodium: 140 mmol/L (ref 135–146)

## 2015-05-17 LAB — CBC WITH DIFFERENTIAL/PLATELET
BASOS ABS: 0.1 10*3/uL (ref 0.0–0.1)
Basophils Relative: 1 % (ref 0–1)
EOS ABS: 0.2 10*3/uL (ref 0.0–0.7)
EOS PCT: 3 % (ref 0–5)
HCT: 36.5 % (ref 36.0–46.0)
Hemoglobin: 12 g/dL (ref 12.0–15.0)
Lymphocytes Relative: 23 % (ref 12–46)
Lymphs Abs: 1.5 10*3/uL (ref 0.7–4.0)
MCH: 32.1 pg (ref 26.0–34.0)
MCHC: 32.9 g/dL (ref 30.0–36.0)
MCV: 97.6 fL (ref 78.0–100.0)
MPV: 10.2 fL (ref 8.6–12.4)
Monocytes Absolute: 0.3 10*3/uL (ref 0.1–1.0)
Monocytes Relative: 5 % (ref 3–12)
Neutro Abs: 4.4 10*3/uL (ref 1.7–7.7)
Neutrophils Relative %: 68 % (ref 43–77)
PLATELETS: 226 10*3/uL (ref 150–400)
RBC: 3.74 MIL/uL — AB (ref 3.87–5.11)
RDW: 15.2 % (ref 11.5–15.5)
WBC: 6.4 10*3/uL (ref 4.0–10.5)

## 2015-05-17 LAB — LIPID PANEL
CHOLESTEROL: 247 mg/dL — AB (ref 125–200)
HDL: 51 mg/dL (ref >=46)
LDL CALC: 170 mg/dL — AB (ref <130)
Total CHOL/HDL Ratio: 4.8 Ratio (ref <=5.0)
Triglycerides: 131 mg/dL (ref <150)
VLDL: 26 mg/dL (ref <30)

## 2015-05-17 LAB — HEPATIC FUNCTION PANEL
ALK PHOS: 54 U/L (ref 33–130)
ALT: 5 U/L — ABNORMAL LOW (ref 6–29)
AST: 13 U/L (ref 10–35)
Albumin: 3.7 g/dL (ref 3.6–5.1)
BILIRUBIN INDIRECT: 0.5 mg/dL (ref 0.2–1.2)
Bilirubin, Direct: 0.1 mg/dL (ref <=0.2)
Total Bilirubin: 0.6 mg/dL (ref 0.2–1.2)
Total Protein: 6.1 g/dL (ref 6.1–8.1)

## 2015-05-17 LAB — MAGNESIUM: Magnesium: 2 mg/dL (ref 1.5–2.5)

## 2015-05-17 LAB — URIC ACID: URIC ACID, SERUM: 4.9 mg/dL (ref 2.4–7.0)

## 2015-05-17 NOTE — Progress Notes (Addendum)
Patient ID: [name], female   DOB: October 17, 1921, 81 y.o.   MRN: [id number]   Comprehensive Evaluation,  Examination and Management     This delightful  81 y.o. [facility] presents for presents for a comprehensive evaluation, examination and management of multiple medical co-morbidities. Patient has been followed for HTN, T2_NIDDM, Hyperlipidemia, Vascular Dementia, Gout, hx/o involutional Depression  and Vitamin D Deficiency. Patient requires maximal assistance in ADL's & bathes 1 x week for convenience.      Patient lives with [name] who reports her mother has recently been having more hallucinations, eg conversations with children, etc. Also notes that her short term recall is very poor which actually is not unexpected in a 81 yo who is felt tonhave vascular dementia. [name] is advised that of her mother's medications that Sertraline is the one most likely to possibly exacerbate hallucinations and it would be prudent to taper to d/c and consider alternate meds to control hallucinations as well as depression.       HTN predates circa 1990. Patient's BP has been controlled at home and patient denies any cardiac symptoms as chest pain, palpitations, shortness of breath, dizziness or ankle swelling. Today's BP: 128/62 mmHg        Patient's hyperlipidemia is not controlled with diet, but is not aggressively treated at her age of 81 yo. Last lipids were not at goal  With Cholesterol 229*; HDL 58; LDL 151*; Triglycerides 101 on 01/03/2015.     Patient has diet controlled T2_NIDDM predating since 1990 and patient denies reactive hypoglycemic symptoms, visual blurring, diabetic polys, or paresthesias. Last A1c was 6.8% on 01/03/2015.      Finally, patient has history of Vitamin D Deficiency of 11 in 2012 and last Vitamin D was still low at 48 on 09/16/2014.      Medication Sig  . allopurinol 100 MG  TAKE 1 TABLET (100 MG TOTAL) BY MOUTH DAILY.  Marland Kitchen aspirin EC 81 MG EC Take 1 tablet (81 mg total) by mouth  daily.  . B Complex Vitamins  Take 1 tablet by mouth daily.   . carvedilol  6.25 MG  Take 1 tablet (6.25 mg total) by mouth 2 (two) times daily with a meal.  . VITAMIN D 2000 UNITS  Take 2,000 Units by mouth daily.  . furosemide  40 MG  Take 40 mg by mouth daily.  Marland Kitchen gabapentin  300 MG  TAKE 1 CAPSULE BY MOUTH IN AM, THEN 1 TO 2 CAPSULES BY MOUTH AT BEDTIME   . levothyroxine  50 MCG  TAKE 1/2 TABLET BY MOUTH DAILY EXCEPT TAKE 1 TABLET ON TUESDAY AND THURSDAY  . Tumeric Take 2 tablets by mouth daily.   . Probiotic Product  Take 1 tablet by mouth daily.   . sertraline (ZOLOFT) 100 MG  Take 1 tablet (100 mg total) by mouth daily.  Marland Kitchen losartan  25 MG TAKE 1 TABLET (25 MG TOTAL) BY MOUTH DAILY.  Marland Kitchen MAGNESIUM  Take 1 tablet by mouth daily.   . Menaquinone-7 (VITAMIN K2 PO) Take 50 mcg by mouth every other day.   Allergies  Allergen Reactions  . Ace Inhibitors   . Codeine     dysuria  . Lisinopril Swelling  . Morphine And Related     dysuria  . Percocet [Oxycodone-Acetaminophen]     dysuria  . Vitamin C     dysuria  . Vitamin D Analogs     Large doses cause dysuria   Past Medical History  Diagnosis Date  . Hyperlipidemia   . Hypertension   . Type II or unspecified type diabetes mellitus without mention of complication, not stated as uncontrolled   . Diabetic neuropathy (HCC)   . Chronic kidney disease   . GERD (gastroesophageal reflux disease)   . Osteoporosis   . CHF (congestive heart failure) (HCC)   . Cardiomyopathy [facility](HCC)    Health Maintenance  Topic Date Due  . OPHTHALMOLOGY EXAM  11/25/1933  . ZOSTAVAX  11/26/1983  . PNA vac Low Risk Adult (2 of 2 - PCV13) 02/23/2014  . URINE MICROALBUMIN  03/01/2015  . INFLUENZA VACCINE  03/06/2015  . FOOT EXAM  06/11/2015  . HEMOGLOBIN A1C  07/05/2015  . TETANUS/TDAP  02/24/2023  . DEXA SCAN  Completed   Immunization History  Administered Date(s) Administered  . Influenza Split 05/06/2013  . Pneumococcal Polysaccharide-23  02/23/2013  . Td 02/23/2013   Past Surgical History  Procedure Laterality Date  . Nephrectomy Right 1959  . Breast lumpectomy      [id number],1992,2005  . Bladder surgery  1984  . Colostomy  2004    Reversed 2005  . Eye surgery  2008 and 2009   Family History  Problem Relation Age of Onset  . Heart disease Father   . Heart disease Sister   . Diabetes [name]   . Hypertension Son    Social History  Substance Use Topics  . Smoking status: Never Smoker   . Smokeless tobacco: None  . Alcohol Use: Yes     Comment: Occ wine states she is going to stop    ROS Constitutional: Denies fever, chills, weight loss/gain, headaches, insomnia,  night sweats, and change in appetite. Does c/o fatigue. Eyes: Denies redness, blurred vision, diplopia, discharge, itchy, watery eyes.  ENT: Denies discharge, congestion, post nasal drip, epistaxis, sore throat, earache, hearing loss, dental pain, Tinnitus, Vertigo, Sinus pain, snoring.  Cardio: Denies chest pain, palpitations, irregular heartbeat, syncope, dyspnea, diaphoresis, orthopnea, PND, claudication, edema Respiratory: denies cough, dyspnea, DOE, pleurisy, hoarseness, laryngitis, wheezing.  Gastrointestinal: Denies dysphagia, heartburn, reflux, water brash, pain, cramps, nausea, vomiting, bloating, diarrhea, constipation, hematemesis, melena, hematochezia, jaundice, hemorrhoids Genitourinary: Denies dysuria, frequency, urgency, nocturia, hesitancy, discharge, hematuria, flank pain Breast: Breast lumps, nipple discharge, bleeding.  Musculoskeletal: Denies arthralgia, myalgia, stiffness, Jt. Swelling, pain, limp, and strain/sprain. Denies falls. Skin: Denies puritis, rash, hives, warts, acne, eczema, changing in skin lesion Neuro: No weakness, tremor, incoordination, spasms, paresthesia, pain Psychiatric: Hallucinations as above .  Denies Depression. Endocrine: Denies change in weight, skin, hair change, nocturia, and paresthesia, diabetic polys,  visual blurring, hyper / hypo glycemic episodes.  Heme/Lymph: No excessive bleeding, bruising, enlarged lymph nodes.  Physical Exam  BP 128/62 mmHg  Pulse 60  Temp(Src) 97.9 F (36.6 C)  Resp 16  Ht 5\' 1"  (1.549 m)  Wt 160 lb 9.6 oz (72.848 kg)  BMI 30.36 kg/m2  General Appearance: Over nourished with central obesity and in no apparent distress. Eyes: PERRLA, EOMs, conjunctiva no swelling or erythema, normal fundi and vessels. Sinuses: No frontal/maxillary tenderness ENT/Mouth: EACs patent / TMs  nl. Nares clear without erythema, swelling, mucoid exudates. Oral hygiene is good. No erythema, swelling, or exudate. Tongue normal, non-obstructing. Tonsils not swollen or erythematous. Hearing normal.  Neck: Supple, thyroid normal. No bruits, nodes or JVD. Respiratory: Respiratory effort normal.  BS equal and clear bilateral without rales, rhonci, wheezing or stridor. Cardio: Heart sounds are normal with regular rate and rhythm and no murmurs, rubs or gallops. Peripheral  pulses are normal and equal bilaterally without edema. No aortic or femoral bruits. Chest: Moderate gibbous deformity & increased AP diameter. Symmetric with normal excursions and percussion. Breasts: Symmetric, atrophic without lumps, nipple discharge, retractions, or fibrocystic changes.  Abdomen: Rotund, soft, with bowel sounds. Nontender, no guarding, rebound, hernias, masses, or organomegaly.  Lymphatics: Non tender without lymphadenopathy.  Musculoskeletal: Generalized decrease in muscle power, tone & bulk consistent with age and deconditioning. Walks with a rolling walker or uses an electric scooter on excursions.  Skin: Warm and dry without rashes, lesions, cyanosis, clubbing or  ecchymosis.  Neuro: Cranial nerves intact, DTR's ar flat to absent thru-out. No cerebellar symptoms. Sensation intact by monofilament testing and pressure to the toes bilaterally.  Pysch: Alert and seems easily confused, but oriented x 1-2,  flat indifferent affect; Insight and Judgment limited.  Short term recall is very poor.  Responses usually contradicted by [name].   Assessment and Plan  1. Essential hypertension  - EKG 12-Lead - [place], RETROPERITNL ABD,  LTD - TSH  2. Hyperlipidemia  - Lipid panel  3. Controlled type 2 diabetes mellitus with diabetic nephropathy, without long-term current use of insulin (HCC)  - Microalbumin / creatinine urine ratio - Hemoglobin A1c - Insulin, random  4. CKD stage 3 due to type 2 diabetes mellitus (HCC)  - Vit D  25 hydroxy (rtn osteoporosis monitoring)  5. Vitamin D deficiency   6. Depression, controlled   7. Depression screen   8. Idiopathic gout, unspecified chronicity, unspecified site  - Uric acid  9. Diabetic polyneuropathy associated with diabetes mellitus due to underlying condition (HCC)  - HM DIABETES FOOT EXAM - LOW EXTREMITY NEUR EXAM DOCUM  10. Cardiomyopathy (HCC)   11. Osteoporosis   12. Screening for rectal cancer  - POC Hemoccult Bld/Stl (3-Cd Home Screen); Future  13. Medication management  - Urinalysis, Routine w reflex microscopic (not at [facility]) - CBC with Differential/Platelet - BASIC METABOLIC PANEL WITH GFR - Hepatic function panel - Magnesium  14. Need for prophylactic vaccination and inoculation against influenza  - Flu vaccine HIGH DOSE PF (Fluzone High dose)  15. Recent Hallucinations   - Advised [name] to cut Sertraline dose in 1/2  And if hallucinations persist then decrease to 1/4 tab for 2 weeks , then d/c and will try another med to help with the hallucinations.  - [name] was advised that the hallucinations most likely due to vascular Dementia, but that Sertralineis the medicine that she's taking that's most likely to cause hallucinations in someone who's predisposed.     Continue prudent diet as discussed, weight control, BP monitoring, regular exercise, and medications. Discussed med's effects and SE's.  Screening labs and tests as requested with regular follow-up as recommended.  Over 40 minutes of exam, counseling, chart review was performed.

## 2015-05-17 NOTE — Patient Instructions (Signed)

## 2015-05-18 ENCOUNTER — Encounter: Payer: Self-pay | Admitting: Podiatry

## 2015-05-18 ENCOUNTER — Other Ambulatory Visit: Payer: Self-pay | Admitting: Internal Medicine

## 2015-05-18 ENCOUNTER — Ambulatory Visit (INDEPENDENT_AMBULATORY_CARE_PROVIDER_SITE_OTHER): Payer: Medicare Other | Admitting: Podiatry

## 2015-05-18 DIAGNOSIS — N3090 Cystitis, unspecified without hematuria: Secondary | ICD-10-CM

## 2015-05-18 DIAGNOSIS — M79676 Pain in unspecified toe(s): Secondary | ICD-10-CM

## 2015-05-18 DIAGNOSIS — B351 Tinea unguium: Secondary | ICD-10-CM

## 2015-05-18 LAB — INSULIN, RANDOM: INSULIN: 2.4 u[IU]/mL (ref 2.0–19.6)

## 2015-05-18 LAB — URINALYSIS, MICROSCOPIC ONLY
CASTS: NONE SEEN [LPF]
Crystals: NONE SEEN [HPF]
RBC / HPF: NONE SEEN RBC/HPF (ref <=2)
Yeast: NONE SEEN [HPF]

## 2015-05-18 LAB — MICROALBUMIN / CREATININE URINE RATIO
Creatinine, Urine: 88.5 mg/dL
MICROALB/CREAT RATIO: 78 mg/g — AB (ref 0.0–30.0)
Microalb, Ur: 6.9 mg/dL — ABNORMAL HIGH (ref <2.0)

## 2015-05-18 LAB — URINALYSIS, ROUTINE W REFLEX MICROSCOPIC
Bilirubin Urine: NEGATIVE
Glucose, UA: NEGATIVE
KETONES UR: NEGATIVE
Nitrite: NEGATIVE
SPECIFIC GRAVITY, URINE: 1.013 (ref 1.001–1.035)
pH: 6 (ref 5.0–8.0)

## 2015-05-18 LAB — HEMOGLOBIN A1C
Hgb A1c MFr Bld: 6.5 % — ABNORMAL HIGH (ref <5.7)
Mean Plasma Glucose: 140 mg/dL — ABNORMAL HIGH (ref <117)

## 2015-05-18 LAB — VITAMIN D 25 HYDROXY (VIT D DEFICIENCY, FRACTURES): Vit D, 25-Hydroxy: 51 ng/mL (ref 30–100)

## 2015-05-18 LAB — TSH: TSH: 1.996 u[IU]/mL (ref 0.350–4.500)

## 2015-05-18 MED ORDER — CIPROFLOXACIN HCL 250 MG PO TABS: 250.0000 mg | ORAL_TABLET | Freq: Two times a day (BID) | ORAL | Status: DC

## 2015-05-18 NOTE — Progress Notes (Signed)
Subjective:     Patient ID: Loria H Earnhart, female   DOB: 04/13/1922, 81 y.o.   MRN: [id number]  HPIThis patient presents to the office for preventive foot care services.  Patient is diabetic with significant swelling of lower leg and ankle because of venous stasis B/L.She is diabetic with vascular complications.   Review of Systems     Objective:   Physical Exam Objective: Review of past medical history, medications, social history and allergies were performed.  Vascular: Dorsalis pedis  were palpable B/L  Her posterior tibial pulses were not palpable due to her swelling. capillary refill was  WNL B/L, temperature gradient was WNL B/L   Skin:  No signs of symptoms of infection or ulcers on both feet  Nails: Thick disfigured discolored nails with no evidence of infection.  Sensory: Semmes Weinstein monifilament WNL   Orthopedic: Orthopedic evaluation demonstrates all joints distal t ankle have full ROM without crepitus, muscle power WNL B/L    Assessment:     Onychomycosis     Plan:     Debridement of nails both feet       

## 2015-05-20 ENCOUNTER — Other Ambulatory Visit: Payer: Self-pay | Admitting: Internal Medicine

## 2015-05-20 LAB — URINE CULTURE: Colony Count: >=100000

## 2015-05-28 ENCOUNTER — Other Ambulatory Visit: Payer: Self-pay | Admitting: Internal Medicine

## 2015-06-17 ENCOUNTER — Other Ambulatory Visit: Payer: Self-pay | Admitting: Internal Medicine

## 2015-06-19 ENCOUNTER — Ambulatory Visit (INDEPENDENT_AMBULATORY_CARE_PROVIDER_SITE_OTHER): Payer: Medicare Other | Admitting: *Deleted

## 2015-06-19 DIAGNOSIS — N390 Urinary tract infection, site not specified: Secondary | ICD-10-CM

## 2015-06-19 NOTE — Progress Notes (Signed)
Patient ID: [name], female   DOB: 1922/04/11, 81 y.o.   MRN: [id number] Patient presents for 1 month recheck UA, C&S per Dr. Kathryne SharperMcKeown's orders.

## 2015-06-20 LAB — URINALYSIS, ROUTINE W REFLEX MICROSCOPIC
Bilirubin Urine: NEGATIVE
Glucose, UA: NEGATIVE
KETONES UR: NEGATIVE
NITRITE: POSITIVE — AB
PH: 5.5 (ref 5.0–8.0)
Protein, ur: NEGATIVE
Specific Gravity, Urine: 1.014 (ref 1.001–1.035)

## 2015-06-20 LAB — URINALYSIS, MICROSCOPIC ONLY
CRYSTALS: NONE SEEN [HPF]
Casts: NONE SEEN [LPF]
YEAST: NONE SEEN [HPF]

## 2015-06-22 ENCOUNTER — Other Ambulatory Visit: Payer: Self-pay | Admitting: Internal Medicine

## 2015-06-22 DIAGNOSIS — N3090 Cystitis, unspecified without hematuria: Secondary | ICD-10-CM

## 2015-06-22 LAB — URINE CULTURE

## 2015-06-23 ENCOUNTER — Other Ambulatory Visit: Payer: Self-pay | Admitting: Internal Medicine

## 2015-06-23 ENCOUNTER — Telehealth: Payer: Self-pay | Admitting: Physician Assistant

## 2015-06-23 MED ORDER — QUETIAPINE FUMARATE 25 MG PO TABS: ORAL_TABLET | ORAL | Status: DC

## 2015-06-23 MED ORDER — NITROFURANTOIN MONOHYD MACRO 100 MG PO CAPS: ORAL_CAPSULE | ORAL | Status: AC

## 2015-06-23 NOTE — Telephone Encounter (Signed)
Please do 1/2 of the zoloft every other day for 7 days.  Start 1/2 of the seroquel at night 30 mins before bed for 7 days, can increase to 1 pill if she is doing well on it for 2 weeks. Message or call at that point.

## 2015-06-27 ENCOUNTER — Encounter: Payer: Self-pay | Admitting: Physician Assistant

## 2015-07-03 ENCOUNTER — Emergency Department (INDEPENDENT_AMBULATORY_CARE_PROVIDER_SITE_OTHER)
Admission: EM | Admit: 2015-07-03 | Discharge: 2015-07-03 | Disposition: A | Discharge status: Home / Self Care | Payer: Medicare Other | Source: Home / Self Care | Attending: Family Medicine | Admitting: Family Medicine

## 2015-07-03 ENCOUNTER — Encounter (HOSPITAL_COMMUNITY): Payer: Self-pay | Admitting: Emergency Medicine

## 2015-07-03 ENCOUNTER — Ambulatory Visit: Payer: Self-pay

## 2015-07-03 ENCOUNTER — Emergency Department (INDEPENDENT_AMBULATORY_CARE_PROVIDER_SITE_OTHER): Payer: Medicare Other

## 2015-07-03 DIAGNOSIS — S40011A Contusion of right shoulder, initial encounter: Secondary | ICD-10-CM

## 2015-07-03 DIAGNOSIS — S40021A Contusion of right upper arm, initial encounter: Secondary | ICD-10-CM

## 2015-07-03 DIAGNOSIS — IMO0001 Reserved for inherently not codable concepts without codable children: Secondary | ICD-10-CM

## 2015-07-03 NOTE — ED Provider Notes (Signed)
CSN: [id number]     Arrival date & time 07/03/15  1633 History   None    Chief Complaint  Patient presents with  . Fall   (Consider location/radiation/quality/duration/timing/severity/associated sxs/prior Treatment) Patient is a 81 y.o. female presenting with arm injury. The history is provided by the patient and a relative.  Arm Injury Location:  Arm Time since incident:  1 week Injury: yes   Mechanism of injury: fall   Fall:    Fall occurred:  Standing and tripped   Impact surface:  Carpet   Entrapped after fall: no   Arm location:  R arm Pain details:    Severity:  Moderate   Progression:  Partially resolved Chronicity:  New Dislocation: no   Tetanus status:  Unknown Prior injury to area:  No Associated symptoms: no fever   Associated symptoms comment:  Bruising and min soreness to right leg.   Past Medical History  Diagnosis Date  . Hyperlipidemia   . Hypertension   . Type II or unspecified type diabetes mellitus without mention of complication, not stated as uncontrolled   . Diabetic neuropathy (HCC)   . Chronic kidney disease   . GERD (gastroesophageal reflux disease)   . Osteoporosis   . CHF (congestive heart failure) (HCC)   . Cardiomyopathy [facility](HCC)    Past Surgical History  Procedure Laterality Date  . Nephrectomy Right 1959  . Breast lumpectomy      [id number],1992,2005  . Bladder surgery  1984  . Colostomy  2004    Reversed 2005  . Eye surgery  2008 and 2009   Family History  Problem Relation Age of Onset  . Heart disease Father   . Heart disease Sister   . Diabetes Daughter   . Hypertension Son    Social History  Substance Use Topics  . Smoking status: Never Smoker   . Smokeless tobacco: None  . Alcohol Use: Yes     Comment: Occ wine states she is going to stop   OB History    No data available     Review of Systems  Constitutional: Negative for fever.  Musculoskeletal: Positive for myalgias. Negative for gait problem.  Skin: Positive for  color change and rash.  All other systems reviewed and are negative.   Allergies  Ace inhibitors; Codeine; Lisinopril; Morphine and related; Percocet; Vitamin c; and Vitamin d analogs  Home Medications   Prior to Admission medications   Medication Sig Start Date End Date Taking? Authorizing Provider  allopurinol (ZYLOPRIM) 100 MG tablet TAKE 1 TABLET (100 MG TOTAL) BY MOUTH DAILY. 06/28/14   Lucky CowboyWilliam McKeown, MD  aspirin EC 81 MG EC tablet Take 1 tablet (81 mg total) by mouth daily. 11/07/13   Marinda ElkAbraham Feliz Ortiz, MD  B Complex Vitamins (VITAMIN-B COMPLEX PO) Take 1 tablet by mouth daily.     Historical Provider, MD  carvedilol (COREG) 6.25 MG tablet Take 1 tablet (6.25 mg total) by mouth 2 (two) times daily with a meal. 12/09/14   Lewayne BuntingBrian S Crenshaw, MD  Cholecalciferol (VITAMIN D) 2000 UNITS CAPS Take 2,000 Units by mouth daily.    Historical Provider, MD  ciprofloxacin (CIPRO) 250 MG tablet Take 1 tablet (250 mg total) by mouth 2 (two) times daily. Take 1 tablet 2 x day with food for infection 05/18/15   Lucky CowboyWilliam McKeown, MD  furosemide (LASIX) 40 MG tablet Take 40 mg by mouth daily. 11/07/13   Marinda ElkAbraham Feliz Ortiz, MD  gabapentin (NEURONTIN) 300 MG capsule TAKE 1  CAPSULE BY MOUTH IN AM, THEN 1 TO 2 CAPSULES BY MOUTH AT BEDTIME FOR LEG PAINS 01/17/15   Quentin Mulling, PA-C  levothyroxine (SYNTHROID, LEVOTHROID) 50 MCG tablet TAKE 1/2 TABLET BY MOUTH DAILY EXCEPT TAKE 1 TABLET ON TUESDAY AND THURSDAY 06/17/15   Lucky Cowboy, MD  nitrofurantoin, macrocrystal-monohydrate, (MACROBID) 100 MG capsule Take 1 capsule 2 x day with food for 1 week, then take 1 capsule at bedtime for 3 weeks. 06/23/15 07/23/15  Lucky Cowboy, MD  OVER THE COUNTER MEDICATION Take 2 tablets by mouth daily. Tumeric    Historical Provider, MD  Probiotic Product (PROBIOTIC DAILY PO) Take 1 tablet by mouth daily.     Historical Provider, MD  QUEtiapine (SEROQUEL) 25 MG tablet 1/2-2 pills at night 06/23/15   Quentin Mulling, PA-C    sertraline (ZOLOFT) 100 MG tablet TAKE 1 TABLET (100 MG TOTAL) BY MOUTH DAILY. 05/29/15   Lucky Cowboy, MD   Meds Ordered and Administered this Visit  Medications - No data to display  BP 169/84 mmHg  Pulse 69  Temp(Src) 98.3 F (36.8 C) (Oral)  Resp 16  SpO2 98% No data found.   Physical Exam  Constitutional: She appears well-developed and well-nourished. No distress.  HENT:  Head: Normocephalic and atraumatic.  Neck: Normal range of motion. Neck supple.  Pulmonary/Chest: She exhibits no tenderness.  Musculoskeletal: She exhibits tenderness.  Neurological: She is alert.  Skin: Skin is warm and dry. Abrasion and ecchymosis noted.     Nursing note and vitals reviewed.   ED Course  Procedures (including critical care time)  Labs Review Labs Reviewed - No data to display  Imaging Review Dg Shoulder 1v Right  07/03/2015  CLINICAL DATA:  Status post fall over a week ago. EXAM: RIGHT SHOULDER - 1 VIEW COMPARISON:  None. FINDINGS: There is no fracture or dislocation. There is loss of the normal acromiohumeral distance as can be seen with a rotator cuff tear. There are mild degenerative changes of the acromioclavicular joint. IMPRESSION: No acute osseous injury of the right shoulder on a single view. Electronically Signed   By: Elige Ko   On: 07/03/2015 18:52   Dg Humerus Right  07/03/2015  CLINICAL DATA:  Pain following fall EXAM: RIGHT HUMERUS - 2+ VIEW COMPARISON:  None. FINDINGS: Frontal and lateral views were obtained. There is no acute fracture or dislocation. There is moderate generalized osteoarthritic change in the right shoulder. There are scattered calcified phleboliths in the distal upper arm region. IMPRESSION: Osteoarthritic change right shoulder.  No fracture or dislocation. Electronically Signed   By: Bretta Bang III M.D.   On: 07/03/2015 18:51   X-rays reviewed and report per radiologist.   Visual Acuity Review  Right Eye Distance:   Left Eye  Distance:   Bilateral Distance:    Right Eye Near:   Left Eye Near:    Bilateral Near:         MDM   1. Contusion shoulder/arm, right, initial encounter        Linna Hoff, MD 07/03/15 [phone]

## 2015-07-03 NOTE — Discharge Instructions (Signed)
Wash as needed, continue your home care. Return as needed.

## 2015-07-03 NOTE — ED Notes (Signed)
Fall11/19.  Skin tear to right upper arm and bruising to right upper extremity.

## 2015-07-16 ENCOUNTER — Encounter: Payer: Self-pay | Admitting: Physician Assistant

## 2015-07-17 ENCOUNTER — Ambulatory Visit (INDEPENDENT_AMBULATORY_CARE_PROVIDER_SITE_OTHER): Payer: Medicare Other

## 2015-07-17 ENCOUNTER — Other Ambulatory Visit: Payer: Self-pay | Admitting: Internal Medicine

## 2015-07-17 DIAGNOSIS — N390 Urinary tract infection, site not specified: Secondary | ICD-10-CM | POA: Diagnosis not present

## 2015-07-17 NOTE — Addendum Note (Signed)
Addended by: Tari Lecount A on: 07/17/2015 10:59 AM   Modules accepted: Level of Service

## 2015-07-17 NOTE — Progress Notes (Signed)
Pt presents for UA & UC. Pt's daughter states that she will be finished with ABX on Thursday.

## 2015-07-17 NOTE — Addendum Note (Signed)
Addended by: Rodney BoozeUFF, Virlan Kempker D on: 07/17/2015 10:38 AM   Modules accepted: Orders

## 2015-07-18 LAB — URINALYSIS, MICROSCOPIC ONLY
CRYSTALS: NONE SEEN [HPF]
Casts: NONE SEEN [LPF]
RBC / HPF: NONE SEEN RBC/HPF (ref <=2)
Yeast: NONE SEEN [HPF]

## 2015-07-18 LAB — URINALYSIS, ROUTINE W REFLEX MICROSCOPIC
Bilirubin Urine: NEGATIVE
GLUCOSE, UA: NEGATIVE
HGB URINE DIPSTICK: NEGATIVE
KETONES UR: NEGATIVE
Nitrite: NEGATIVE
PH: 6.5 (ref 5.0–8.0)
Protein, ur: NEGATIVE
Specific Gravity, Urine: 1.012 (ref 1.001–1.035)

## 2015-07-19 LAB — URINE CULTURE
Colony Count: NO GROWTH
Organism ID, Bacteria: NO GROWTH

## 2015-07-27 ENCOUNTER — Ambulatory Visit (INDEPENDENT_AMBULATORY_CARE_PROVIDER_SITE_OTHER): Payer: Medicare Other | Admitting: Physician Assistant

## 2015-07-27 ENCOUNTER — Encounter: Payer: Self-pay | Admitting: Physician Assistant

## 2015-07-27 VITALS — BP 136/60 | HR 63 | Temp 97.7°F | Resp 14 | Ht 61.0 in | Wt 158.0 lb

## 2015-07-27 DIAGNOSIS — R2689 Other abnormalities of gait and mobility: Secondary | ICD-10-CM | POA: Diagnosis not present

## 2015-07-27 DIAGNOSIS — E785 Hyperlipidemia, unspecified: Secondary | ICD-10-CM

## 2015-07-27 DIAGNOSIS — R1904 Left lower quadrant abdominal swelling, mass and lump: Secondary | ICD-10-CM | POA: Diagnosis not present

## 2015-07-27 DIAGNOSIS — I10 Essential (primary) hypertension: Secondary | ICD-10-CM

## 2015-07-27 DIAGNOSIS — F0390 Unspecified dementia without behavioral disturbance: Secondary | ICD-10-CM | POA: Diagnosis not present

## 2015-07-27 DIAGNOSIS — E1121 Type 2 diabetes mellitus with diabetic nephropathy: Secondary | ICD-10-CM

## 2015-07-27 LAB — BASIC METABOLIC PANEL WITH GFR
BUN: 24 mg/dL (ref 7–25)
CO2: 34 mmol/L — ABNORMAL HIGH (ref 20–31)
CREATININE: 1.53 mg/dL — AB (ref 0.60–0.88)
Calcium: 8.7 mg/dL (ref 8.6–10.4)
Chloride: 97 mmol/L — ABNORMAL LOW (ref 98–110)
GFR, EST AFRICAN AMERICAN: 34 mL/min — AB (ref >=60)
GFR, Est Non African American: 30 mL/min — ABNORMAL LOW (ref >=60)
GLUCOSE: 147 mg/dL — AB (ref 65–99)
POTASSIUM: 3.6 mmol/L (ref 3.5–5.3)
Sodium: 140 mmol/L (ref 135–146)

## 2015-07-27 LAB — CBC WITH DIFFERENTIAL/PLATELET
BASOS ABS: 0.1 10*3/uL (ref 0.0–0.1)
Basophils Relative: 1 % (ref 0–1)
EOS ABS: 0.4 10*3/uL (ref 0.0–0.7)
EOS PCT: 6 % — AB (ref 0–5)
HEMATOCRIT: 38.6 % (ref 36.0–46.0)
Hemoglobin: 12.6 g/dL (ref 12.0–15.0)
LYMPHS ABS: 1.2 10*3/uL (ref 0.7–4.0)
LYMPHS PCT: 19 % (ref 12–46)
MCH: 32 pg (ref 26.0–34.0)
MCHC: 32.6 g/dL (ref 30.0–36.0)
MCV: 98 fL (ref 78.0–100.0)
MONO ABS: 0.3 10*3/uL (ref 0.1–1.0)
MONOS PCT: 5 % (ref 3–12)
MPV: 10.7 fL (ref 8.6–12.4)
Neutro Abs: 4.3 10*3/uL (ref 1.7–7.7)
Neutrophils Relative %: 69 % (ref 43–77)
PLATELETS: 247 10*3/uL (ref 150–400)
RBC: 3.94 MIL/uL (ref 3.87–5.11)
RDW: 14.9 % (ref 11.5–15.5)
WBC: 6.2 10*3/uL (ref 4.0–10.5)

## 2015-07-27 LAB — HEPATIC FUNCTION PANEL
ALK PHOS: 49 U/L (ref 33–130)
ALT: 4 U/L — ABNORMAL LOW (ref 6–29)
AST: 10 U/L (ref 10–35)
Albumin: 3.5 g/dL — ABNORMAL LOW (ref 3.6–5.1)
BILIRUBIN TOTAL: 0.6 mg/dL (ref 0.2–1.2)
Bilirubin, Direct: 0.1 mg/dL (ref <=0.2)
Indirect Bilirubin: 0.5 mg/dL (ref 0.2–1.2)
TOTAL PROTEIN: 6 g/dL — AB (ref 6.1–8.1)

## 2015-07-27 LAB — TSH: TSH: 1.296 u[IU]/mL (ref 0.350–4.500)

## 2015-07-27 NOTE — Patient Instructions (Signed)
Can try coreg 1/2 in the AM and 1/2 at pm If she has swelling or shortness of breath change it to once a day  If she has nausea with vomiting or does not have a bowel movement for several days with a painful AB, take her to the ER.   Please monitor your blood pressure, as we get older our body can not respond to a low blood pressure as well as it did when we were younger, for this reason we want a bit higher of a blood pressure as you get older to avoid dizziness and fatigue which can lead to falls. Pease call if your blood pressure is consistently above 150/90.

## 2015-07-27 NOTE — Progress Notes (Addendum)
Subjective:    Patient ID: [name], female    DOB: February 21, 1922, 81 y.o.   MRN: [id number]  HPI 81 y.o. WF presents with her daughter for constipation and evaluation for wheelchair and discuss long term facility. .   She also states that over the weekend that her mother was unresponsive on the toleiet over the weekend, she called the ambulance but while on the phone with 911 she came around and decided to not take her. She had more BM's at that time, 1-2 days prior to this she had contacted Dr. Oneta Rack about a hard AB, it was non tender at the time so she was given a laxative, she did a suppository that helped with BM. She is not eating or drinking much, but no vomiting, no black stool. She is on lasix once daily, has some left leg swelling.  She is at her daughter's house and would like a wheel chair. She is unable to walk at all due to weakness, her legs will give out on her and she will fall. She states she can use the walker for short distances in the house but for long distances like like for going to doctors, hair appointment. She is not having any hallucinations but still having sun downing, she is on seroquel at night, will wake up at 11pm and not want to go back to sleep. She states she will forget which room she is in and where she is it, this has improved some since being off the zoloft. She feels that she may need a memory care facility, due to the demenita, sun downing, issues with knowing where she is, she will wonder occ due to confusion, and issues with ADLs like dressing her self, will shower herself but her daughter will need to dry her and dress her, can feed herself. + incontinence, fecal and urine.   Blood pressure 136/60, pulse 63, temperature 97.7 F (36.5 C), temperature source Temporal, resp. rate 14, height  (1.549 m), weight 158 lb (71.668 kg), SpO2 90 %.  Lab Results  Component Value Date   CREATININE 1.37* 05/17/2015   BUN 21 05/17/2015   NA 140  05/17/2015   K 3.7 05/17/2015   CL 98 05/17/2015   CO2 28 05/17/2015   Past Medical History  Diagnosis Date  . Hyperlipidemia   . Hypertension   . Type II or unspecified type diabetes mellitus without mention of complication, not stated as uncontrolled   . Diabetic neuropathy (HCC)   . Chronic kidney disease   . GERD (gastroesophageal reflux disease)   . Osteoporosis   . CHF (congestive heart failure) (HCC)   . Cardiomyopathy [facility])    Current Outpatient Prescriptions on File Prior to Visit  Medication Sig Dispense Refill  . allopurinol (ZYLOPRIM) 100 MG tablet TAKE 1 TABLET (100 MG TOTAL) BY MOUTH DAILY. 90 tablet 1  . aspirin EC 81 MG EC tablet Take 1 tablet (81 mg total) by mouth daily.    . B Complex Vitamins (VITAMIN-B COMPLEX PO) Take 1 tablet by mouth daily.     . carvedilol (COREG) 6.25 MG tablet Take 1 tablet (6.25 mg total) by mouth 2 (two) times daily with a meal. 60 tablet 11  . Cholecalciferol (VITAMIN D) 2000 UNITS CAPS Take 2,000 Units by mouth daily.    . furosemide (LASIX) 40 MG tablet Take 40 mg by mouth daily.    Marland Kitchen gabapentin (NEURONTIN) 300 MG capsule TAKE 1 CAPSULE BY MOUTH IN AM,  THEN 1 TO 2 CAPSULES BY MOUTH AT BEDTIME FOR LEG PAINS 90 capsule 5  . levothyroxine (SYNTHROID, LEVOTHROID) 50 MCG tablet TAKE 1/2 TABLET BY MOUTH DAILY EXCEPT TAKE 1 TABLET ON TUESDAY AND THURSDAY 90 tablet 1  . OVER THE COUNTER MEDICATION Take 2 tablets by mouth daily. Tumeric    . Probiotic Product (PROBIOTIC DAILY PO) Take 1 tablet by mouth daily.     . QUEtiapine (SEROQUEL) 25 MG tablet 1/2-2 pills at night 60 tablet 2   No current facility-administered medications on file prior to visit.    Review of Systems  Constitutional: Positive for appetite change and fatigue. Negative for fever, chills, diaphoresis, activity change and unexpected weight change.  HENT: Negative.   Respiratory: Negative for apnea, cough, choking, chest tightness, shortness of breath, wheezing and stridor.    Cardiovascular: Positive for leg swelling. Negative for chest pain and palpitations.  Gastrointestinal: Positive for abdominal pain, constipation and abdominal distention. Negative for nausea, vomiting, diarrhea, blood in stool, anal bleeding and rectal pain.  Genitourinary: Positive for urgency and frequency.  Musculoskeletal: Positive for myalgias, back pain, arthralgias and gait problem. Negative for joint swelling, neck pain and neck stiffness.  Skin: Negative.  Negative for rash.  Neurological: Negative.  Negative for seizures, syncope, facial asymmetry and headaches.  Psychiatric/Behavioral: Positive for confusion and decreased concentration. Negative for suicidal ideas, hallucinations, behavioral problems, self-injury, dysphoric mood and agitation. The patient is not nervous/anxious.        Objective:   Physical Exam  Constitutional: She appears well-developed and well-nourished.  HENT:  Head: Normocephalic and atraumatic.  Eyes: Conjunctivae are normal. Pupils are equal, round, and reactive to light.  Neck: Normal range of motion. Neck supple.  Cardiovascular: Normal rate.   Pulmonary/Chest: Effort normal.  Abdominal: Soft. Bowel sounds are normal. She exhibits distension and mass (versus hernia left lower quadrant). There is tenderness in the left lower quadrant. There is guarding. There is no rigidity, no rebound, no tenderness at McBurney's point and negative Murphy's sign.  Musculoskeletal: Normal range of motion. She exhibits edema (1+ right leg and 2+ edema in her left leg with some erythema, no weeping, warmth).  Patient in wheelchair, diffuse weakness upper and lower extremities  Lymphadenopathy:    She has no cervical adenopathy.  Neurological: She is alert. No cranial nerve deficit.  Decreased sensation bilateral feet to her mid shin, no ulcers Oriented x 2  Skin: Skin is warm and dry.  Psychiatric: She has a normal mood and affect. Judgment and thought content  normal.       Assessment & Plan:  AB pain- + mass versus hernia- Daughter declines US at this time, understands risk of mass/cancer but at this time due to age and minimal pain declines a study Will do water, fiber, another suppository tonight If worsening N,V, no BM/gas will go to ER  Fatigue- check labs Decrease coreg to 1/2 pill BID, monitor weight/breathing  Dementia- discussed going to memory care facility.  Will get wheel chair RX. Continue seroquel  Patient suffers from dementia, imbalance, and weakness which impairs their ability to perform daily activities like toileting, feeding, dressing, grooming, and bathing in the home. A cane, walker, crutch will not resolve issue with performing activities of daily living. Patient is a High Fall Risk. A wheelchair will allow patient to safely perform daily activities.  Patient requires a set dimension for their house which is not available in a standard or light weight wheelchair and patient spends at  least 2 hours per day in the chair.

## 2015-07-28 ENCOUNTER — Encounter: Payer: Self-pay | Admitting: Physician Assistant

## 2015-07-30 ENCOUNTER — Encounter (HOSPITAL_COMMUNITY): Payer: Self-pay | Admitting: Radiology

## 2015-07-30 ENCOUNTER — Emergency Department (HOSPITAL_COMMUNITY): Payer: Medicare Other

## 2015-07-30 ENCOUNTER — Inpatient Hospital Stay (HOSPITAL_COMMUNITY)
Admission: EM | Admit: 2015-07-30 | Discharge: 2015-08-03 | DRG: 871 | Disposition: A | Discharge status: Hospice | Payer: Medicare Other | Attending: Family Medicine | Admitting: Family Medicine

## 2015-07-30 DIAGNOSIS — M109 Gout, unspecified: Secondary | ICD-10-CM | POA: Diagnosis present

## 2015-07-30 DIAGNOSIS — A419 Sepsis, unspecified organism: Principal | ICD-10-CM | POA: Diagnosis present

## 2015-07-30 DIAGNOSIS — E039 Hypothyroidism, unspecified: Secondary | ICD-10-CM | POA: Diagnosis present

## 2015-07-30 DIAGNOSIS — Z8249 Family history of ischemic heart disease and other diseases of the circulatory system: Secondary | ICD-10-CM

## 2015-07-30 DIAGNOSIS — Z66 Do not resuscitate: Secondary | ICD-10-CM | POA: Diagnosis present

## 2015-07-30 DIAGNOSIS — B9620 Unspecified Escherichia coli [E. coli] as the cause of diseases classified elsewhere: Secondary | ICD-10-CM | POA: Diagnosis present

## 2015-07-30 DIAGNOSIS — Z8673 Personal history of transient ischemic attack (TIA), and cerebral infarction without residual deficits: Secondary | ICD-10-CM | POA: Diagnosis not present

## 2015-07-30 DIAGNOSIS — F0390 Unspecified dementia without behavioral disturbance: Secondary | ICD-10-CM | POA: Diagnosis present

## 2015-07-30 DIAGNOSIS — Z833 Family history of diabetes mellitus: Secondary | ICD-10-CM | POA: Diagnosis not present

## 2015-07-30 DIAGNOSIS — E876 Hypokalemia: Secondary | ICD-10-CM | POA: Diagnosis present

## 2015-07-30 DIAGNOSIS — Z6829 Body mass index (BMI) 29.0-29.9, adult: Secondary | ICD-10-CM | POA: Diagnosis not present

## 2015-07-30 DIAGNOSIS — R7989 Other specified abnormal findings of blood chemistry: Secondary | ICD-10-CM | POA: Diagnosis present

## 2015-07-30 DIAGNOSIS — E1121 Type 2 diabetes mellitus with diabetic nephropathy: Secondary | ICD-10-CM

## 2015-07-30 DIAGNOSIS — I5043 Acute on chronic combined systolic (congestive) and diastolic (congestive) heart failure: Secondary | ICD-10-CM | POA: Diagnosis present

## 2015-07-30 DIAGNOSIS — E1122 Type 2 diabetes mellitus with diabetic chronic kidney disease: Secondary | ICD-10-CM | POA: Diagnosis present

## 2015-07-30 DIAGNOSIS — R627 Adult failure to thrive: Secondary | ICD-10-CM | POA: Diagnosis present

## 2015-07-30 DIAGNOSIS — E785 Hyperlipidemia, unspecified: Secondary | ICD-10-CM | POA: Diagnosis present

## 2015-07-30 DIAGNOSIS — M810 Age-related osteoporosis without current pathological fracture: Secondary | ICD-10-CM | POA: Diagnosis present

## 2015-07-30 DIAGNOSIS — I509 Heart failure, unspecified: Secondary | ICD-10-CM

## 2015-07-30 DIAGNOSIS — N390 Urinary tract infection, site not specified: Secondary | ICD-10-CM | POA: Diagnosis present

## 2015-07-30 DIAGNOSIS — N189 Chronic kidney disease, unspecified: Secondary | ICD-10-CM

## 2015-07-30 DIAGNOSIS — R17 Unspecified jaundice: Secondary | ICD-10-CM | POA: Diagnosis not present

## 2015-07-30 DIAGNOSIS — G894 Chronic pain syndrome: Secondary | ICD-10-CM | POA: Diagnosis present

## 2015-07-30 DIAGNOSIS — R778 Other specified abnormalities of plasma proteins: Secondary | ICD-10-CM | POA: Diagnosis present

## 2015-07-30 DIAGNOSIS — E1140 Type 2 diabetes mellitus with diabetic neuropathy, unspecified: Secondary | ICD-10-CM | POA: Diagnosis present

## 2015-07-30 DIAGNOSIS — L8990 Pressure ulcer of unspecified site, unspecified stage: Secondary | ICD-10-CM | POA: Diagnosis present

## 2015-07-30 DIAGNOSIS — N184 Chronic kidney disease, stage 4 (severe): Secondary | ICD-10-CM | POA: Diagnosis present

## 2015-07-30 DIAGNOSIS — N179 Acute kidney failure, unspecified: Secondary | ICD-10-CM | POA: Diagnosis present

## 2015-07-30 DIAGNOSIS — K219 Gastro-esophageal reflux disease without esophagitis: Secondary | ICD-10-CM | POA: Diagnosis present

## 2015-07-30 DIAGNOSIS — G9341 Metabolic encephalopathy: Secondary | ICD-10-CM | POA: Diagnosis present

## 2015-07-30 DIAGNOSIS — E119 Type 2 diabetes mellitus without complications: Secondary | ICD-10-CM

## 2015-07-30 DIAGNOSIS — I129 Hypertensive chronic kidney disease with stage 1 through stage 4 chronic kidney disease, or unspecified chronic kidney disease: Secondary | ICD-10-CM | POA: Diagnosis present

## 2015-07-30 DIAGNOSIS — I10 Essential (primary) hypertension: Secondary | ICD-10-CM

## 2015-07-30 DIAGNOSIS — I248 Other forms of acute ischemic heart disease: Secondary | ICD-10-CM | POA: Diagnosis present

## 2015-07-30 DIAGNOSIS — R531 Weakness: Secondary | ICD-10-CM | POA: Diagnosis present

## 2015-07-30 DIAGNOSIS — E440 Moderate protein-calorie malnutrition: Secondary | ICD-10-CM | POA: Diagnosis present

## 2015-07-30 DIAGNOSIS — Z515 Encounter for palliative care: Secondary | ICD-10-CM | POA: Diagnosis present

## 2015-07-30 LAB — URINALYSIS, ROUTINE W REFLEX MICROSCOPIC
BILIRUBIN URINE: NEGATIVE
GLUCOSE, UA: NEGATIVE mg/dL
KETONES UR: NEGATIVE mg/dL
NITRITE: NEGATIVE
PH: 7 (ref 5.0–8.0)
PROTEIN: 30 mg/dL — AB
Specific Gravity, Urine: 1.012 (ref 1.005–1.030)

## 2015-07-30 LAB — CBC WITH DIFFERENTIAL/PLATELET
BASOS ABS: 0 10*3/uL (ref 0.0–0.1)
BASOS PCT: 0 %
EOS PCT: 0 %
Eosinophils Absolute: 0 10*3/uL (ref 0.0–0.7)
HCT: 37.3 % (ref 36.0–46.0)
Hemoglobin: 12 g/dL (ref 12.0–15.0)
Lymphocytes Relative: 7 %
Lymphs Abs: 0.8 10*3/uL (ref 0.7–4.0)
MCH: 32 pg (ref 26.0–34.0)
MCHC: 32.2 g/dL (ref 30.0–36.0)
MCV: 99.5 fL (ref 78.0–100.0)
MONO ABS: 0.8 10*3/uL (ref 0.1–1.0)
MONOS PCT: 7 %
Neutro Abs: 10.2 10*3/uL — ABNORMAL HIGH (ref 1.7–7.7)
Neutrophils Relative %: 86 %
PLATELETS: 211 10*3/uL (ref 150–400)
RBC: 3.75 MIL/uL — ABNORMAL LOW (ref 3.87–5.11)
RDW: 14.6 % (ref 11.5–15.5)
WBC: 11.8 10*3/uL — ABNORMAL HIGH (ref 4.0–10.5)

## 2015-07-30 LAB — COMPREHENSIVE METABOLIC PANEL
ALBUMIN: 3.5 g/dL (ref 3.5–5.0)
ALK PHOS: 46 U/L (ref 38–126)
ALT: 8 U/L — AB (ref 14–54)
AST: 18 U/L (ref 15–41)
Anion gap: 13 (ref 5–15)
BILIRUBIN TOTAL: 1.5 mg/dL — AB (ref 0.3–1.2)
BUN: 29 mg/dL — AB (ref 6–20)
CALCIUM: 8.6 mg/dL — AB (ref 8.9–10.3)
CO2: 30 mmol/L (ref 22–32)
Chloride: 98 mmol/L — ABNORMAL LOW (ref 101–111)
Creatinine, Ser: 1.77 mg/dL — ABNORMAL HIGH (ref 0.44–1.00)
GFR calc Af Amer: 28 mL/min — ABNORMAL LOW (ref >60)
GFR calc non Af Amer: 24 mL/min — ABNORMAL LOW (ref >60)
GLUCOSE: 166 mg/dL — AB (ref 65–99)
Potassium: 2.8 mmol/L — ABNORMAL LOW (ref 3.5–5.1)
SODIUM: 141 mmol/L (ref 135–145)
TOTAL PROTEIN: 6.5 g/dL (ref 6.5–8.1)

## 2015-07-30 LAB — MAGNESIUM: MAGNESIUM: 1.8 mg/dL (ref 1.7–2.4)

## 2015-07-30 LAB — URINE MICROSCOPIC-ADD ON: SQUAMOUS EPITHELIAL / LPF: NONE SEEN

## 2015-07-30 LAB — TROPONIN I: Troponin I: 0.4 ng/mL — ABNORMAL HIGH (ref <0.031)

## 2015-07-30 LAB — I-STAT TROPONIN, ED: TROPONIN I, POC: 1.09 ng/mL — AB (ref 0.00–0.08)

## 2015-07-30 LAB — BRAIN NATRIURETIC PEPTIDE: B Natriuretic Peptide: 1298 pg/mL — ABNORMAL HIGH (ref 0.0–100.0)

## 2015-07-30 LAB — I-STAT CG4 LACTIC ACID, ED
LACTIC ACID, VENOUS: 1.03 mmol/L (ref 0.5–2.0)
Lactic Acid, Venous: 1.59 mmol/L (ref 0.5–2.0)

## 2015-07-30 MED ORDER — ENOXAPARIN SODIUM 30 MG/0.3ML ~~LOC~~ SOLN
30.0000 mg | SUBCUTANEOUS | Status: DC
Start: 2015-07-30 — End: 2015-08-01
  Administered 2015-07-30 – 2015-07-31 (×2): 30 mg via SUBCUTANEOUS
  Filled 2015-07-30 (×2): qty 0.3

## 2015-07-30 MED ORDER — ALLOPURINOL 100 MG PO TABS
100.0000 mg | ORAL_TABLET | Freq: Every day | ORAL | Status: DC
  Administered 2015-07-30 – 2015-08-01 (×3): 100 mg via ORAL
  Filled 2015-07-30 (×3): qty 1

## 2015-07-30 MED ORDER — VANCOMYCIN HCL IN DEXTROSE 1-5 GM/200ML-% IV SOLN
1000.0000 mg | INTRAVENOUS | Status: AC
  Administered 2015-07-30: 1000 mg via INTRAVENOUS
  Filled 2015-07-30: qty 200

## 2015-07-30 MED ORDER — POTASSIUM CHLORIDE CRYS ER 20 MEQ PO TBCR
40.0000 meq | EXTENDED_RELEASE_TABLET | Freq: Once | ORAL | Status: AC
  Administered 2015-07-30: 40 meq via ORAL
  Filled 2015-07-30: qty 2

## 2015-07-30 MED ORDER — ASPIRIN EC 81 MG PO TBEC
81.0000 mg | DELAYED_RELEASE_TABLET | Freq: Every day | ORAL | Status: DC
  Administered 2015-07-30 – 2015-08-01 (×3): 81 mg via ORAL
  Filled 2015-07-30 (×3): qty 1

## 2015-07-30 MED ORDER — POTASSIUM CHLORIDE CRYS ER 20 MEQ PO TBCR
20.0000 meq | EXTENDED_RELEASE_TABLET | Freq: Two times a day (BID) | ORAL | Status: DC
  Administered 2015-07-30 – 2015-08-01 (×4): 20 meq via ORAL
  Filled 2015-07-30 (×5): qty 1

## 2015-07-30 MED ORDER — GABAPENTIN 300 MG PO CAPS
300.0000 mg | ORAL_CAPSULE | Freq: Every morning | ORAL | Status: DC
  Administered 2015-07-31 – 2015-08-01 (×2): 300 mg via ORAL
  Filled 2015-07-30 (×2): qty 1

## 2015-07-30 MED ORDER — FUROSEMIDE 10 MG/ML IJ SOLN
80.0000 mg | Freq: Once | INTRAMUSCULAR | Status: AC
  Administered 2015-07-30: 80 mg via INTRAVENOUS
  Filled 2015-07-30: qty 8

## 2015-07-30 MED ORDER — PIPERACILLIN-TAZOBACTAM 3.375 G IVPB: 3.3750 g | Freq: Three times a day (TID) | INTRAVENOUS | Status: DC

## 2015-07-30 MED ORDER — ACETAMINOPHEN 325 MG PO TABS: 650.0000 mg | ORAL_TABLET | Freq: Four times a day (QID) | ORAL | Status: DC | PRN

## 2015-07-30 MED ORDER — POTASSIUM CHLORIDE 10 MEQ/100ML IV SOLN
10.0000 meq | Freq: Once | INTRAVENOUS | Status: AC
  Administered 2015-07-30: 10 meq via INTRAVENOUS
  Filled 2015-07-30: qty 100

## 2015-07-30 MED ORDER — DEXTROSE 5 % IV SOLN
2.0000 g | Freq: Every day | INTRAVENOUS | Status: DC
  Administered 2015-07-30 – 2015-07-31 (×2): 2 g via INTRAVENOUS
  Filled 2015-07-30 (×2): qty 2

## 2015-07-30 MED ORDER — LEVOTHYROXINE SODIUM 25 MCG PO TABS: 25.0000 ug | ORAL_TABLET | ORAL | Status: DC

## 2015-07-30 MED ORDER — ACETAMINOPHEN 650 MG RE SUPP: 650.0000 mg | RECTAL | Status: DC | PRN

## 2015-07-30 MED ORDER — ACETAMINOPHEN 650 MG RE SUPP: 650.0000 mg | Freq: Four times a day (QID) | RECTAL | Status: DC | PRN

## 2015-07-30 MED ORDER — GABAPENTIN 300 MG PO CAPS
600.0000 mg | ORAL_CAPSULE | Freq: Every day | ORAL | Status: DC
  Administered 2015-07-30 – 2015-07-31 (×2): 600 mg via ORAL
  Filled 2015-07-30 (×2): qty 2

## 2015-07-30 MED ORDER — LEVOTHYROXINE SODIUM 25 MCG PO TABS
25.0000 ug | ORAL_TABLET | ORAL | Status: DC
  Administered 2015-07-31: 25 ug via ORAL
  Filled 2015-07-30: qty 1

## 2015-07-30 MED ORDER — CARVEDILOL 6.25 MG PO TABS
6.2500 mg | ORAL_TABLET | Freq: Two times a day (BID) | ORAL | Status: DC
  Administered 2015-07-31 – 2015-08-01 (×2): 6.25 mg via ORAL
  Filled 2015-07-30 (×3): qty 1

## 2015-07-30 MED ORDER — QUETIAPINE FUMARATE 25 MG PO TABS
25.0000 mg | ORAL_TABLET | Freq: Every day | ORAL | Status: DC
  Administered 2015-07-30 – 2015-08-02 (×4): 25 mg via ORAL
  Filled 2015-07-30 (×5): qty 1

## 2015-07-30 MED ORDER — VANCOMYCIN HCL IN DEXTROSE 1-5 GM/200ML-% IV SOLN: 1000.0000 mg | INTRAVENOUS | Status: DC

## 2015-07-30 MED ORDER — PIPERACILLIN-TAZOBACTAM 3.375 G IVPB 30 MIN
3.3750 g | INTRAVENOUS | Status: AC
  Administered 2015-07-30: 3.375 g via INTRAVENOUS
  Filled 2015-07-30: qty 50

## 2015-07-30 MED ORDER — SODIUM CHLORIDE 0.9 % IV BOLUS (SEPSIS)
500.0000 mL | Freq: Once | INTRAVENOUS | Status: AC
  Administered 2015-07-30: 500 mL via INTRAVENOUS

## 2015-07-30 NOTE — ED Notes (Signed)
Bed: [id number] Expected date:  Expected time:  Means of arrival:  Comments: EMS-  81 yo; ? UTI

## 2015-07-30 NOTE — ED Provider Notes (Signed)
CSN: [id number]     Arrival date & time 07/30/15  1553 History   First MD Initiated Contact with Patient 07/30/15 1558     No chief complaint on file.    (Consider location/radiation/quality/duration/timing/severity/associated sxs/prior Treatment) HPI Comments: Patient with past medical history of CHF, diabetes, hypertension, CK D, hyperlipidemia, presents to the emergency department with chief complaint of altered mental status. Patient is brought in by family members, who states that yesterday the patient began acting strangely. She became less social, more tired and fatigued, quit eating and drinking, and has not been responding appropriately. Family members state that the patient recently was treated for a urinary tract infection, but they said that this cleared. They report that she has a history of CHF, and that she has had increased fluid on her legs over the past several days. There are no aggravating or alleviating factors. Nothing makes the symptoms better or worse. Patient is DNR/DNI, and does not want any surgical interventions.  The history is provided by a relative. No language interpreter was used.    Past Medical History  Diagnosis Date  . Hyperlipidemia   . Hypertension   . Type II or unspecified type diabetes mellitus without mention of complication, not stated as uncontrolled   . Diabetic neuropathy (HCC)   . Chronic kidney disease   . GERD (gastroesophageal reflux disease)   . Osteoporosis   . CHF (congestive heart failure) (HCC)   . Cardiomyopathy [facility])    Past Surgical History  Procedure Laterality Date  . Nephrectomy Right 1959  . Breast lumpectomy      [phone]  . Bladder surgery  1984  . Colostomy  2004    Reversed 2005  . Eye surgery  2008 and 2009   Family History  Problem Relation Age of Onset  . Heart disease Father   . Heart disease Sister   . Diabetes Daughter   . Hypertension Son    Social History  Substance Use Topics  . Smoking  status: Never Smoker   . Smokeless tobacco: None  . Alcohol Use: Yes     Comment: Occ wine states she is going to stop   OB History    No data available     Review of Systems  Unable to perform ROS: Mental status change      Allergies  Ace inhibitors; Codeine; Morphine and related; Vitamin c; and Vitamin d analogs  Home Medications   Prior to Admission medications   Medication Sig Start Date End Date Taking? Authorizing Provider  allopurinol (ZYLOPRIM) 100 MG tablet TAKE 1 TABLET (100 MG TOTAL) BY MOUTH DAILY. 07/17/15  Yes Lucky Cowboy, MD  aspirin EC 81 MG EC tablet Take 1 tablet (81 mg total) by mouth daily. 11/07/13  Yes Marinda Elk, MD  B Complex Vitamins (VITAMIN-B COMPLEX PO) Take 1 tablet by mouth every morning.    Yes Historical Provider, MD  carvedilol (COREG) 6.25 MG tablet Take 1 tablet (6.25 mg total) by mouth 2 (two) times daily with a meal. 12/09/14  Yes Lewayne Bunting, MD  Cholecalciferol (VITAMIN D) 2000 UNITS CAPS Take 1,000-2,000 Units by mouth 2 (two) times daily. Take 1 tablet in the morning and 2 tablets in the evening   Yes Historical Provider, MD  diphenhydrAMINE (BENADRYL) 25 MG tablet Take 25 mg by mouth every 6 (six) hours as needed for allergies.   Yes Historical Provider, MD  furosemide (LASIX) 40 MG tablet Take 40 mg by mouth every morning.  11/07/13  Yes Marinda ElkAbraham Feliz Ortiz, MD  gabapentin (NEURONTIN) 300 MG capsule TAKE 1 CAPSULE BY MOUTH IN AM, THEN 1 TO 2 CAPSULES BY MOUTH AT BEDTIME FOR LEG PAINS 01/17/15  Yes Quentin MullingAmanda Collier, PA-C  levothyroxine (SYNTHROID, LEVOTHROID) 50 MCG tablet TAKE 1/2 TABLET BY MOUTH DAILY EXCEPT TAKE 1 TABLET ON TUESDAY AND THURSDAY 06/17/15  Yes Lucky CowboyWilliam McKeown, MD  OVER THE COUNTER MEDICATION Take 1 capsule by mouth 2 (two) times daily. Tumeric   Yes Historical Provider, MD  Probiotic Product (PROBIOTIC DAILY PO) Take 1 tablet by mouth every morning.    Yes Historical Provider, MD  QUEtiapine (SEROQUEL) 25 MG tablet  1/2-2 pills at night Patient taking differently: Take 25 mg by mouth at bedtime.  06/23/15  Yes Quentin MullingAmanda Collier, PA-C   BP 125/39 mmHg  Pulse 68  Temp(Src) 100.2 F (37.9 C) (Rectal)  Resp 25  Wt 90.719 kg  SpO2 100% Physical Exam  Constitutional: She appears well-developed and well-nourished.  HENT:  Head: Normocephalic and atraumatic.  Eyes: Conjunctivae and EOM are normal. Pupils are equal, round, and reactive to light.  Neck: Normal range of motion. Neck supple.  Cardiovascular: Normal rate and regular rhythm.  Exam reveals no gallop and no friction rub.   No murmur heard. Pulmonary/Chest: Effort normal and breath sounds normal. No respiratory distress. She has no wheezes. She has no rales. She exhibits no tenderness.  Abdominal: Soft. Bowel sounds are normal. She exhibits no distension and no mass. There is no tenderness. There is no rebound and no guarding.  No focal abdominal tenderness, no RLQ tenderness or pain at McBurney's point, no RUQ tenderness or Murphy's sign, no left-sided abdominal tenderness, no fluid wave, or signs of peritonitis   Musculoskeletal: Normal range of motion. She exhibits edema. She exhibits no tenderness.  Bilateral lower extremity edema  Neurological:  Somnolent but arousable  Skin: Skin is warm and dry.  Psychiatric: She has a normal mood and affect. Her behavior is normal. Judgment and thought content normal.  Nursing note and vitals reviewed.   ED Course  Procedures (including critical care time) Results for orders placed or performed during the hospital encounter of 07/30/15  Comprehensive metabolic panel  Result Value Ref Range   Sodium 141 135 - 145 mmol/L   Potassium 2.8 (L) 3.5 - 5.1 mmol/L   Chloride 98 (L) 101 - 111 mmol/L   CO2 30 22 - 32 mmol/L   Glucose, Bld 166 (H) 65 - 99 mg/dL   BUN 29 (H) 6 - 20 mg/dL   Creatinine, Ser 1.911.77 (H) 0.44 - 1.00 mg/dL   Calcium 8.6 (L) 8.9 - 10.3 mg/dL   Total Protein 6.5 6.5 - 8.1 g/dL    Albumin 3.5 3.5 - 5.0 g/dL   AST 18 15 - 41 U/L   ALT 8 (L) 14 - 54 U/L   Alkaline Phosphatase 46 38 - 126 U/L   Total Bilirubin 1.5 (H) 0.3 - 1.2 mg/dL   GFR calc non Af Amer 24 (L) >60 mL/min   GFR calc Af Amer 28 (L) >60 mL/min   Anion gap 13 5 - 15  CBC with Differential  Result Value Ref Range   WBC 11.8 (H) 4.0 - 10.5 K/uL   RBC 3.75 (L) 3.87 - 5.11 MIL/uL   Hemoglobin 12.0 12.0 - 15.0 g/dL   HCT 47.837.3 29.536.0 - 62.146.0 %   MCV 99.5 78.0 - 100.0 fL   MCH 32.0 26.0 - 34.0 pg   MCHC  32.2 30.0 - 36.0 g/dL   RDW 16.1 09.6 - 04.5 %   Platelets 211 150 - 400 K/uL   Neutrophils Relative % 86 %   Neutro Abs 10.2 (H) 1.7 - 7.7 K/uL   Lymphocytes Relative 7 %   Lymphs Abs 0.8 0.7 - 4.0 K/uL   Monocytes Relative 7 %   Monocytes Absolute 0.8 0.1 - 1.0 K/uL   Eosinophils Relative 0 %   Eosinophils Absolute 0.0 0.0 - 0.7 K/uL   Basophils Relative 0 %   Basophils Absolute 0.0 0.0 - 0.1 K/uL  Urinalysis, Routine w reflex microscopic (not at [facility])  Result Value Ref Range   Color, Urine YELLOW YELLOW   APPearance CLOUDY (A) CLEAR   Specific Gravity, Urine 1.012 1.005 - 1.030   pH 7.0 5.0 - 8.0   Glucose, UA NEGATIVE NEGATIVE mg/dL   Hgb urine dipstick MODERATE (A) NEGATIVE   Bilirubin Urine NEGATIVE NEGATIVE   Ketones, ur NEGATIVE NEGATIVE mg/dL   Protein, ur 30 (A) NEGATIVE mg/dL   Nitrite NEGATIVE NEGATIVE   Leukocytes, UA LARGE (A) NEGATIVE  Brain natriuretic peptide  Result Value Ref Range   B Natriuretic Peptide 1298.0 (H) 0.0 - 100.0 pg/mL  Urine microscopic-add on  Result Value Ref Range   Squamous Epithelial / LPF NONE SEEN NONE SEEN   WBC, UA TOO NUMEROUS TO COUNT 0 - 5 WBC/hpf   RBC / HPF 0-5 0 - 5 RBC/hpf   Bacteria, UA MANY (A) NONE SEEN  Troponin I  Result Value Ref Range   Troponin I 0.40 (H) <0.031 ng/mL  Magnesium  Result Value Ref Range   Magnesium 1.8 1.7 - 2.4 mg/dL  I-Stat CG4 Lactic Acid, ED (Not at [facility])  Result Value Ref Range   Lactic Acid, Venous 1.59  0.5 - 2.0 mmol/L  I-Stat Troponin, ED (not at [facility])  Result Value Ref Range   Troponin i, poc 1.09 (HH) 0.00 - 0.08 ng/mL   Comment NOTIFIED PHYSICIAN    Comment 3          I-Stat CG4 Lactic Acid, ED (Not at [facility])  Result Value Ref Range   Lactic Acid, Venous 1.03 0.5 - 2.0 mmol/L   Dg Chest 2 View  07/30/2015  CLINICAL DATA:  Short of breath, hypertension, diabetes EXAM: CHEST  2 VIEW COMPARISON:  09/30/2014 FINDINGS: Stable enlarged cardiac silhouette. Lungs are hyperinflated. Small bilateral pleural effusions. Chronic bronchitic markings centrally similar to prior. IMPRESSION: Cardiomegaly and chronic bronchitic markings with hyperinflated lungs. Small pleural effusions. Electronically Signed   By: Genevive Bi M.D.   On: 07/30/2015 17:11   Dg Shoulder 1v Right  07/03/2015  CLINICAL DATA:  Status post fall over a week ago. EXAM: RIGHT SHOULDER - 1 VIEW COMPARISON:  None. FINDINGS: There is no fracture or dislocation. There is loss of the normal acromiohumeral distance as can be seen with a rotator cuff tear. There are mild degenerative changes of the acromioclavicular joint. IMPRESSION: No acute osseous injury of the right shoulder on a single view. Electronically Signed   By: Elige Ko   On: 07/03/2015 18:52   Ct Head Wo Contrast  07/30/2015  CLINICAL DATA:  Decreased level of consciousness for 2 days. Altered mental status. EXAM: CT HEAD WITHOUT CONTRAST TECHNIQUE: Contiguous axial images were obtained from the base of the skull through the vertex without intravenous contrast. COMPARISON:  CT head without contrast 09/30/2014. FINDINGS: Moderate atrophy and white matter changes are stable. No acute cortical infarct  for hemorrhage is present. There is no mass lesion. The ventricles are proportionate to the degree of atrophy. No significant extra-axial fluid collection is present. A remote lacunar infarct is again noted within the right cerebellum. There is chronic opacification of the  left globe. The orbits are otherwise within normal limits. The paranasal sinuses and mastoid air cells are clear. The calvarium is intact. IMPRESSION: 1. No acute intracranial abnormality or significant interval change. 2. Stable moderate atrophy and white matter disease. Electronically Signed   By: Marin Roberts M.D.   On: 07/30/2015 17:15   Dg Humerus Right  07/03/2015  CLINICAL DATA:  Pain following fall EXAM: RIGHT HUMERUS - 2+ VIEW COMPARISON:  None. FINDINGS: Frontal and lateral views were obtained. There is no acute fracture or dislocation. There is moderate generalized osteoarthritic change in the right shoulder. There are scattered calcified phleboliths in the distal upper arm region. IMPRESSION: Osteoarthritic change right shoulder.  No fracture or dislocation. Electronically Signed   By: Bretta Bang III M.D.   On: 07/03/2015 18:51    I have personally reviewed and evaluated these images and lab results as part of my medical decision-making.   EKG Interpretation   Date/Time:  Sunday July 30 2015 16:32:58 EST Ventricular Rate:  66 PR Interval:  159 QRS Duration: 190 QT Interval:  502 QTC Calculation: 526 R Axis:   -5 Text Interpretation:  Sinus rhythm Left bundle branch block Confirmed by  Donnald Garre, MD, Lebron Conners [phone]) on 07/30/2015 4:44:57 PM      MDM   Final diagnoses:  Acute on chronic congestive heart failure, unspecified congestive heart failure type (HCC)  UTI (lower urinary tract infection)    Patient with AMS, decreased activity over the past 2 days.  She has had a recent UTI.  Will check UA.  No falls, but as patient is somewhat altered will check head ct and labs.  Will reassess.  UA remarkable for too numerous to count white blood cells, suspect UTI. Will start antibiotics.  1445- Patient discussed with Dr. Donnald Garre.  Reviewed positive troponin results.  Recommend continuing antibiotics, but backing off on fluids.  500 ml NS bolus.  Patient does  have a history of CHF, could be some demand ischemia.  BNP is nearly 1300. Patient actually looks improved after 500 mL bolus compared to initial impression.  Will consult hospitalist for admission.  Appreciate Dr. Maryfrances Bunnell for admitting the patient.  Roxy Horseman, PA-C 07/30/15 6045  Arby Barrette, MD 08/12/15 [phone]

## 2015-07-30 NOTE — Progress Notes (Signed)
I am taking over care of pt and agree with admission assessment.

## 2015-07-30 NOTE — Progress Notes (Signed)
ANTIBIOTIC CONSULT NOTE - INITIAL  Pharmacy Consult for Vancomycin, Zosyn Indication: Sepsis  Allergies  Allergen Reactions  . Ace Inhibitors Swelling  . Codeine Other (See Comments)    dysuria  . Morphine And Related Other (See Comments)    dysuria  . Vitamin C Other (See Comments)    dysuria  . Vitamin D Analogs Other (See Comments)    Large doses cause dysuria    Patient Measurements: Weight: 200 lb (90.719 kg)  Vital Signs: Temp: 100.2 F (37.9 C) (12/25 1614) Temp Source: Rectal (12/25 1614) BP: 125/39 mmHg (12/25 1635) Pulse Rate: 68 (12/25 1635) Intake/Output from previous day:   Intake/Output from this shift:    Labs:  Recent Labs  07/30/15 1632  WBC 11.8*  HGB 12.0  PLT 211  CREATININE 1.77*   Estimated Creatinine Clearance: 21.2 mL/min (by C-G formula based on Cr of 1.77). No results for input(s): VANCOTROUGH, VANCOPEAK, VANCORANDOM, GENTTROUGH, GENTPEAK, GENTRANDOM, TOBRATROUGH, TOBRAPEAK, TOBRARND, AMIKACINPEAK, AMIKACINTROU, AMIKACIN in the last 72 hours.   Microbiology: Recent Results (from the past 720 hour(s))  Urine culture     Status: None   Collection Time: 07/17/15 10:38 AM  Result Value Ref Range Status   Colony Count NO GROWTH  Final   Organism ID, Bacteria NO GROWTH  Final    Medical History: Past Medical History  Diagnosis Date  . Hyperlipidemia   . Hypertension   . Type II or unspecified type diabetes mellitus without mention of complication, not stated as uncontrolled   . Diabetic neuropathy (HCC)   . Chronic kidney disease   . GERD (gastroesophageal reflux disease)   . Osteoporosis   . CHF (congestive heart failure) (HCC)   . Cardiomyopathy [facility](HCC)      Assessment: [age] yoF presents to ED via EMS from home with decreased LOC and mobility.  For suspected sepsis, pharmacy is consulted to dose Vancomycin and Zosyn.  Today, 07/30/2015: Tm 100.2 WBC 11.8 SCr 1.77 with CrCl ~ 21 ml/min  Lactic acid: 1.59  Antimicrobials  this admission: 12/25 >> Vanc >> 12/25 >> Zosyn >>  Levels/dose changes this admission: None  Microbiology Results: 12/25 BCx: ordered 12/25 UCx: ordered   Goal of Therapy:  Vancomycin trough level 15-20 mcg/ml  Appropriate abx dosing, eradication of infection.   Plan:   Zosyn 3.375g IV Q8H infused over 4hrs.   Vancomycin 1g IV q24h.  Measure Vanc trough at steady state.  Follow up renal fxn, culture results, and clinical course.  Lynann Beaverhristine Kamali Sakata PharmD, BCPS Pager [phone] 07/30/2015 5:24 PM

## 2015-07-30 NOTE — ED Notes (Signed)
Pt c/o left arm pain sudden onset. Denies CP or SOB. EKG given to EDP Clarice PolePfeifer and EDPA Molly Maduroobert made aware. Pt with NAD. Family present. Vitals recorded

## 2015-07-30 NOTE — ED Notes (Signed)
Per [facility] pt from home , family reports decrease loc x 2 days and mobility. Pt has been treated for bladder infection 2 weeks ago, last dose of ATB was yesterday. Hx dementia. Per [facility] pt is disoriented x 4. Pt reported to EMS abd pain , recent dx of possible hernia per family per EMS.

## 2015-07-30 NOTE — ED Notes (Signed)
Patient transported to X-ray 

## 2015-07-30 NOTE — ED Notes (Signed)
EDPA ROBERT at bedside. 

## 2015-07-30 NOTE — ED Notes (Signed)
MD at bedside. 

## 2015-07-30 NOTE — ED Notes (Signed)
Hospitalist and family at bedside

## 2015-07-30 NOTE — H&P (Addendum)
History and Physical  Patient Name: [name]     XKG:[id number]    DOB: 29-Sep-1921    DOA: 07/30/2015 Referring physician: Lorre Munroe, PA-C PCP: Alesia Richards, MD      Chief Complaint: Weakness and delirium  HPI: [name] is a 81 y.o. female with a past medical history significant for dementia, CHF with last EF 20-25% in 2015, single kidney with CKD III baseline eGFR 39/Cr 1.4, HTN, NIDDM diet-controlled, and gout who presents with decline.  Over the last week, the patient has had a decline.  She lives with her daughter and about 10 days ago, she had several days of confusion and weakness and breathlessness.  This was bad enough that family called 9-1-1, but then cancelled it because she seemed to be doing better.  They went to her PCP on Thursday, who prescribed a wheelchair.    Now, over the weekend, the patient has again gotten weak.  She has chronic sundowning for which she is on Seroquel for the last 3 weeks, but she has been sleeping during the day and more altered/confused at night than usual.  Her daughter has noted leg swelling, panting and dyspnea, mounting confusion, and URI symptoms as well as decreased oral intake ("she is taking nothing at all to eat or drink").    In the ED, the patient had a low grade fever and hypoxia.  There was AKI, elevated bilirubin, slight leukocytosis, elevated troponin, and markedly elevatd BNP.  A chest x-ray showed pulmonary edema and UA showed pyuria and bacteriuria.  Vancomycin and piperacillin-tazobactam were administered for possible sepsis without source and [facility] were called to admit, in DNR status.     Review of Systems:  Pt complains of nothing.  Daughter notes leg swelling, dyspnea, decreased mental status, decreased oral intake, increased confusion/delirium, URI symptoms as above.  THe patient is chronically incontinent, worse lately. All other systems negative except as just noted or noted in the history of  present illness.  Allergies  Allergen Reactions  . Ace Inhibitors Swelling  . Codeine Other (See Comments)    dysuria  . Morphine And Related Other (See Comments)    dysuria  . Vitamin C Other (See Comments)    dysuria  . Vitamin D Analogs Other (See Comments)    Large doses cause dysuria    Prior to Admission medications   Medication Sig Start Date End Date Taking? Authorizing Provider  allopurinol (ZYLOPRIM) 100 MG tablet TAKE 1 TABLET (100 MG TOTAL) BY MOUTH DAILY. 07/17/15  Yes Unk Pinto, MD  aspirin EC 81 MG EC tablet Take 1 tablet (81 mg total) by mouth daily. 11/07/13  Yes Charlynne Cousins, MD  B Complex Vitamins (VITAMIN-B COMPLEX PO) Take 1 tablet by mouth every morning.    Yes Historical Provider, MD  carvedilol (COREG) 6.25 MG tablet Take 1 tablet (6.25 mg total) by mouth 2 (two) times daily with a meal. 12/09/14  Yes Lelon Perla, MD  Cholecalciferol (VITAMIN D) 2000 UNITS CAPS Take 1,000-2,000 Units by mouth 2 (two) times daily. Take 1 tablet in the morning and 2 tablets in the evening   Yes Historical Provider, MD  diphenhydrAMINE (BENADRYL) 25 MG tablet Take 25 mg by mouth every 6 (six) hours as needed for allergies.   Yes Historical Provider, MD  furosemide (LASIX) 40 MG tablet Take 40 mg by mouth every morning.  11/07/13  Yes Charlynne Cousins, MD  gabapentin (NEURONTIN) 300 MG capsule TAKE 1 CAPSULE  BY MOUTH IN AM, THEN 1 TO 2 CAPSULES BY MOUTH AT BEDTIME FOR LEG PAINS 01/17/15  Yes Vicie Mutters, PA-C  levothyroxine (SYNTHROID, LEVOTHROID) 50 MCG tablet TAKE 1/2 TABLET BY MOUTH DAILY EXCEPT TAKE 1 TABLET ON TUESDAY AND THURSDAY 06/17/15  Yes Unk Pinto, MD  OVER THE COUNTER MEDICATION Take 1 capsule by mouth 2 (two) times daily. Tumeric   Yes Historical Provider, MD  Probiotic Product (PROBIOTIC DAILY PO) Take 1 tablet by mouth every morning.    Yes Historical Provider, MD  QUEtiapine (SEROQUEL) 25 MG tablet 1/2-2 pills at night Patient taking  differently: Take 25 mg by mouth at bedtime.  06/23/15  Yes Vicie Mutters, PA-C    Past Medical History  Diagnosis Date  . Hyperlipidemia   . Hypertension   . Type II or unspecified type diabetes mellitus without mention of complication, not stated as uncontrolled   . Diabetic neuropathy ([place])   . Chronic kidney disease   . GERD (gastroesophageal reflux disease)   . Osteoporosis   . CHF (congestive heart failure) ([place])   . Cardiomyopathy [facility])     Past Surgical History  Procedure Laterality Date  . Nephrectomy Right 1959  . Breast lumpectomy      [phone]  . Bladder surgery  1984  . Colostomy  2004    Reversed 2005  . Eye surgery  2008 and 2009    Family history: family history includes Diabetes in her daughter; Heart disease in her father and sister; Hypertension in her son.  Social History: Patient lives at home with her daugther.  She uses a walker and was recently prescribed a wheelchair because she cannot walk, and her PCP thought this was consistent with her general decline and dementia.  She is from [place], near [place] originally.  She has a son and daugther, the lattter of whom is her POA and lives in [place].  She is a non-smoker.         Physical Exam: BP 123/44 mmHg  Pulse 58  Temp(Src) 97.6 F (36.4 C) (Oral)  Resp 21  Wt 90.719 kg (200 lb)  SpO2 100% General appearance: Frail elderly female, sleepy, but rousable, tachypneic. Eyes: Anicteric, right eye reactive, left not.     ENT: No nasal deformity, discharge, or epistaxis.  OP moist without lesions.   Lymph: No cervical, supraclavicular lymphadenopathy. Skin: Warm and dry.  No jaundice.  Cap refill normal and warm. Cardiac: RRR, nl S1-S2, no murmurs appreciated.  JVP not visible.  Leg swelling bilaterally without pitting edema.  Radial pulses 2+ and symmetric. Respiratory: Tachypneic, scant crackles at bases. Abdomen: Abdomen soft without rigidity.  ?left sided hernia, not tender. No ascites,  distension.   Neuro: Sleepy, but orented to person, place and time.  Responding to questions, falls back asleep.  Speech is fluent.  Lower and upper extremity strength symmetric but 4-/5.  Psych:   Affect blunted and sleepy.  No evidence of aural or visual hallucinations or delusions.       Labs on Admission:  The metabolic panel shows hypokalemia, AoCKI. Bilirubin slightly up at 1.5 mg/dL. TNI 1.09 ng/mL elevated. BNP elevated at 1298 pg/mL. Lactic acid normal. Urinalyiss with pyuria and bacteriuria. The complete blood count shows mild leukocytosis 11.8K/uL.   Radiological Exams on Admission: Personally reviewed: Dg Chest 2 View 07/30/2015   Pulmonary edema, small bilateral pleural effusions.    Ct Head Wo Contrast 07/30/2015 [facility]    EKG: Independently reviewed. Sinus, old LBBB.  Rate 60.  QRS 187, widened from April at 145.  No STEMI by [place] crit.  QTc 525.    Assessment/Plan 1. Acute CHF:  This is new.  Acute on chronic systolic and diastolic.  The patient has reported swelling, dyspnea, and elevated troponin with pulmonary edema on CXR (physical signs of fluid overload are not prominent), with widened QRS and what I suspect is congestive hepatopathy and nephropathy.   -Lasix 80 mg IV once -Strict I/Os, daily weights, tele -Consider foley catheter given patient debility -Repeat echocardiogram -Consult to Cardiology -Continue aspirin -Consult to Palliative Care  2. AoCKI:  Suspect congestive nephropathy -Diuresis as above  3. Elevated troponin:  Suspect demand.  Possible recent/resolving MI? -Trend troponin -Echocardiogram -Consult to Cardiology  4. Elevated bilirubin:  Suspect congestive hepatopathy -Trend LFT  5. Hypokalemia:  -Supplement K -Check magnesium  6. UTI:  The patient had a CoNS infection in November, treated with nitrofurantoin, per family.  Repeat UA this month was clear.  Now pyuric again.  Although the patient meets SIRS criteria,  her predominant disease process is believed to be #1 above, and not sepsis. -Vancomycin and ceftriaxone for community acquired UTI, recent CoNS -Follow urine culture and deescalate -Follow blood culture  7. NIDDM:  Normoglycemic at admission -Diabetic diet -Continue gabapentin for neuropathy   8. Gout -Continue allopurinol  9. Dementia Risks and benefits of quetiapine were not reviewed with family, but I would favor tapering and discontinuing this medicine.  10. Hypothyroidism: Recent TSH normal. -Contniue levothyroxine   DVT PPx: Lovenox Diet: Diabetic, heart Consultants: Cardiology, Palliative Care Code Status: DO NOT RESUSCITATE Family Communication: Daughter and son in law present at the bedside.  All questions answered.  CODE STATUS confirmed.  Medical decision making: What exists of the patient's previous chart was reviewed in depth and the case was discussed with Lorre Munroe, PA-C. Patient seen 8:10 PM on 07/30/2015.  Disposition Plan:  Admit to tele inpatient for acute systolic CHF with UTI.  Consult to palliative care and Cardiology.  Anticipate 4-5 days admission.      Edwin Dada [facility] Pager [phone]

## 2015-07-31 ENCOUNTER — Inpatient Hospital Stay (HOSPITAL_COMMUNITY): Payer: Medicare Other

## 2015-07-31 ENCOUNTER — Encounter: Payer: Self-pay | Admitting: Physician Assistant

## 2015-07-31 DIAGNOSIS — I509 Heart failure, unspecified: Secondary | ICD-10-CM

## 2015-07-31 DIAGNOSIS — L8990 Pressure ulcer of unspecified site, unspecified stage: Secondary | ICD-10-CM | POA: Insufficient documentation

## 2015-07-31 LAB — HEPATIC FUNCTION PANEL
ALK PHOS: 44 U/L (ref 38–126)
ALT: 8 U/L — AB (ref 14–54)
AST: 22 U/L (ref 15–41)
Albumin: 3.5 g/dL (ref 3.5–5.0)
BILIRUBIN DIRECT: 0.2 mg/dL (ref 0.1–0.5)
BILIRUBIN TOTAL: 0.9 mg/dL (ref 0.3–1.2)
Indirect Bilirubin: 0.7 mg/dL (ref 0.3–0.9)
TOTAL PROTEIN: 6.5 g/dL (ref 6.5–8.1)

## 2015-07-31 LAB — CBC
HCT: 37.2 % (ref 36.0–46.0)
HEMOGLOBIN: 12 g/dL (ref 12.0–15.0)
MCH: 31.7 pg (ref 26.0–34.0)
MCHC: 32.3 g/dL (ref 30.0–36.0)
MCV: 98.2 fL (ref 78.0–100.0)
PLATELETS: 201 10*3/uL (ref 150–400)
RBC: 3.79 MIL/uL — AB (ref 3.87–5.11)
RDW: 14.6 % (ref 11.5–15.5)
WBC: 10.6 10*3/uL — AB (ref 4.0–10.5)

## 2015-07-31 LAB — BASIC METABOLIC PANEL
ANION GAP: 13 (ref 5–15)
BUN: 34 mg/dL — AB (ref 6–20)
CHLORIDE: 96 mmol/L — AB (ref 101–111)
CO2: 32 mmol/L (ref 22–32)
Calcium: 8.6 mg/dL — ABNORMAL LOW (ref 8.9–10.3)
Creatinine, Ser: 1.96 mg/dL — ABNORMAL HIGH (ref 0.44–1.00)
GFR calc Af Amer: 25 mL/min — ABNORMAL LOW (ref >60)
GFR, EST NON AFRICAN AMERICAN: 21 mL/min — AB (ref >60)
GLUCOSE: 143 mg/dL — AB (ref 65–99)
POTASSIUM: 3.6 mmol/L (ref 3.5–5.1)
Sodium: 141 mmol/L (ref 135–145)

## 2015-07-31 LAB — TROPONIN I
Troponin I: 0.72 ng/mL (ref <0.031)
Troponin I: 0.59 ng/mL (ref <0.031)

## 2015-07-31 LAB — GLUCOSE, CAPILLARY
GLUCOSE-CAPILLARY: 191 mg/dL — AB (ref 65–99)
Glucose-Capillary: 132 mg/dL — ABNORMAL HIGH (ref 65–99)
Glucose-Capillary: 121 mg/dL — ABNORMAL HIGH (ref 65–99)
Glucose-Capillary: 143 mg/dL — ABNORMAL HIGH (ref 65–99)

## 2015-07-31 MED ORDER — FUROSEMIDE 10 MG/ML IJ SOLN
40.0000 mg | Freq: Two times a day (BID) | INTRAMUSCULAR | Status: DC
  Administered 2015-07-31 – 2015-08-01 (×3): 40 mg via INTRAVENOUS
  Filled 2015-07-31 (×3): qty 4

## 2015-07-31 MED ORDER — LEVOTHYROXINE SODIUM 50 MCG PO TABS
50.0000 ug | ORAL_TABLET | ORAL | Status: DC
  Administered 2015-08-01: 50 ug via ORAL
  Filled 2015-07-31: qty 1

## 2015-07-31 NOTE — Progress Notes (Signed)
Last known void before 2100.  Hx of incontinence.  Given 80 iv lasix at 2200 and has had 350cc's in but no void.  Placed on bedpan x2.  Pt denies need to void. Bladder scan x3 shows only consistent 250cc.  I have notified Lenny Pastelom Callahan on call and will continue to monitor.

## 2015-07-31 NOTE — Progress Notes (Addendum)
Went to place foley and pt had large incont urine.  So I did not place foley and informed on call who cancels foley at this time and we will continue to monitor.

## 2015-07-31 NOTE — Progress Notes (Signed)
ANTIBIOTIC CONSULT NOTE - INITIAL  Pharmacy Consult for Ceftriaxone Indication: UTI  Allergies  Allergen Reactions  . Ace Inhibitors Swelling  . Codeine Other (See Comments)    dysuria  . Morphine And Related Other (See Comments)    dysuria  . Vitamin C Other (See Comments)    dysuria  . Vitamin D Analogs Other (See Comments)    Large doses cause dysuria    Patient Measurements: Height: 5\' 1"  (154.9 cm) Weight: 154 lb 12.2 oz (70.2 kg) IBW/kg (Calculated) : 47.8 Adjusted Body Weight:   Vital Signs: Temp: 97.6 F (36.4 C) (12/26 0514) Temp Source: Oral (12/26 0514) BP: 139/59 mmHg (12/26 0514) Pulse Rate: 100 (12/26 0514) Intake/Output from previous day: 12/25 0701 - 12/26 0700 In: 1310 [P.O.:360; I.V.:900; IV Piggyback:50] Out: -  Intake/Output from this shift: Total I/O In: 810 [P.O.:360; I.V.:400; IV Piggyback:50] Out: -   Labs:  Recent Labs  07/30/15 1632 07/31/15 0412  WBC 11.8* 10.6*  HGB 12.0 12.0  PLT 211 201  CREATININE 1.77* 1.96*   Estimated Creatinine Clearance: 16.8 mL/min (by C-G formula based on Cr of 1.96). No results for input(s): VANCOTROUGH, VANCOPEAK, VANCORANDOM, GENTTROUGH, GENTPEAK, GENTRANDOM, TOBRATROUGH, TOBRAPEAK, TOBRARND, AMIKACINPEAK, AMIKACINTROU, AMIKACIN in the last 72 hours.   Microbiology: Recent Results (from the past 720 hour(s))  Urine culture     Status: None   Collection Time: 07/17/15 10:38 AM  Result Value Ref Range Status   Colony Count NO GROWTH  Final   Organism ID, Bacteria NO GROWTH  Final    Medical History: Past Medical History  Diagnosis Date  . Hyperlipidemia   . Hypertension   . Type II or unspecified type diabetes mellitus without mention of complication, not stated as uncontrolled   . Diabetic neuropathy (H[facility])   . Chronic kidney disease   . GERD (gastroesophageal reflux disease)   . Osteoporosis   . CHF (congestive heart failure) (HCC)   . Cardiomyopathy (H[facility])     Medications:   Anti-infectives    Start     Dose/Rate Route Frequency Ordered Stop   07/31/15 1800  vancomycin (VANCOCIN) IVPB 1000 mg/200 mL premix     1,000 mg 200 mL/hr over 60 Minutes Intravenous Every 24 hours 07/30/15 1903     07/31/15 0000  piperacillin-tazobactam (ZOSYN) IVPB 3.375 g  Status:  Discontinued     3.375 g 12.5 mL/hr over 240 Minutes Intravenous Every 8 hours 07/30/15 1903 07/30/15 2057   07/30/15 2200  cefTRIAXone (ROCEPHIN) 2 g in dextrose 5 % 50 mL IVPB     2 g 100 mL/hr over 30 Minutes Intravenous Daily at bedtime 07/30/15 2124     07/30/15 1745  vancomycin (VANCOCIN) IVPB 1000 mg/200 mL premix     1,000 mg 200 mL/hr over 60 Minutes Intravenous STAT 07/30/15 1735 07/30/15 1922   07/30/15 1730  piperacillin-tazobactam (ZOSYN) IVPB 3.375 g     3.375 g 100 mL/hr over 30 Minutes Intravenous STAT 07/30/15 1723 07/30/15 1813     Assessment: Patient already being followed by pharmacy for prior antibiotics.  MD changed to ceftriaxone per pharmacy for UTI.  Given recent sepsis, age and female gender will dose for upper end of dosing range for UTI.  Goal of Therapy:  Rocephin based on manufacturer dosing recommendations.  Plan: Ceftriaxone 2gm iv q24hr Follow up culture results  [name], [name] 07/31/2015,5:41 AM

## 2015-07-31 NOTE — Progress Notes (Addendum)
CRITICAL VALUE ALERT  Critical value received:  Both sets of aerobic bottles growing gram negative rods  Date of notification:  07/31/15  Time of notification:  0832  Critical value read back:Yes.    Nurse who received alert:  Phillis KnackKatelyn Kaydi Kley, RN  MD notified (1st page):  Dr. Jomarie LongsJoseph  Time of first page:  [phone]  MD notified (2nd page):  Time of second page:  Responding MD: Dr. Jomarie LongsJoseph  Time MD responded: 0845- No new orders. Continue current IV ABT.

## 2015-07-31 NOTE — Progress Notes (Signed)
2nd troponi comes back at 0.72.

## 2015-07-31 NOTE — Care Management Note (Signed)
Case Management Note  Patient Details  Name: [name] MRN: [id number] Date of Birth: 24-Jul-1922  Subjective/Objective:     Pt admitted with SOB               Action/Plan: From Home will continue to follow for disposition   Expected Discharge Date:  08/01/15               Expected Discharge Plan:  Home w Home Health Services  In-House Referral:  Clinical Social Work  Discharge planning Services  CM Consult  Post Acute Care Choice:    Choice offered to:     DME Arranged:    DME Agency:     HH Arranged:    HH Agency:     Status of Service:  In process, will continue to follow  Medicare Important Message Given:    Date Medicare IM Given:    Medicare IM give by:    Date Additional Medicare IM Given:    Additional Medicare Important Message give by:     If discussed at Long Length of Stay Meetings, dates discussed:    Additional CommentsGeni Bers:  Ellason Segar, RN 07/31/2015, 4:17 PM

## 2015-07-31 NOTE — Progress Notes (Addendum)
[facility] PROGRESS NOTE  [name] ZOX:[id number] DOB: 1922/05/29 DOA: 07/30/2015 PCP: Nadean Corwin, MD  Assessment/Plan: 1. Acute on Chronic Systolic CHF -EF is 30%, baseline around 25% -Continue IV Lasix today, KCl -Continue coreg -Conservative management only  2. UTI with sepsis -Improving, blood cultures with gram-negative rods, urine cultures pending -Continue ceftriaxone, follow-up cultures  3. AKi on CKD 3 -baseline creatinine 1.4 - due to sepsis, cardiorenal syndrome - monitor with diuresis  4. Elevated troponin -due to Demand/CHF/Sepsis -continue ASA/Coreg, no need for Cards consult, conservative management only  5. DM -SSI  6. Dementia/Sundowning -failure to thrive, on seroquel QHS -Palliative consult for Goals of care, daughter wanting this for End of life discussions  7. Gout -Continue allopurinol  8.  Hypothyroidism: -Recent TSH normal. -Contniue levothyroxine  DVt proph: lovenox  Code Status: DNR Family Communication: daughter at bedside, Mills Koller taylor [phone] Disposition Plan: to be determined   Consultants:  HPI/Subjective: Looks better than yesterday per daughter  Objective: Filed Vitals:   07/30/15 2100 07/31/15 0514  BP: 127/42 139/59  Pulse: 61 100  Temp: 97.8 F (36.6 C) 97.6 F (36.4 C)  Resp: 20 20    Intake/Output Summary (Last 24 hours) at 07/31/15 1034 Last data filed at 07/31/15 0400  Gross per 24 hour  Intake   1310 ml  Output      0 ml  Net   1310 ml   Filed Weights   07/30/15 1613 07/30/15 2100 07/31/15 0500  Weight: 90.719 kg (200 lb) 70.2 kg (154 lb 12.2 oz) 70.2 kg (154 lb 12.2 oz)    Exam:   General:  Alert, awake, oriented to self, answers simple questions  Cardiovascular:S1S2/RRR  Respiratory: decreased BS at bases  Abdomen: soft, NT, BS present  Musculoskeletal:trace edema   Data Reviewed: Basic Metabolic Panel:  Recent Labs Lab 07/27/15 1239  07/30/15 1632 07/30/15 2200 07/31/15 0412  NA 140 141  --  141  K 3.6 2.8*  --  3.6  CL 97* 98*  --  96*  CO2 34* 30  --  32  GLUCOSE 147* 166*  --  143*  BUN 24 29*  --  34*  CREATININE 1.53* 1.77*  --  1.96*  CALCIUM 8.7 8.6*  --  8.6*  MG  --   --  1.8  --    Liver Function Tests:  Recent Labs Lab 07/27/15 1239 07/30/15 1632 07/31/15 0412  AST ALT 4* 8* 8*  ALKPHOS 49 46 44  BILITOT 0.6 1.5* 0.9  PROT 6.0* 6.5 6.5  ALBUMIN 3.5* 3.5 3.5   No results for input(s): LIPASE, AMYLASE in the last 168 hours. No results for input(s): AMMONIA in the last 168 hours. CBC:  Recent Labs Lab 07/27/15 1239 07/30/15 1632 07/31/15 0412  WBC 6.2 11.8* 10.6*  NEUTROABS 4.3 10.2*  --   HGB 12.6 12.0 12.0  HCT 38.6 37.3 37.2  MCV 98.0 99.5 98.2  PLT 247 211 201   Cardiac Enzymes:  Recent Labs Lab 07/30/15 2200 07/31/15 0412 07/31/15 0910  TROPONINI 0.40* 0.72* 0.59*   BNP (last 3 results)  Recent Labs  07/30/15 1643  BNP 1298.0*    ProBNP (last 3 results) No results for input(s): PROBNP in the last 8760 hours.  CBG:  Recent Labs Lab 07/31/15 0735  GLUCAP 132*    Recent Results (from the past 240 hour(s))  Urine culture     Status: None (Preliminary result)   Collection  Time: 07/30/15  4:15 PM  Result Value Ref Range Status   Specimen Description URINE, RANDOM  Final   Special Requests NONE  Final   Culture   Final    TOO YOUNG TO READ [facility]    Report Status PENDING  Incomplete  Culture, blood (routine x 2)     Status: None (Preliminary result)   Collection Time: 07/30/15  4:43 PM  Result Value Ref Range Status   Specimen Description BLOOD LEFT WRIST  Final   Special Requests BOTTLES DRAWN AEROBIC AND ANAEROBIC 5CC  Final   Culture  Setup Time   Final    GRAM NEGATIVE RODS AEROBIC BOTTLE ONLY CRITICAL RESULT CALLED TO, READ BACK BY AND VERIFIED WITH: C SEAY,RN AT [id number] 07/31/15 BY L BENFIELD    Culture    Final    GRAM NEGATIVE RODS Performed [facility]    Report Status PENDING  Incomplete  Culture, blood (routine x 2)     Status: None (Preliminary result)   Collection Time: 07/30/15  4:43 PM  Result Value Ref Range Status   Specimen Description BLOOD LEFT ANTECUBITAL  Final   Special Requests BOTTLES DRAWN AEROBIC AND ANAEROBIC 5CC  Final   Culture  Setup Time   Final    GRAM NEGATIVE RODS AEROBIC BOTTLE ONLY CRITICAL RESULT CALLED TO, READ BACK BY AND VERIFIED WITH: C SEAY,RN AT [id number] 07/31/15 BY L BENFIELD    Culture   Final    GRAM NEGATIVE RODS [facility]formed [facility]    Report Status PENDING  Incomplete     Studies: Dg Chest 2 View  07/30/2015  CLINICAL DATA:  Short of breath, hypertension, diabetes EXAM: CHEST  2 VIEW COMPARISON:  09/30/2014 FINDINGS: Stable enlarged cardiac silhouette. Lungs are hyperinflated. Small bilateral pleural effusions. Chronic bronchitic markings centrally similar to prior. IMPRESSION: Cardiomegaly and chronic bronchitic markings with hyperinflated lungs. Small pleural effusions. Electronically Signed   By: Genevive BiStewart  Edmunds M.D.   On: 07/30/2015 17:11   Ct Head Wo Contrast  07/30/2015  CLINICAL DATA:  Decreased level of consciousness for 2 days. Altered mental status. EXAM: CT HEAD WITHOUT CONTRAST TECHNIQUE: Contiguous axial images were obtained from the base of the skull through the vertex without intravenous contrast. COMPARISON:  CT head without contrast 09/30/2014. FINDINGS: Moderate atrophy and white matter changes are stable. No acute cortical infarct for hemorrhage is present. There is no mass lesion. The ventricles are proportionate to the degree of atrophy. No significant extra-axial fluid collection is present. A remote lacunar infarct is again noted within the right cerebellum. There is chronic opacification of the left globe. The orbits are otherwise within normal limits. The paranasal sinuses and mastoid air cells are  clear. The calvarium is intact. IMPRESSION: 1. No acute intracranial abnormality or significant interval change. 2. Stable moderate atrophy and white matter disease. Electronically Signed   By: Marin Robertshristopher  Mattern M.D.   On: 07/30/2015 17:15    Scheduled Meds: . allopurinol  100 mg Oral Daily  . aspirin EC  81 mg Oral Daily  . carvedilol  6.25 mg Oral BID WC  . cefTRIAXone (ROCEPHIN)  IV  2 g Intravenous QHS  . enoxaparin (LOVENOX) injection  30 mg Subcutaneous Q24H  . gabapentin  300 mg Oral q morning - 10a  . gabapentin  600 mg Oral QHS  . levothyroxine  25 mcg Oral Once per day on Sun Mon Wed Fri Sat  . [START ON  08/01/2015] levothyroxine  25 mcg Oral Once per day on Tue Thu  . potassium chloride  20 mEq Oral BID  . QUEtiapine  25 mg Oral QHS  . vancomycin  1,000 mg Intravenous Q24H   Continuous Infusions:  Antibiotics Given (last 72 hours)    Date/Time Action Medication Dose Rate   07/30/15 2219 Given   cefTRIAXone (ROCEPHIN) 2 g in dextrose 5 % 50 mL IVPB 2 g 100 mL/hr      Principal Problem:   Acute on chronic combined systolic and diastolic CHF, NYHA class 4 (HCC) Active Problems:   Essential hypertension   T2_NIDDM w/CKD4  (GFR 26 ml/min)   Acute-on-chronic kidney injury (HCC)   Elevated troponin   Elevated bilirubin   Hypokalemia   UTI (lower urinary tract infection)   Pressure ulcer    Time spent:    [facility]  [facility] Pager [phone]. If 7PM-7AM, please contact night-coverage at [email or web address], password [facility] 07/31/2015, 10:34 AM  LOS: 1 day

## 2015-08-01 ENCOUNTER — Encounter: Payer: Self-pay | Admitting: Physician Assistant

## 2015-08-01 DIAGNOSIS — Z66 Do not resuscitate: Secondary | ICD-10-CM

## 2015-08-01 DIAGNOSIS — G894 Chronic pain syndrome: Secondary | ICD-10-CM

## 2015-08-01 DIAGNOSIS — Z515 Encounter for palliative care: Secondary | ICD-10-CM

## 2015-08-01 LAB — CBC
HCT: 32.3 % — ABNORMAL LOW (ref 36.0–46.0)
Hemoglobin: 10.5 g/dL — ABNORMAL LOW (ref 12.0–15.0)
MCH: 32.2 pg (ref 26.0–34.0)
MCHC: 32.5 g/dL (ref 30.0–36.0)
MCV: 99.1 fL (ref 78.0–100.0)
PLATELETS: 160 10*3/uL (ref 150–400)
RBC: 3.26 MIL/uL — AB (ref 3.87–5.11)
RDW: 15 % (ref 11.5–15.5)
WBC: 6.9 10*3/uL (ref 4.0–10.5)

## 2015-08-01 LAB — BASIC METABOLIC PANEL
Anion gap: 10 (ref 5–15)
BUN: 41 mg/dL — AB (ref 6–20)
CHLORIDE: 100 mmol/L — AB (ref 101–111)
CO2: 31 mmol/L (ref 22–32)
CREATININE: 2.02 mg/dL — AB (ref 0.44–1.00)
Calcium: 8.3 mg/dL — ABNORMAL LOW (ref 8.9–10.3)
GFR calc Af Amer: 24 mL/min — ABNORMAL LOW (ref >60)
GFR calc non Af Amer: 20 mL/min — ABNORMAL LOW (ref >60)
GLUCOSE: 116 mg/dL — AB (ref 65–99)
Potassium: 4 mmol/L (ref 3.5–5.1)
Sodium: 141 mmol/L (ref 135–145)

## 2015-08-01 LAB — GLUCOSE, CAPILLARY: Glucose-Capillary: 106 mg/dL — ABNORMAL HIGH (ref 65–99)

## 2015-08-01 MED ORDER — MORPHINE SULFATE (CONCENTRATE) 10 MG/0.5ML PO SOLN
5.0000 mg | Freq: Four times a day (QID) | ORAL | Status: DC
  Administered 2015-08-01 – 2015-08-02 (×3): 5 mg via SUBLINGUAL
  Filled 2015-08-01 (×3): qty 0.5

## 2015-08-01 MED ORDER — MORPHINE SULFATE (CONCENTRATE) 10 MG/0.5ML PO SOLN
5.0000 mg | ORAL | Status: DC | PRN
  Administered 2015-08-01: 5 mg via ORAL
  Filled 2015-08-01: qty 0.5

## 2015-08-01 MED ORDER — LORAZEPAM 1 MG PO TABS
1.0000 mg | ORAL_TABLET | Freq: Four times a day (QID) | ORAL | Status: DC | PRN
  Administered 2015-08-03: 1 mg via ORAL
  Filled 2015-08-01: qty 1

## 2015-08-01 NOTE — Consult Note (Signed)
Consultation Note Date: 08/01/2015   Patient Name: [name]  DOB: 02/02/1922  MRN: [id number]  Age / Sex: 81 y.o., female  PCP: William McKeown, MD Referring Physician: Preetha Joseph, MD  Reason for Consultation: Establishing goals of care    Clinical Assessment/Narrative:  81 y.o. female with a past medical history significant for dementia, CHF with last EF 20-25% in 2015, single kidney with CKD III baseline eGFR 39/Cr 1.4, HTN, NIDDM diet-controlled, and gout who presents with decline.  Per family patient has had continued physical, functional and cognitive decline over the past year.   Over the last week, the patient has had a significant decline. She lives with her daughter and about 10 days ago, she had several days of confusion and weakness and breathlessness.  Now, over the weekend, the patient has again gotten weak. She has chronic sundowning for which she is on Seroquel for the last 3 weeks, but she has been sleeping during the day and more altered/confused at night than usual. Her daughter has noted leg swelling, panting and dyspnea, mounting confusion, and URI symptoms as well as decreased oral intake ("she is taking nothing at all to eat or drink").   In the ED, the patient had a low grade fever and hypoxia. There was AKI, elevated bilirubin, slight leukocytosis, elevated troponin, and markedly elevatd BNP. A chest x-ray showed pulmonary edema and UA showed pyuria and bacteriuria. Vancomycin and piperacillin-tazobactam were administered for possible sepsis without source.  Admitted for further work-up and treatment     This NP   reviewed medical records, received report from team, assessed the patient and then meet at the patient's bedside along with her daughter [name] and her husband [name]  to discuss diagnosis, prognosis, GOC, EOL wishes disposition and options.   A  detailed discussion was had today regarding advanced directives.  Concepts specific to code status, artifical feeding and hydration, continued IV antibiotics and rehospitalization was had.  The difference between a aggressive medical intervention path  and a palliative comfort care path for this patient at this time was had.  Values and goals of care important to patient and family were attempted to be elicited.  Concept of Hospice and Palliative Care were discussed  Natural trajectory and expectations at EOL were discussed.  Questions and concerns addressed.  Hard Choices booklet left for review. Family encouraged to call with questions or concerns.  PMT will continue to support holistically.   Contacts/Participants in Discussion: Primary Decision Maker: daughter Beth  HCPOA: yes    SUMMARY OF RECOMMENDATIONS  -comfort, quality and dignity - no life prolonging interventions patient has clearly documented desire for a natural death  Code Status/Advance Care Planning: DNR    Code Status Orders        Start     Ordered   07/30/15 2058  Do not attempt resuscitation (DNR)   Continuous    Question Answer Comment  In the event of cardiac or respiratory ARREST Do not call a "code blue"   In the event of cardiac or respiratory ARREST Do not perform Intubation, CPR, defibrillation or ACLS   In the event of cardiac or respiratory ARREST Use medication by any route, position, wound care, and other measures to relive pain and suffering. May use oxygen, suction and manual treatment of airway obstruction as needed for comfort.      07/30/15 2057    Advance Directive Documentation        Most Recent Value     Type of Advance Directive  Healthcare Power of Attorney   Pre-existing out of facility DNR order (yellow form or pink MOST form)     "MOST" Form in Place?        Other Directives:Advanced Directive and Living Will  Symptom Management:   Chronic pain: scheduled Roxanol 5 mg every 6  hrs po/sl, do not hold for sedation, focus of care is comfort  Incontinence: place foley cath per family request  Palliative Prophylaxis:   Aspiration, Bowel Regimen, Delirium Protocol, Frequent Pain Assessment and Oral Care  Additional Recommendations (Limitations, Scope, Preferences):  Full Comfort Care   Psycho-social/Spiritual:  Support System: Strong Desire for further Chaplaincy support:no Additional Recommendations: Education on Hospice and Grief/Bereavement Support  Prognosis: < 2 weeks  Discharge Planning:  re-access in the morning for disposition options, family is hopeful for hospice facility   Chief Complaint/ Primary Diagnoses: Present on Admission:  . Acute on chronic combined systolic and diastolic CHF, NYHA class 4 ([place]) . Essential hypertension . Acute-on-chronic kidney injury ([place]) . Elevated troponin . Elevated bilirubin . Hypokalemia . UTI (lower urinary tract infection)  I have reviewed the medical record, interviewed the patient and family, and examined the patient. The following aspects are pertinent.  Past Medical History  Diagnosis Date  . Hyperlipidemia   . Hypertension   . Type II or unspecified type diabetes mellitus without mention of complication, not stated as uncontrolled   . Diabetic neuropathy ([place])   . Chronic kidney disease   . GERD (gastroesophageal reflux disease)   . Osteoporosis   . CHF (congestive heart failure) ([place])   . Cardiomyopathy [facility])    Social History   Social History  . Marital Status: Widowed    Spouse Name: N/A  . Number of Children: 2  . Years of Education: N/A   Social History Main Topics  . Smoking status: Never Smoker   . Smokeless tobacco: None  . Alcohol Use: Yes     Comment: Occ wine states she is going to stop  . Drug Use: No  . Sexual Activity: No   Other Topics Concern  . None   Social History Narrative   Family History  Problem Relation Age of Onset  . Heart disease Father   . Heart  disease Sister   . Diabetes Daughter   . Hypertension Son    Scheduled Meds: . QUEtiapine  25 mg Oral QHS   Continuous Infusions:  PRN Meds:.acetaminophen **OR** acetaminophen, LORazepam, morphine CONCENTRATE Medications Prior to Admission:  Prior to Admission medications   Medication Sig Start Date End Date Taking? Authorizing Provider  allopurinol (ZYLOPRIM) 100 MG tablet TAKE 1 TABLET (100 MG TOTAL) BY MOUTH DAILY. 07/17/15  Yes Unk Pinto, MD  aspirin EC 81 MG EC tablet Take 1 tablet (81 mg total) by mouth daily. 11/07/13  Yes Charlynne Cousins, MD  B Complex Vitamins (VITAMIN-B COMPLEX PO) Take 1 tablet by mouth every morning.    Yes Historical Provider, MD  carvedilol (COREG) 6.25 MG tablet Take 1 tablet (6.25 mg total) by mouth 2 (two) times daily with a meal. 12/09/14  Yes Lelon Perla, MD  Cholecalciferol (VITAMIN D) 2000 UNITS CAPS Take 1,000-2,000 Units by mouth 2 (two) times daily. Take 1 tablet in the morning and 2 tablets in the evening   Yes Historical Provider, MD  diphenhydrAMINE (BENADRYL) 25 MG tablet Take 25 mg by mouth every 6 (six) hours as needed for allergies.   Yes Historical Provider, MD  furosemide (LASIX) 40 MG tablet Take 40 mg by mouth every morning.  11/07/13  Yes Abraham Feliz Ortiz, MD  gabapentin (NEURONTIN) 300 MG capsule TAKE 1 CAPSULE BY MOUTH IN AM, THEN 1 TO 2 CAPSULES BY MOUTH AT BEDTIME FOR LEG PAINS 01/17/15  Yes Amanda Collier, PA-C  levothyroxine (SYNTHROID, LEVOTHROID) 50 MCG tablet TAKE 1/2 TABLET BY MOUTH DAILY EXCEPT TAKE 1 TABLET ON TUESDAY AND THURSDAY 06/17/15  Yes William McKeown, MD  OVER THE COUNTER MEDICATION Take 1 capsule by mouth 2 (two) times daily. Tumeric   Yes Historical Provider, MD  Probiotic Product (PROBIOTIC DAILY PO) Take 1 tablet by mouth every morning.    Yes Historical Provider, MD  QUEtiapine (SEROQUEL) 25 MG tablet 1/2-2 pills at night Patient taking differently: Take 25 mg by mouth at bedtime.  06/23/15  Yes Amanda  Collier, PA-C   Allergies  Allergen Reactions  . Ace Inhibitors Swelling  . Codeine Other (See Comments)    dysuria  . Morphine And Related Other (See Comments)    dysuria  . Vitamin C Other (See Comments)    dysuria  . Vitamin D Analogs Other (See Comments)    Large doses cause dysuria    Review of Systems  Unable to perform ROS   Physical Exam  Constitutional: Vital signs are normal. She appears lethargic. She appears ill.  Frail and elderly  Cardiovascular: Normal rate, regular rhythm and normal heart sounds.   Respiratory: She has decreased breath sounds in the right lower field and the left lower field.  Musculoskeletal:       Right shoulder: She exhibits decreased strength.  Neurological: She appears lethargic.  Skin: Skin is warm and dry.    Vital Signs: BP 115/39 mmHg  Pulse 67  Temp(Src) 97.6 F (36.4 C) (Axillary)  Resp 20  Ht 5' 1" (1.549 m)  Wt 71.1 kg (156 lb 12 oz)  BMI 29.63 kg/m2  SpO2 100%  SpO2: SpO2: 100 % O2 Device:SpO2: 100 % O2 Flow Rate: .O2 Flow Rate (L/min): 1 L/min  IO: Intake/output summary:  Intake/Output Summary (Last 24 hours) at 08/01/15 1056 Last data filed at 08/01/15 0900  Gross per 24 hour  Intake    290 ml  Output      0 ml  Net    290 ml    LBM: Last BM Date: 07/29/15 Baseline Weight: Weight: 90.719 kg (200 lb) Most recent weight: Weight: 71.1 kg (156 lb 12 oz)      Palliative Assessment/Data:    Additional Data Reviewed:  CBC:    Component Value Date/Time   WBC 6.9 08/01/2015 0559   HGB 10.5* 08/01/2015 0559   HCT 32.3* 08/01/2015 0559   PLT 160 08/01/2015 0559   MCV 99.1 08/01/2015 0559   NEUTROABS 10.2* 07/30/2015 1632   LYMPHSABS 0.8 07/30/2015 1632   MONOABS 0.8 07/30/2015 1632   EOSABS 0.0 07/30/2015 1632   BASOSABS 0.0 07/30/2015 1632   Comprehensive Metabolic Panel:    Component Value Date/Time   NA 141 08/01/2015 0559   K 4.0 08/01/2015 0559   CL 100* 08/01/2015 0559   CO2 31 08/01/2015  0559   BUN 41* 08/01/2015 0559   CREATININE 2.02* 08/01/2015 0559   CREATININE 1.53* 07/27/2015 1239   GLUCOSE 116* 08/01/2015 0559   CALCIUM 8.3* 08/01/2015 0559   AST 22 07/31/2015 0412   ALT 8* 07/31/2015 0412   ALKPHOS 44 07/31/2015 0412   BILITOT 0.9 07/31/2015 0412   PROT 6.5 07/31/2015 0412     ALBUMIN 3.5 07/31/2015 0412   Discussed with Dr Joseph  Time In: 1015 Time Out: 1130 Time Total: 75 min Greater than 50%  of this time was spent counseling and coordinating care related to the above assessment and plan.  Signed by: , , NP   W , NP  08/01/2015, 10:56 AM  Please contact Palliative Medicine Team phone at [phone] for questions and concerns.            

## 2015-08-01 NOTE — Progress Notes (Signed)
Pharmacist Heart Failure Core Measure Documentation  Assessment: [place] [name] has an EF documented as 30-35% on 07/31/2015 by ECHO.  Rationale: Heart failure patients with left ventricular systolic dysfunction (LVSD) and an EF < 40% should be prescribed an angiotensin converting enzyme inhibitor (ACEI) or angiotensin receptor blocker (ARB) at discharge unless a contraindication is documented in the medical record.  This patient is not currently on an ACEI or ARB for HF.  This note is being placed in the record in order to provide documentation that a contraindication to the use of these agents is present for this encounter.  ACE Inhibitor or Angiotensin Receptor Blocker is contraindicated (specify all that apply)  [x]   ACEI allergy AND ARB allergy []   Angioedema []   Moderate or severe aortic stenosis []   Hyperkalemia []   Hypotension []   Renal artery stenosis [x]   Worsening renal function, preexisting renal disease or dysfunction   Clance BollRunyon, Jordana Dugue 08/01/2015 1:33 PM

## 2015-08-01 NOTE — Progress Notes (Signed)
[facility] PROGRESS NOTE  [place] Rob[place] UJW:[id number] DOB: November 28, 1921 DOA: 07/30/2015 PCP: Nadean CorwinMCKEOWN,WILLIAM DAVID, MD  Assessment/Plan: 1. Acute on Chronic Systolic CHF -EF is 30%, baseline around 25% -Continue IV Lasix , KCl, coreg -Conservative management only -further plans based on palliative meeting today  2. Ecoli UTI with sepsis -Improving, blood cultures with EColi, urine cultures pending -Continue ceftriaxone, follow-up cultures  3. AKi on CKD 3 -baseline creatinine 1.4 - due to sepsis, cardiorenal syndrome - monitor with diuresis  4. Elevated troponin -due to Demand/CHF/Sepsis -continue ASA/Coreg, no need for Cards consult, conservative management only  5. DM -SSI  6. Dementia/Sundowning -failure to thrive, on seroquel QHS -Palliative consult for Goals of care, daughter wanting this for End of life discussions, Family meeting with Lorinda CreedMary Larach NP now  7. Gout -Continue allopurinol  8.  Hypothyroidism: -Recent TSH normal. -Contniue levothyroxine  DVt proph: lovenox  Code Status: DNR Family Communication: daughter at bedside, Mills KollerBeth Arlene taylor [phone] Disposition Plan: to be determined, Pallaitive meeting today   Consultants:  HPI/Subjective: Looks better than yesterday per daughter, ate some breakfast this am  Objective: Filed Vitals:   07/31/15 2148 08/01/15 0452  BP: 136/50 115/39  Pulse: 65 67  Temp: 97.7 F (36.5 C) 97.6 F (36.4 C)  Resp: 18 20    Intake/Output Summary (Last 24 hours) at 08/01/15 1123 Last data filed at 08/01/15 0908  Gross per 24 hour  Intake    290 ml  Output      0 ml  Net    290 ml   Filed Weights   07/30/15 2100 07/31/15 0500 08/01/15 0452  Weight: 70.2 kg (154 lb 12.2 oz) 70.2 kg (154 lb 12.2 oz) 71.1 kg (156 lb 12 oz)    Exam:   General:  Alert, awake, oriented to self, answers simple questions  Cardiovascular:S1S2/RRR  Respiratory: decreased BS at bases  Abdomen: soft, NT, BS  present  Musculoskeletal:trace edema   Data Reviewed: Basic Metabolic Panel:  Recent Labs Lab 07/27/15 1239 07/30/15 1632 07/30/15 2200 07/31/15 0412 08/01/15 0559  NA 140 141  --  141 141  K 3.6 2.8*  --  3.6 4.0  CL 97* 98*  --  96* 100*  CO2 34* 30  --  32 31  GLUCOSE 147* 166*  --  143* 116*  BUN 24 29*  --  34* 41*  CREATININE 1.53* 1.77*  --  1.96* 2.02*  CALCIUM 8.7 8.6*  --  8.6* 8.3*  MG  --   --  1.8  --   --    Liver Function Tests:  Recent Labs Lab 07/27/15 1239 07/30/15 1632 07/31/15 0412  AST 10 18 22   ALT 4* 8* 8*  ALKPHOS 49 46 44  BILITOT 0.6 1.5* 0.9  PROT 6.0* 6.5 6.5  ALBUMIN 3.5* 3.5 3.5   No results for input(s): LIPASE, AMYLASE in the last 168 hours. No results for input(s): AMMONIA in the last 168 hours. CBC:  Recent Labs Lab 07/27/15 1239 07/30/15 1632 07/31/15 0412 08/01/15 0559  WBC 6.2 11.8* 10.6* 6.9  NEUTROABS 4.3 10.2*  --   --   HGB 12.6 12.0 12.0 10.5*  HCT 38.6 37.3 37.2 32.3*  MCV 98.0 99.5 98.2 99.1  PLT 247 211 201 160   Cardiac Enzymes:  Recent Labs Lab 07/30/15 2200 07/31/15 0412 07/31/15 0910  TROPONINI 0.40* 0.72* 0.59*   BNP (last 3 results)  Recent Labs  07/30/15 1643  BNP 1298.0*    ProBNP (  last 3 results) No results for input(s): PROBNP in the last 8760 hours.  CBG:  Recent Labs Lab 07/31/15 0735 07/31/15 1225 07/31/15 1716 07/31/15 2145  GLUCAP 132* 191* 121* 143*    Recent Results (from the past 240 hour(s))  Urine culture     Status: None (Preliminary result)   Collection Time: 07/30/15  4:15 PM  Result Value Ref Range Status   Specimen Description URINE, RANDOM  Final   Special Requests NONE  Final   Culture   Final    TOO YOUNG TO READ Performed at [facility]    Report Status PENDING  Incomplete  Culture, blood (routine x 2)     Status: None (Preliminary result)   Collection Time: 07/30/15  4:43 PM  Result Value Ref Range Status   Specimen Description BLOOD  LEFT WRIST  Final   Special Requests BOTTLES DRAWN AEROBIC AND ANAEROBIC 5CC  Final   Culture  Setup Time   Final    GRAM NEGATIVE RODS AEROBIC BOTTLE ONLY CRITICAL RESULT CALLED TO, READ BACK BY AND VERIFIED WITH: C SEAY,RN AT 4098 07/31/15 BY L BENFIELD    Culture   Final    ESCHERICHIA COLI Performed at [facility]    Report Status PENDING  Incomplete  Culture, blood (routine x 2)     Status: None (Preliminary result)   Collection Time: 07/30/15  4:43 PM  Result Value Ref Range Status   Specimen Description BLOOD LEFT ANTECUBITAL  Final   Special Requests BOTTLES DRAWN AEROBIC AND ANAEROBIC 5CC  Final   Culture  Setup Time   Final    GRAM NEGATIVE RODS AEROBIC BOTTLE ONLY CRITICAL RESULT CALLED TO, READ BACK BY AND VERIFIED WITH: C SEAY,RN AT 1191 07/31/15 BY L BENFIELD    Culture   Final    ESCHERICHIA COLI Performed at [facility]    Report Status PENDING  Incomplete     Studies: Dg Chest 2 View  07/30/2015  CLINICAL DATA:  Short of breath, hypertension, diabetes EXAM: CHEST  2 VIEW COMPARISON:  09/30/2014 FINDINGS: Stable enlarged cardiac silhouette. Lungs are hyperinflated. Small bilateral pleural effusions. Chronic bronchitic markings centrally similar to prior. IMPRESSION: Cardiomegaly and chronic bronchitic markings with hyperinflated lungs. Small pleural effusions. Electronically Signed   By: Genevive Bi M.D.   On: 07/30/2015 17:11   Ct Head Wo Contrast  07/30/2015  CLINICAL DATA:  Decreased level of consciousness for 2 days. Altered mental status. EXAM: CT HEAD WITHOUT CONTRAST TECHNIQUE: Contiguous axial images were obtained from the base of the skull through the vertex without intravenous contrast. COMPARISON:  CT head without contrast 09/30/2014. FINDINGS: Moderate atrophy and white matter changes are stable. No acute cortical infarct for hemorrhage is present. There is no mass lesion. The ventricles are proportionate to the degree of atrophy.  No significant extra-axial fluid collection is present. A remote lacunar infarct is again noted within the right cerebellum. There is chronic opacification of the left globe. The orbits are otherwise within normal limits. The paranasal sinuses and mastoid air cells are clear. The calvarium is intact. IMPRESSION: 1. No acute intracranial abnormality or significant interval change. 2. Stable moderate atrophy and white matter disease. Electronically Signed   By: Marin Roberts M.D.   On: 07/30/2015 17:15    Scheduled Meds: . QUEtiapine  25 mg Oral QHS   Continuous Infusions:  Antibiotics Given (last 72 hours)    Date/Time Action Medication Dose Rate   07/30/15 2219 Given  cefTRIAXone (ROCEPHIN) 2 g in dextrose 5 % 50 mL IVPB 2 g 100 mL/hr   07/31/15 2229 Given   cefTRIAXone (ROCEPHIN) 2 g in dextrose 5 % 50 mL IVPB 2 g 100 mL/hr      Principal Problem:   Acute on chronic combined systolic and diastolic CHF, NYHA class 4 ([facility]) Active Problems:   Essential hypertension   T2_NIDDM w/CKD4  (GFR 26 ml/min)   Acute-on-chronic kidney injury ([facility])   Elevated troponin   Elevated bilirubin   Hypokalemia   UTI (lower urinary tract infection)   Pressure ulcer    Time spent:    [facility]  [facility] Pager [phone]. If 7PM-7AM, please contact night-coverage at [email or web address], password [facility] 08/01/2015, 11:23 AM  LOS: 2 days

## 2015-08-01 NOTE — Progress Notes (Signed)
Nutrition Brief Note  Patient identified on the low [place]aden report screening  Wt Readings from Last 15 Encounters:  08/01/15 156 lb 12 oz (71.1 kg)  07/27/15 158 lb (71.668 kg)  05/17/15 160 lb 9.6 oz (72.848 kg)  01/03/15 167 lb (75.751 kg)  11/21/14 162 lb 14.4 oz (73.891 kg)  09/16/14 157 lb 12.8 oz (71.578 kg)  06/10/14 153 lb (69.4 kg)  05/24/14 148 lb 8 oz (67.359 kg)  02/28/14 138 lb (62.596 kg)  02/07/14 138 lb 12.8 oz (62.959 kg)  01/07/14 134 lb (60.782 kg)  12/21/13 143 lb 3.2 oz (64.955 kg)  12/06/13 140 lb 6.4 oz (63.685 kg)  11/22/13 140 lb (63.504 kg)  11/15/13 139 lb (63.05 kg)    Body mass index is 29.63 kg/(m^2). Patient meets criteria for overweight/borderline obesity based on current BMI.   Per MD note this AM, plan for meeting with Palliative Care as pt's daughter is requesting for EOL measures. Current medications reflective of comfort/hospice care approach.   Current diet order is Heart Healthy/Carb Modified, patient is consuming approximately 25% of meals at this time. Labs and medications reviewed.   No nutrition interventions warranted at this time. If nutrition issues arise, please consult RD.      Trenton GammonJessica Giana Castner, RD, LDN Inpatient Clinical Dietitian Pager # [phone] After hours/weekend pager # [phone]

## 2015-08-02 ENCOUNTER — Telehealth: Payer: Self-pay | Admitting: Internal Medicine

## 2015-08-02 DIAGNOSIS — Z515 Encounter for palliative care: Secondary | ICD-10-CM

## 2015-08-02 LAB — CULTURE, BLOOD (ROUTINE X 2)

## 2015-08-02 LAB — URINE CULTURE

## 2015-08-02 LAB — GLUCOSE, CAPILLARY: Glucose-Capillary: 87 mg/dL (ref 65–99)

## 2015-08-02 MED ORDER — MORPHINE SULFATE (CONCENTRATE) 10 MG/0.5ML PO SOLN
5.0000 mg | ORAL | Status: DC
  Administered 2015-08-02 – 2015-08-03 (×6): 5 mg via SUBLINGUAL
  Filled 2015-08-02 (×7): qty 0.5

## 2015-08-02 MED ORDER — MORPHINE SULFATE (CONCENTRATE) 10 MG/0.5ML PO SOLN: 5.0000 mg | ORAL | Status: AC

## 2015-08-02 MED ORDER — QUETIAPINE FUMARATE 25 MG PO TABS: 25.0000 mg | ORAL_TABLET | Freq: Every day | ORAL | Status: AC

## 2015-08-02 MED ORDER — LORAZEPAM 1 MG PO TABS: 1.0000 mg | ORAL_TABLET | Freq: Four times a day (QID) | ORAL | Status: AC | PRN

## 2015-08-02 NOTE — Progress Notes (Signed)
Daily Progress Note   Patient Name: [name]       Date: 08/02/2015 DOB: 09-22-1921  Age: 81 y.o. MRN#: [id number] Attending Physician: Zannie Cove, MD Primary Care Physician: Nadean Corwin, MD Admit Date: 07/30/2015  Reason for Consultation/Follow-up: Establishing goals of care  Subjective:  -family is comfortable with decision to focus on comfort, patient continues to c/o of generalized pain even with scheduled medications; will make adjustments  -continued decline, decreased  po intake since yesterday afternoon/ only sips of water, less responsive and periods of confusion  -family presented document endorses desire for a natural death  Length of Stay: 3 days  Current Medications: Scheduled Meds:  . morphine CONCENTRATE  5 mg Sublingual Q4H  . QUEtiapine  25 mg Oral QHS    Continuous Infusions:    PRN Meds: acetaminophen **OR** acetaminophen, LORazepam  Physical Exam: Physical Exam  Constitutional: She appears well-developed. She appears lethargic.  HENT:  Mouth/Throat: Oropharynx is clear and moist.  Pulmonary/Chest: She has decreased breath sounds in the right lower field and the left lower field.  Abdominal: Soft. Normal appearance.  Neurological: She appears lethargic.                Vital Signs: BP 141/49 mmHg  Pulse 68  Temp(Src) 98.4 F (36.9 C) (Oral)  Resp 19  Ht  (1.549 m)  Wt 71.1 kg (156 lb 12 oz)  BMI 29.63 kg/m2  SpO2 97% SpO2: SpO2: 97 % O2 Device: O2 Device: Not Delivered O2 Flow Rate: O2 Flow Rate (L/min): 1 L/min  Intake/output summary:  Intake/Output Summary (Last 24 hours) at 08/02/15 1252 Last data filed at 08/02/15 0900  Gross per 24 hour  Intake    120 ml  Output    900 ml  Net   -780 ml   LBM: Last BM  Date: 07/29/15 Baseline Weight: Weight: 90.719 kg (200 lb) Most recent weight: Weight: 71.1 kg (156 lb 12 oz)       Palliative Assessment/Data:   Additional Data Reviewed: CBC    Component Value Date/Time   WBC 6.9 08/01/2015 0559   RBC 3.26* 08/01/2015 0559   HGB 10.5* 08/01/2015 0559   HCT 32.3* 08/01/2015 0559   PLT 160 08/01/2015 0559   MCV 99.1 08/01/2015 0559   MCH 32.2 08/01/2015 0559  MCHC 32.5 08/01/2015 0559   RDW 15.0 08/01/2015 0559   LYMPHSABS 0.8 07/30/2015 1632   MONOABS 0.8 07/30/2015 1632   EOSABS 0.0 07/30/2015 1632   BASOSABS 0.0 07/30/2015 1632    CMP     Component Value Date/Time   NA 141 08/01/2015 0559   K 4.0 08/01/2015 0559   CL 100* 08/01/2015 0559   CO2 31 08/01/2015 0559   GLUCOSE 116* 08/01/2015 0559   BUN 41* 08/01/2015 0559   CREATININE 2.02* 08/01/2015 0559   CREATININE 1.53* 07/27/2015 1239   CALCIUM 8.3* 08/01/2015 0559   PROT 6.5 07/31/2015 0412   ALBUMIN 3.5 07/31/2015 0412   AST 22 07/31/2015 0412   ALT 8* 07/31/2015 0412   ALKPHOS 44 07/31/2015 0412   BILITOT 0.9 07/31/2015 0412   GFRNONAA 20* 08/01/2015 0559   GFRNONAA 30* 07/27/2015 1239   GFRAA 24* 08/01/2015 0559   GFRAA 34* 07/27/2015 1239       Problem List:  Patient Active Problem List   Diagnosis Date Noted  . DNR (do not resuscitate) 08/01/2015  . Palliative care encounter 08/01/2015  . Chronic pain syndrome   . Pressure ulcer 07/31/2015  . Acute on chronic combined systolic and diastolic CHF, NYHA class 4 (HCC) 07/30/2015  . Acute-on-chronic kidney injury (HCC) 07/30/2015  . Elevated troponin 07/30/2015  . Elevated bilirubin 07/30/2015  . Hypokalemia 07/30/2015  . UTI (lower urinary tract infection) 07/30/2015  . Gout 05/17/2015  . Depression, controlled 09/18/2014  . T2_NIDDM w/CKD4  (GFR 26 ml/min) 09/16/2014  . Cardiomyopathy (HCC) 05/24/2014  . Vitamin D deficiency 02/27/2014  . Medication management 12/21/2013  . CHF (congestive heart  failure) (HCC) 11/03/2013  . Acute combined systolic and diastolic CHF, NYHA class 3 (HCC) 11/03/2013  . Hyperlipidemia   . Essential hypertension   . CKD stage 3 due to type 2 diabetes mellitus (HCC)   . Diabetic neuropathy (HCC)   . GERD (gastroesophageal reflux disease)   . Osteoporosis      Palliative Care Assessment & Plan    1.Code Status:  DNR    Code Status Orders        Start     Ordered   07/30/15 2058  Do not attempt resuscitation (DNR)   Continuous    Question Answer Comment  In the event of cardiac or respiratory ARREST Do not call a "code blue"   In the event of cardiac or respiratory ARREST Do not perform Intubation, CPR, defibrillation or ACLS   In the event of cardiac or respiratory ARREST Use medication by any route, position, wound care, and other measures to relive pain and suffering. May use oxygen, suction and manual treatment of airway obstruction as needed for comfort.      07/30/15 2057    Advance Directive Documentation        Most Recent Value   Type of Advance Directive  Healthcare Power of Attorney   Pre-existing out of facility DNR order (yellow form or pink MOST form)     "MOST" Form in Place?         2. Goals of Care/Additional Recommendations:   Limitations on Scope of Treatment: Full Comfort Care  Desire for further Chaplaincy support:no  Psycho-social Needs: Education on Hospice and Grief/Bereavement Support  3. Symptom Management:      1. Pain/Dyspnea: Roxanol 5 mg po/sl every 4 hrs scheduled  4. Palliative Prophylaxis:   Bowel Regimen, Frequent Pain Assessment and Oral Care  5. Prognosis: < 2 weeks,  hydration and antibiotics have been stopped  6. Discharge Planning:  Hospice facility   Care plan was discussed with Dr Jomarie Longs  Thank you for allowing the Palliative Medicine Team to assist in the care of this patient.   Time In:  1300 Time Out:  1325 Total Time 25 min Prolonged Time Billed  no         Canary Brim, NP  08/02/2015, 12:52 PM  Please contact Palliative Medicine Team phone at [phone] for questions and concerns.

## 2015-08-02 NOTE — Discharge Summary (Signed)
Physician Discharge Summary  [place] UTM:[id number] DOB: [birth year] DOA: 07/30/2015  PCP: Alesia Richards, MD  Admit date: 07/30/2015 Discharge date: 08/02/2015  Time spent: 45 minutes  Recommendations for Outpatient Follow-up:  1. [facility] for End of life care   Discharge Diagnoses:  Principal Problem:   Acute on chronic combined systolic and diastolic CHF, NYHA class 4 (HCC)   Ecoli UTI with sepsis   Sepsis   Metabolic encephalopathy   Essential hypertension   T2_NIDDM w/CKD4  (GFR 26 ml/min)   Acute-on-chronic kidney injury ([place])   Elevated troponin   Elevated bilirubin   Hypokalemia   UTI (lower urinary tract infection)   Pressure ulcer   DNR (do not resuscitate)   Palliative care encounter   Chronic pain syndrome   Malnutrition, moderate degree  Discharge Condition: poor  Diet recommendation: comfort feeds as tolerated  Filed Weights   07/30/15 2100 07/31/15 0500 08/01/15 0452  Weight: 70.2 kg (154 lb 12.2 oz) 70.2 kg (154 lb 12.2 oz) 71.1 kg (156 lb 12 oz)    History of present illness:  Chief Complaint: Weakness and delirium HPI: [name] is a 81 y.o. female with a past medical history significant for dementia, CHF with last EF 20-25% in 2015, single kidney with CKD III baseline eGFR 39/Cr 1.4, HTN, NIDDM diet-controlled, and gout who presented with decline. Over the last week, the patient has had a decline. She lives with her daughter and about 10 days ago, she had several days of confusion and weakness and breathlessness. This was bad enough that family called 9-1-1, but then cancelled it because she seemed to be doing better. Over the weekend, the patient has again gotten weak. She has chronic sundowning for which she is on Seroquel for the last 3 weeks, but she has been sleeping during the day and more altered/confused at night than usual. Her daughter has noted leg swelling, panting and dyspnea, mounting confusion, and  URI symptoms as well as decreased oral intake ("nothing at all to eat or drink").   Hospital Course:  Upon admission, found to have Acute on chronic systolic CHF, EF 56%, Ecoli UTi with sepsis, Dementia, metabolic encephalopathy, AKi on CKD 3, Failure to thrive with minimal PO intake. Subsequently Palliative medicine was consulted after discussion with daughter and goals of care meeting took place on 12/27 and decision was made for Full comfort care and all other medicines, antibiotics, labs were stopped and she was started on Po Roxanol for pain and air hunger, she will be transitioned to [facility] for End of life care   Consultations:  Palliative medicine  Discharge Exam: Filed Vitals:   08/02/15 0504 08/02/15 1115  BP: 130/58 141/49  Pulse: 69 68  Temp: 98.1 F (36.7 C) 98.4 F (36.9 C)  Resp: 18 19    General: somnolent arousable  Cardiovascular: S1S2/RRR Respiratory: decreased BS at bases  Discharge Instructions    Current Discharge Medication List    START taking these medications   Details  LORazepam (ATIVAN) 1 MG tablet Take 1 tablet (1 mg total) by mouth every 6 (six) hours as needed for anxiety. Qty: 30 tablet, Refills: 0    Morphine Sulfate (MORPHINE CONCENTRATE) 10 MG/0.5ML SOLN concentrated solution Place 0.25 mLs (5 mg total) under the tongue every 4 (four) hours. Qty: 42 mL, Refills: 0      CONTINUE these medications which have CHANGED   Details  QUEtiapine (SEROQUEL) 25 MG tablet Take 1 tablet (25 mg total)  by mouth at bedtime.      CONTINUE these medications which have NOT CHANGED   Details  gabapentin (NEURONTIN) 300 MG capsule TAKE 1 CAPSULE BY MOUTH IN AM, THEN 1 TO 2 CAPSULES BY MOUTH AT BEDTIME FOR LEG PAINS Qty: 90 capsule, Refills: 5      STOP taking these medications     allopurinol (ZYLOPRIM) 100 MG tablet      aspirin EC 81 MG EC tablet      B Complex Vitamins (VITAMIN-B COMPLEX PO)      carvedilol (COREG) 6.25 MG  tablet      Cholecalciferol (VITAMIN D) 2000 UNITS CAPS      diphenhydrAMINE (BENADRYL) 25 MG tablet      furosemide (LASIX) 40 MG tablet      levothyroxine (SYNTHROID, LEVOTHROID) 50 MCG tablet      OVER THE COUNTER MEDICATION      Probiotic Product (PROBIOTIC DAILY PO)        Allergies  Allergen Reactions  . Ace Inhibitors Swelling  . Codeine Other (See Comments)    dysuria  . Morphine And Related Other (See Comments)    dysuria  . Vitamin C Other (See Comments)    dysuria  . Vitamin D Analogs Other (See Comments)    Large doses cause dysuria   Follow-up Information    Follow up with MCKEOWN,WILLIAM DAVID, MD. Schedule an appointment as soon as possible for a visit in 1 week.   Specialty:  Internal Medicine   Contact information:   [address] [place] [address] [phone]        The results of significant diagnostics from this hospitalization (including imaging, microbiology, ancillary and laboratory) are listed below for reference.    Significant Diagnostic Studies: Dg Chest 2 View  07/30/2015  CLINICAL DATA:  Short of breath, hypertension, diabetes EXAM: CHEST  2 VIEW COMPARISON:  09/30/2014 FINDINGS: Stable enlarged cardiac silhouette. Lungs are hyperinflated. Small bilateral pleural effusions. Chronic bronchitic markings centrally similar to prior. IMPRESSION: Cardiomegaly and chronic bronchitic markings with hyperinflated lungs. Small pleural effusions. Electronically Signed   By: Genevive Bi M.D.   On: 07/30/2015 17:11   Dg Shoulder 1v Right  07/03/2015  CLINICAL DATA:  Status post fall over a week ago. EXAM: RIGHT SHOULDER - 1 VIEW COMPARISON:  None. FINDINGS: There is no fracture or dislocation. There is loss of the normal acromiohumeral distance as can be seen with a rotator cuff tear. There are mild degenerative changes of the acromioclavicular joint. IMPRESSION: No acute osseous injury of the right shoulder on a single view.  Electronically Signed   By: Elige Ko   On: 07/03/2015 18:52   Ct Head Wo Contrast  07/30/2015  CLINICAL DATA:  Decreased level of consciousness for 2 days. Altered mental status. EXAM: CT HEAD WITHOUT CONTRAST TECHNIQUE: Contiguous axial images were obtained from the base of the skull through the vertex without intravenous contrast. COMPARISON:  CT head without contrast 09/30/2014. FINDINGS: Moderate atrophy and white matter changes are stable. No acute cortical infarct for hemorrhage is present. There is no mass lesion. The ventricles are proportionate to the degree of atrophy. No significant extra-axial fluid collection is present. A remote lacunar infarct is again noted within the right cerebellum. There is chronic opacification of the left globe. The orbits are otherwise within normal limits. The paranasal sinuses and mastoid air cells are clear. The calvarium is intact. IMPRESSION: 1. No acute intracranial abnormality or significant interval change. 2.  Stable moderate atrophy and white matter disease. Electronically Signed   By: San Morelle M.D.   On: 07/30/2015 17:15   Dg Humerus Right  07/03/2015  CLINICAL DATA:  Pain following fall EXAM: RIGHT HUMERUS - 2+ VIEW COMPARISON:  None. FINDINGS: Frontal and lateral views were obtained. There is no acute fracture or dislocation. There is moderate generalized osteoarthritic change in the right shoulder. There are scattered calcified phleboliths in the distal upper arm region. IMPRESSION: Osteoarthritic change right shoulder.  No fracture or dislocation. Electronically Signed   By: Lowella Grip III M.D.   On: 07/03/2015 18:51    Microbiology: Recent Results (from the past 240 hour(s))  Urine culture     Status: None   Collection Time: 07/30/15  4:15 PM  Result Value Ref Range Status   Specimen Description URINE, RANDOM  Final   Special Requests NONE  Final   Culture   Final    >=100,000 COLONIES/mL ESCHERICHIA COLI Performed at  [facility]    Report Status 08/02/2015 FINAL  Final   Organism ID, Bacteria ESCHERICHIA COLI  Final      Susceptibility   Escherichia coli - MIC*    AMPICILLIN 8 SENSITIVE Sensitive     CEFAZOLIN <=4 SENSITIVE Sensitive     CEFTRIAXONE <=1 SENSITIVE Sensitive     CIPROFLOXACIN <=0.25 SENSITIVE Sensitive     GENTAMICIN <=1 SENSITIVE Sensitive     IMIPENEM <=0.25 SENSITIVE Sensitive     NITROFURANTOIN <=16 SENSITIVE Sensitive     TRIMETH/SULFA <=20 SENSITIVE Sensitive     AMPICILLIN/SULBACTAM 4 SENSITIVE Sensitive     PIP/TAZO <=4 SENSITIVE Sensitive     * >=100,000 COLONIES/mL ESCHERICHIA COLI  Culture, blood (routine x 2)     Status: None   Collection Time: 07/30/15  4:43 PM  Result Value Ref Range Status   Specimen Description BLOOD LEFT WRIST  Final   Special Requests BOTTLES DRAWN AEROBIC AND ANAEROBIC 5CC  Final   Culture  Setup Time   Final    GRAM NEGATIVE RODS AEROBIC BOTTLE ONLY CRITICAL RESULT CALLED TO, READ BACK BY AND VERIFIED WITH: C SEAY,RN AT 4097 07/31/15 BY L BENFIELD    Culture   Final    ESCHERICHIA COLI SUSCEPTIBILITIES PERFORMED ON PREVIOUS CULTURE WITHIN THE LAST 5 DAYS. Performed at [facility]    Report Status 08/02/2015 FINAL  Final  Culture, blood (routine x 2)     Status: None   Collection Time: 07/30/15  4:43 PM  Result Value Ref Range Status   Specimen Description BLOOD LEFT ANTECUBITAL  Final   Special Requests BOTTLES DRAWN AEROBIC AND ANAEROBIC 5CC  Final   Culture  Setup Time   Final    GRAM NEGATIVE RODS AEROBIC BOTTLE ONLY CRITICAL RESULT CALLED TO, READ BACK BY AND VERIFIED WITH: C SEAY,RN AT 3532 07/31/15 BY L BENFIELD    Culture   Final    ESCHERICHIA COLI Performed at [facility]    Report Status 08/02/2015 FINAL  Final   Organism ID, Bacteria ESCHERICHIA COLI  Final      Susceptibility   Escherichia coli - MIC*    AMPICILLIN 4 SENSITIVE Sensitive     CEFAZOLIN <=4 SENSITIVE Sensitive     CEFEPIME <=1  SENSITIVE Sensitive     CEFTAZIDIME <=1 SENSITIVE Sensitive     CEFTRIAXONE <=1 SENSITIVE Sensitive     CIPROFLOXACIN <=0.25 SENSITIVE Sensitive     GENTAMICIN <=1 SENSITIVE Sensitive     IMIPENEM <=  0.25 SENSITIVE Sensitive     TRIMETH/SULFA <=20 SENSITIVE Sensitive     AMPICILLIN/SULBACTAM 4 SENSITIVE Sensitive     PIP/TAZO <=4 SENSITIVE Sensitive     * ESCHERICHIA COLI     Labs: Basic Metabolic Panel:  Recent Labs Lab 07/27/15 1239 07/30/15 1632 07/30/15 2200 07/31/15 0412 08/01/15 0559  NA 140 141  --  141 141  K 3.6 2.8*  --  3.6 4.0  CL 97* 98*  --  96* 100*  CO2 34* 30  --  32 31  GLUCOSE 147* 166*  --  143* 116*  BUN 24 29*  --  34* 41*  CREATININE 1.53* 1.77*  --  1.96* 2.02*  CALCIUM 8.7 8.6*  --  8.6* 8.3*  MG  --   --  1.8  --   --    Liver Function Tests:  Recent Labs Lab 07/27/15 1239 07/30/15 1632 07/31/15 0412  AST '10 18 22  '$ ALT 4* 8* 8*  ALKPHOS 49 46 44  BILITOT 0.6 1.5* 0.9  PROT 6.0* 6.5 6.5  ALBUMIN 3.5* 3.5 3.5   No results for input(s): LIPASE, AMYLASE in the last 168 hours. No results for input(s): AMMONIA in the last 168 hours. CBC:  Recent Labs Lab 07/27/15 1239 07/30/15 1632 07/31/15 0412 08/01/15 0559  WBC 6.2 11.8* 10.6* 6.9  NEUTROABS 4.3 10.2*  --   --   HGB 12.6 12.0 12.0 10.5*  HCT 38.6 37.3 37.2 32.3*  MCV 98.0 99.5 98.2 99.1  PLT 247 211 201 160   Cardiac Enzymes:  Recent Labs Lab 07/30/15 2200 07/31/15 0412 07/31/15 0910  TROPONINI 0.40* 0.72* 0.59*   BNP: BNP (last 3 results)  Recent Labs  07/30/15 1643  BNP 1298.0*    ProBNP (last 3 results) No results for input(s): PROBNP in the last 8760 hours.  CBG:  Recent Labs Lab 07/31/15 1225 07/31/15 1716 07/31/15 2145 08/01/15 0744 08/02/15 0722  GLUCAP 191* 121* 143* 106* 87       Signed:  Breya Cass MD  FACP  [facility] 08/02/2015, 5:49 PM

## 2015-08-02 NOTE — Clinical Documentation Improvement (Signed)
Internal Medicine  Can the diagnosis of pressure ulcer be further specified?   Document if pressure ulcer with stage is Present on Admission   Document Site with laterality - Elbow, Back (upper/lower), Sacral, Hip, Buttock, Ankle, Heel, Head, Other (Specify)  Pressure Ulcer Stage - Stage1, Stage 2, Stage 3, Stage 4, Unstageable, Unspecified, Unable to Clinically Determine  Other  Clinically Undetermined  Supporting Information: "Pressure ulcer "  Please update your documentation within the medical record to reflect your response to this query. Thank you.  Please exercise your independent, professional judgment when responding. A specific answer is not anticipated or expected.  Thank You, Nevin BloodgoodJoan B Donata Reddick, RN, BSN, CCDS,Clinical Documentation Specialist:  [phone]  [phone]=[facility] [place]- Health Information Management

## 2015-08-02 NOTE — Progress Notes (Signed)
[facility] PROGRESS NOTE  N[place]ada Cockrum ZOX:[id number] DOB: [birth year] DOA: 07/30/2015 PCP: Nadean Corwin, MD  Assessment/Plan: 1. Acute on Chronic Systolic CHF -EF is 30%, baseline around 25% -now comfort care  2. Ecoli UTI with sepsis -comfort care only  3. AKi on CKD 3 -baseline creatinine 1.4 - due to sepsis, cardiorenal syndrome - see above  4. Elevated troponin -due to Demand/CHF/Sepsis -see above  5. DM -SSI, as above  6. Dementia/Sundowning -failure to thrive, on seroquel QHS -now full comfort care only, plan for residential hospice   7. Gout -Continue allopurinol  8.  Hypothyroidism: -Recent TSH normal. -Contniue levothyroxine  DVt proph: lovenox  Code Status: DNR Family Communication: daughter at bedside, Mills Koller taylor [phone] Disposition Plan: For residential hospice   Consultants:  HPI/Subjective: hasnt eaten since yesterday   Objective: Filed Vitals:   08/02/15 0504 08/02/15 1115  BP: 130/58 141/49  Pulse: 69 68  Temp: 98.1 F (36.7 C) 98.4 F (36.9 C)  Resp: 18 19    Intake/Output Summary (Last 24 hours) at 08/02/15 1336 Last data filed at 08/02/15 0900  Gross per 24 hour  Intake      0 ml  Output    900 ml  Net   -900 ml   Filed Weights   07/30/15 2100 07/31/15 0500 08/01/15 0452  Weight: 70.2 kg (154 lb 12.2 oz) 70.2 kg (154 lb 12.2 oz) 71.1 kg (156 lb 12 oz)    Exam:   General:  Alert, awake, oriented to self, answers simple questions, more drowsy than yesterday  Cardiovascular:S1S2/RRR  Respiratory: decreased BS at bases, few ronchi  Abdomen: soft, NT, BS present  Musculoskeletal:trace edema   Data Reviewed: Basic Metabolic Panel:  Recent Labs Lab 07/27/15 1239 07/30/15 1632 07/30/15 2200 07/31/15 0412 08/01/15 0559  NA 140 141  --  141 141  K 3.6 2.8*  --  3.6 4.0  CL 97* 98*  --  96* 100*  CO2 34* 30  --  32 31  GLUCOSE 147* 166*  --  143* 116*  BUN 24 29*  --  34* 41*   CREATININE 1.53* 1.77*  --  1.96* 2.02*  CALCIUM 8.7 8.6*  --  8.6* 8.3*  MG  --   --  1.8  --   --    Liver Function Tests:  Recent Labs Lab 07/27/15 1239 07/30/15 1632 07/31/15 0412  AST ALT 4* 8* 8*  ALKPHOS 49 46 44  BILITOT 0.6 1.5* 0.9  PROT 6.0* 6.5 6.5  ALBUMIN 3.5* 3.5 3.5   No results for input(s): LIPASE, AMYLASE in the last 168 hours. No results for input(s): AMMONIA in the last 168 hours. CBC:  Recent Labs Lab 07/27/15 1239 07/30/15 1632 07/31/15 0412 08/01/15 0559  WBC 6.2 11.8* 10.6* 6.9  NEUTROABS 4.3 10.2*  --   --   HGB 12.6 12.0 12.0 10.5*  HCT 38.6 37.3 37.2 32.3*  MCV 98.0 99.5 98.2 99.1  PLT 247 211 201 160   Cardiac Enzymes:  Recent Labs Lab 07/30/15 2200 07/31/15 0412 07/31/15 0910  TROPONINI 0.40* 0.72* 0.59*   BNP (last 3 results)  Recent Labs  07/30/15 1643  BNP 1298.0*    ProBNP (last 3 results) No results for input(s): PROBNP in the last 8760 hours.  CBG:  Recent Labs Lab 07/31/15 1225 07/31/15 1716 07/31/15 2145 08/01/15 0744 08/02/15 0722  GLUCAP 191* 121* 143* 106* 87    Recent Results (from the past  240 hour(s))  Urine culture     Status: None   Collection Time: 07/30/15  4:15 PM  Result Value Ref Range Status   Specimen Description URINE, RANDOM  Final   Special Requests NONE  Final   Culture   Final    >=100,000 COLONIES/mL ESCHERICHIA COLI Performed at [facility]    Report Status 08/02/2015 FINAL  Final   Organism ID, Bacteria ESCHERICHIA COLI  Final      Susceptibility   Escherichia coli - MIC*    AMPICILLIN 8 SENSITIVE Sensitive     CEFAZOLIN <=4 SENSITIVE Sensitive     CEFTRIAXONE <=1 SENSITIVE Sensitive     CIPROFLOXACIN <=0.25 SENSITIVE Sensitive     GENTAMICIN <=1 SENSITIVE Sensitive     IMIPENEM <=0.25 SENSITIVE Sensitive     NITROFURANTOIN <=16 SENSITIVE Sensitive     TRIMETH/SULFA <=20 SENSITIVE Sensitive     AMPICILLIN/SULBACTAM 4 SENSITIVE Sensitive      PIP/TAZO <=4 SENSITIVE Sensitive     * >=100,000 COLONIES/mL ESCHERICHIA COLI  Culture, blood (routine x 2)     Status: None   Collection Time: 07/30/15  4:43 PM  Result Value Ref Range Status   Specimen Description BLOOD LEFT WRIST  Final   Special Requests BOTTLES DRAWN AEROBIC AND ANAEROBIC 5CC  Final   Culture  Setup Time   Final    GRAM NEGATIVE RODS AEROBIC BOTTLE ONLY CRITICAL RESULT CALLED TO, READ BACK BY AND VERIFIED WITH: C SEAY,RN AT 2130 07/31/15 BY L BENFIELD    Culture   Final    ESCHERICHIA COLI SUSCEPTIBILITIES PERFORMED ON PREVIOUS CULTURE WITHIN THE LAST 5 DAYS. Performed at [facility]    Report Status 08/02/2015 FINAL  Final  Culture, blood (routine x 2)     Status: None   Collection Time: 07/30/15  4:43 PM  Result Value Ref Range Status   Specimen Description BLOOD LEFT ANTECUBITAL  Final   Special Requests BOTTLES DRAWN AEROBIC AND ANAEROBIC 5CC  Final   Culture  Setup Time   Final    GRAM NEGATIVE RODS AEROBIC BOTTLE ONLY CRITICAL RESULT CALLED TO, READ BACK BY AND VERIFIED WITH: C SEAY,RN AT 8657 07/31/15 BY L BENFIELD    Culture   Final    ESCHERICHIA COLI Performed at [facility]    Report Status 08/02/2015 FINAL  Final   Organism ID, Bacteria ESCHERICHIA COLI  Final      Susceptibility   Escherichia coli - MIC*    AMPICILLIN 4 SENSITIVE Sensitive     CEFAZOLIN <=4 SENSITIVE Sensitive     CEFEPIME <=1 SENSITIVE Sensitive     CEFTAZIDIME <=1 SENSITIVE Sensitive     CEFTRIAXONE <=1 SENSITIVE Sensitive     CIPROFLOXACIN <=0.25 SENSITIVE Sensitive     GENTAMICIN <=1 SENSITIVE Sensitive     IMIPENEM <=0.25 SENSITIVE Sensitive     TRIMETH/SULFA <=20 SENSITIVE Sensitive     AMPICILLIN/SULBACTAM 4 SENSITIVE Sensitive     PIP/TAZO <=4 SENSITIVE Sensitive     * ESCHERICHIA COLI     Studies: No results found.  Scheduled Meds: . morphine CONCENTRATE  5 mg Sublingual Q4H  . QUEtiapine  25 mg Oral QHS   Continuous Infusions:   Antibiotics Given (last 72 hours)    Date/Time Action Medication Dose Rate   07/30/15 2219 Given   cefTRIAXone (ROCEPHIN) 2 g in dextrose 5 % 50 mL IVPB 2 g 100 mL/hr   07/31/15 2229 Given   cefTRIAXone (ROCEPHIN) 2 g in  dextrose 5 % 50 mL IVPB 2 g 100 mL/hr      Principal Problem:   Acute on chronic combined systolic and diastolic CHF, NYHA class 4 (HCC) Active Problems:   Essential hypertension   T2_NIDDM w/CKD4  (GFR 26 ml/min)   Acute-on-chronic kidney injury (HCC)   Elevated troponin   Elevated bilirubin   Hypokalemia   UTI (lower urinary tract infection)   Pressure ulcer   DNR (do not resuscitate)   Palliative care encounter   Chronic pain syndrome    Time spent: 35min    Eye Surgicenter Of New JerseyJOSEPH,Alizaya Oshea  [facility] Pager [phone]. If 7PM-7AM, please contact night-coverage at [email or web address], password [email or web address] 08/02/2015, 1:36 PM  LOS: 3 days

## 2015-08-02 NOTE — Care Management Important Message (Signed)
Important Message  Patient Details  Name: Ashley ShaggyVirginia H [name] MRN: [id number] Date of Birth: 16-Dec-1921   Medicare Important Message Given:  Yes    Haskell FlirtJamison, Flora Ratz 08/02/2015, 12:21 PM

## 2015-08-02 NOTE — Telephone Encounter (Signed)
called [name], [phone] ext [phone] at [facility] to cancel DME wheelchair order, patient admitted to Hospice.

## 2015-08-02 NOTE — Consult Note (Signed)
   [facility]   08/02/2015  [name] Mar 10, 1922 [id number]   [facility]. Reviewed Palliative Medicine Team notes. Hospice facility recommended. Spoke with inpatient RNCM. Not appropriate to engage for [facility] at this time.    Raiford NobleAtika Hall, MSN-Ed, RN,BSN [facility] Care Management Hospital Liaison [phone]

## 2015-08-02 NOTE — Clinical Social Work Note (Signed)
Clinical Social Work Assessment  Patient Details  Name: [name] MRN: [id number] Date of Birth: [birth year]  Date of referral:  08/02/15               Reason for consult:  Facility Placement                Permission sought to share information with:  Oceanographeracility Contact Representative Permission granted to share information::  Yes, Verbal Permission Granted  Name::        Agency::     Relationship::     Contact Information:     Housing/Transportation Living arrangements for the past 2 months:  Single Family Home Source of Information:  Adult Children Patient Interpreter Needed:  None Criminal Activity/Legal Involvement Pertinent to Current Situation/Hospitalization:  No - Comment as needed Significant Relationships:  Adult Children Lives with:  Adult Children Do you feel safe going back to the place where you live?  No Need for family participation in patient care:  Yes (Comment)  Care giving concerns:  CSW received consult from PMT NP, Corrie DandyMary that patient will need residential hospice facility placement.    Social Worker assessment / plan:  CSW spoke with patient's daughter, [name] at bedside to confirm plans for residential hospice at discharge.   Employment status:  Retired Health and safety inspectornsurance information:  Medicare PT Recommendations:  Not assessed at this time Information / Referral to community resources:   Warehouse manager(Residential Hospice Facility)  Patient/Family's Response to care:  Patient's [facility]. CSW made referral to Forrestine HimEva Davis, [facility] Liaison - will await response re: bed availability in the morning. CSW also sent referral to Drenda FreezeFran [facility].   Patient/Family's Understanding of and Emotional Response to Diagnosis, Current Treatment, and Prognosis:  Patient's daughter expressed that patient has been back and forth between going to the hospital and returning home but  that this hospitalization they have realized that residential hospice would be the best option for her.   Emotional Assessment Appearance:  Appears stated age Attitude/Demeanor/Rapport:    Affect (typically observed):    Orientation:  Oriented to Self Alcohol / Substance use:    Psych involvement (Current and /or in the community):     Discharge Needs  Concerns to be addressed:    Readmission within the last 30 days:    Current discharge risk:    Barriers to Discharge:      Arlyss RepressHarrison, Malic Rosten F, LCSW 08/02/2015, 3:07 PM

## 2015-08-03 ENCOUNTER — Telehealth: Payer: Self-pay | Admitting: Cardiology

## 2015-08-03 MED ORDER — MORPHINE SULFATE (CONCENTRATE) 10 MG/0.5ML PO SOLN
5.0000 mg | ORAL | Status: DC | PRN
  Administered 2015-08-03 (×3): 5 mg via SUBLINGUAL
  Filled 2015-08-03 (×2): qty 0.5

## 2015-08-03 MED ORDER — LIP MEDEX EX OINT
TOPICAL_OINTMENT | CUTANEOUS | Status: AC
  Administered 2015-08-03: 1
  Filled 2015-08-03: qty 7

## 2015-08-03 NOTE — Telephone Encounter (Signed)
Son called wanted you to know [facility],she will no longer be coming here and will not need appointments. Thank you for everything.

## 2015-08-03 NOTE — Progress Notes (Signed)
CSW [facility] with [name] this am. This facility has no availability today. [facility] contacted and will screen pt after 12 noon today for possible admission. CSW will update family and continue to follow to assist with d/c planning needs.  Cori RazorJamie Synethia Endicott LCSW [phone]

## 2015-08-03 NOTE — Progress Notes (Signed)
Patient will be transferred to 1603.  RN receiving patient was given report. Will call daughter Waynetta SandyBeth in the am to tell her of patient's transfer.

## 2015-08-03 NOTE — Progress Notes (Signed)
Daily Progress Note   Patient Name: [name]       Date: 08/03/2015 DOB: 1922-07-13  Age: 81 y.o. MRN#: [id number] Attending Physician: Rhetta Mura, MD Primary Care Physician: Nadean Corwin, MD Admit Date: 07/30/2015  Reason for Consultation/Follow-up: Establishing goals of care  Subjective:  -family is comfortable with decision to focus on comfort, continued conversation regarding natural trajectory and expectations at EOL  -continued decline, decreased  po intake since yesterday afternoon/ only sips of water, less responsive and periods of confusion  -daughter requested that I speak with the patient about transition to hospice facility.  I offered simple explanation to [name], she seems to understand but is really too weak to engage.  Offered emotional support and presence    Length of Stay: 4 days  Current Medications: Scheduled Meds:  . QUEtiapine  25 mg Oral QHS    Continuous Infusions:    PRN Meds: acetaminophen **OR** acetaminophen, LORazepam, morphine CONCENTRATE  Physical Exam: Physical Exam  Constitutional: She appears well-developed. She appears lethargic.  HENT:  Mouth/Throat: Oropharynx is clear and moist.  Pulmonary/Chest: She has decreased breath sounds in the right lower field and the left lower field.  Abdominal: Soft. Normal appearance.  Neurological: She appears lethargic.                Vital Signs: BP 143/55 mmHg  Pulse 64  Temp(Src) 97.8 F (36.6 C) (Oral)  Resp 20  Ht  (1.549 m)  Wt 71.6 kg (157 lb 13.6 oz)  BMI 29.84 kg/m2  SpO2 98% SpO2: SpO2: 98 % O2 Device: O2 Device: Nasal Cannula O2 Flow Rate: O2 Flow Rate (L/min): 2 L/min  Intake/output summary:   Intake/Output Summary (Last 24 hours) at  08/03/15 1000 Last data filed at 08/03/15 6578  Gross per 24 hour  Intake    120 ml  Output    950 ml  Net   -830 ml   LBM: Last BM Date: 07/29/15 Baseline Weight: Weight: 90.719 kg (200 lb) Most recent weight: Weight: 71.6 kg (157 lb 13.6 oz)       Palliative Assessment/Data:   Additional Data Reviewed: CBC    Component Value Date/Time   WBC 6.9 08/01/2015 0559   RBC 3.26* 08/01/2015 0559   HGB 10.5* 08/01/2015 0559   HCT 32.3*  08/01/2015 0559   PLT 160 08/01/2015 0559   MCV 99.1 08/01/2015 0559   MCH 32.2 08/01/2015 0559   MCHC 32.5 08/01/2015 0559   RDW 15.0 08/01/2015 0559   LYMPHSABS 0.8 07/30/2015 1632   MONOABS 0.8 07/30/2015 1632   EOSABS 0.0 07/30/2015 1632   BASOSABS 0.0 07/30/2015 1632    CMP     Component Value Date/Time   NA 141 08/01/2015 0559   K 4.0 08/01/2015 0559   CL 100* 08/01/2015 0559   CO2 31 08/01/2015 0559   GLUCOSE 116* 08/01/2015 0559   BUN 41* 08/01/2015 0559   CREATININE 2.02* 08/01/2015 0559   CREATININE 1.53* 07/27/2015 1239   CALCIUM 8.3* 08/01/2015 0559   PROT 6.5 07/31/2015 0412   ALBUMIN 3.5 07/31/2015 0412   AST 22 07/31/2015 0412   ALT 8* 07/31/2015 0412   ALKPHOS 44 07/31/2015 0412   BILITOT 0.9 07/31/2015 0412   GFRNONAA 20* 08/01/2015 0559   GFRNONAA 30* 07/27/2015 1239   GFRAA 24* 08/01/2015 0559   GFRAA 34* 07/27/2015 1239       Problem List:  Patient Active Problem List   Diagnosis Date Noted  . DNR (do not resuscitate) 08/01/2015  . Palliative care encounter 08/01/2015  . Chronic pain syndrome   . Pressure ulcer 07/31/2015  . Acute on chronic combined systolic and diastolic CHF, NYHA class 4 (HCC) 07/30/2015  . Acute-on-chronic kidney injury (HCC) 07/30/2015  . Elevated troponin 07/30/2015  . Elevated bilirubin 07/30/2015  . Hypokalemia 07/30/2015  . UTI (lower urinary tract infection) 07/30/2015  . Gout 05/17/2015  . Depression, controlled 09/18/2014  . T2_NIDDM w/CKD4  (GFR 26 ml/min) 09/16/2014    . Cardiomyopathy (HCC) 05/24/2014  . Vitamin D deficiency 02/27/2014  . Medication management 12/21/2013  . CHF (congestive heart failure) (HCC) 11/03/2013  . Acute combined systolic and diastolic CHF, NYHA class 3 (HCC) 11/03/2013  . Hyperlipidemia   . Essential hypertension   . CKD stage 3 due to type 2 diabetes mellitus (HCC)   . Diabetic neuropathy (HCC)   . GERD (gastroesophageal reflux disease)   . Osteoporosis      Palliative Care Assessment & Plan    1.Code Status:  DNR    Code Status Orders        Start     Ordered   07/30/15 2058  Do not attempt resuscitation (DNR)   Continuous    Question Answer Comment  In the event of cardiac or respiratory ARREST Do not call a "code blue"   In the event of cardiac or respiratory ARREST Do not perform Intubation, CPR, defibrillation or ACLS   In the event of cardiac or respiratory ARREST Use medication by any route, position, wound care, and other measures to relive pain and suffering. May use oxygen, suction and manual treatment of airway obstruction as needed for comfort.      07/30/15 2057    Advance Directive Documentation        Most Recent Value   Type of Advance Directive  Healthcare Power of Attorney   Pre-existing out of facility DNR order (yellow form or pink MOST form)     "MOST" Form in Place?         2. Goals of Care/Additional Recommendations:   Limitations on Scope of Treatment: Full Comfort Care  Desire for further Chaplaincy support:no  Psycho-social Needs: Education on Hospice and Grief/Bereavement Support  3. Symptom Management:      1. Pain/Dyspnea: Roxanol 5 mg po/sl every  4 hrs scheduled  4. Palliative Prophylaxis:   Bowel Regimen, Frequent Pain Assessment and Oral Care  5. Prognosis: < 2 weeks, hydration and antibiotics have been stopped  6. Discharge Planning:  Hospice facility   [facility]   Care plan was discussed with Dr Mahala MenghiniSamtani Thank you for  allowing the Palliative Medicine Team to assist in the care of this patient.   Time In:  0900 Time Out:  0925 Total Time 25 min Prolonged Time Billed  no         Canary BrimMary W Larach, NP  08/03/2015, 10:00 AM  Please contact Palliative Medicine Team phone at [phone] for questions and concerns.

## 2015-08-03 NOTE — Progress Notes (Signed)
No acute changes from overnight Ready for [facility]  P[facility] Sola Margolis, MD [facility] [phone]

## 2015-08-03 NOTE — Progress Notes (Signed)
[facility].  Pt / family are in agreement with this plan. Pt required [facility] transport. Medical necessity form completed. D/C summary reviewed by hospice nsg prior to d/c.  Cori RazorJamie Gennesis Hogland LCSW [phone]

## 2015-08-04 ENCOUNTER — Encounter: Payer: Self-pay | Admitting: *Deleted

## 2015-08-06 DEATH — deceased

## 2015-08-17 ENCOUNTER — Ambulatory Visit: Payer: Medicare Other | Admitting: Podiatry

## 2015-08-18 ENCOUNTER — Ambulatory Visit: Payer: Medicare Other | Admitting: Podiatry

## 2015-08-25 ENCOUNTER — Ambulatory Visit: Payer: Self-pay | Admitting: Physician Assistant

## 2015-08-25 ENCOUNTER — Ambulatory Visit: Payer: Self-pay | Admitting: Internal Medicine

## 2015-10-16 ENCOUNTER — Encounter: Payer: Self-pay | Admitting: *Deleted

## 2015-11-30 ENCOUNTER — Ambulatory Visit: Payer: Self-pay | Admitting: Internal Medicine

## 2016-03-20 IMAGING — DX DG CHEST 2V
2 series · 2 of 2 positions shown · non-contrast
Comparison: Chest radiograph 11/04/2013, 11/03/2013

CLINICAL DATA: Right flank pain after fall earlier this day.

EXAM:
CHEST  2 VIEW

[chest pa]
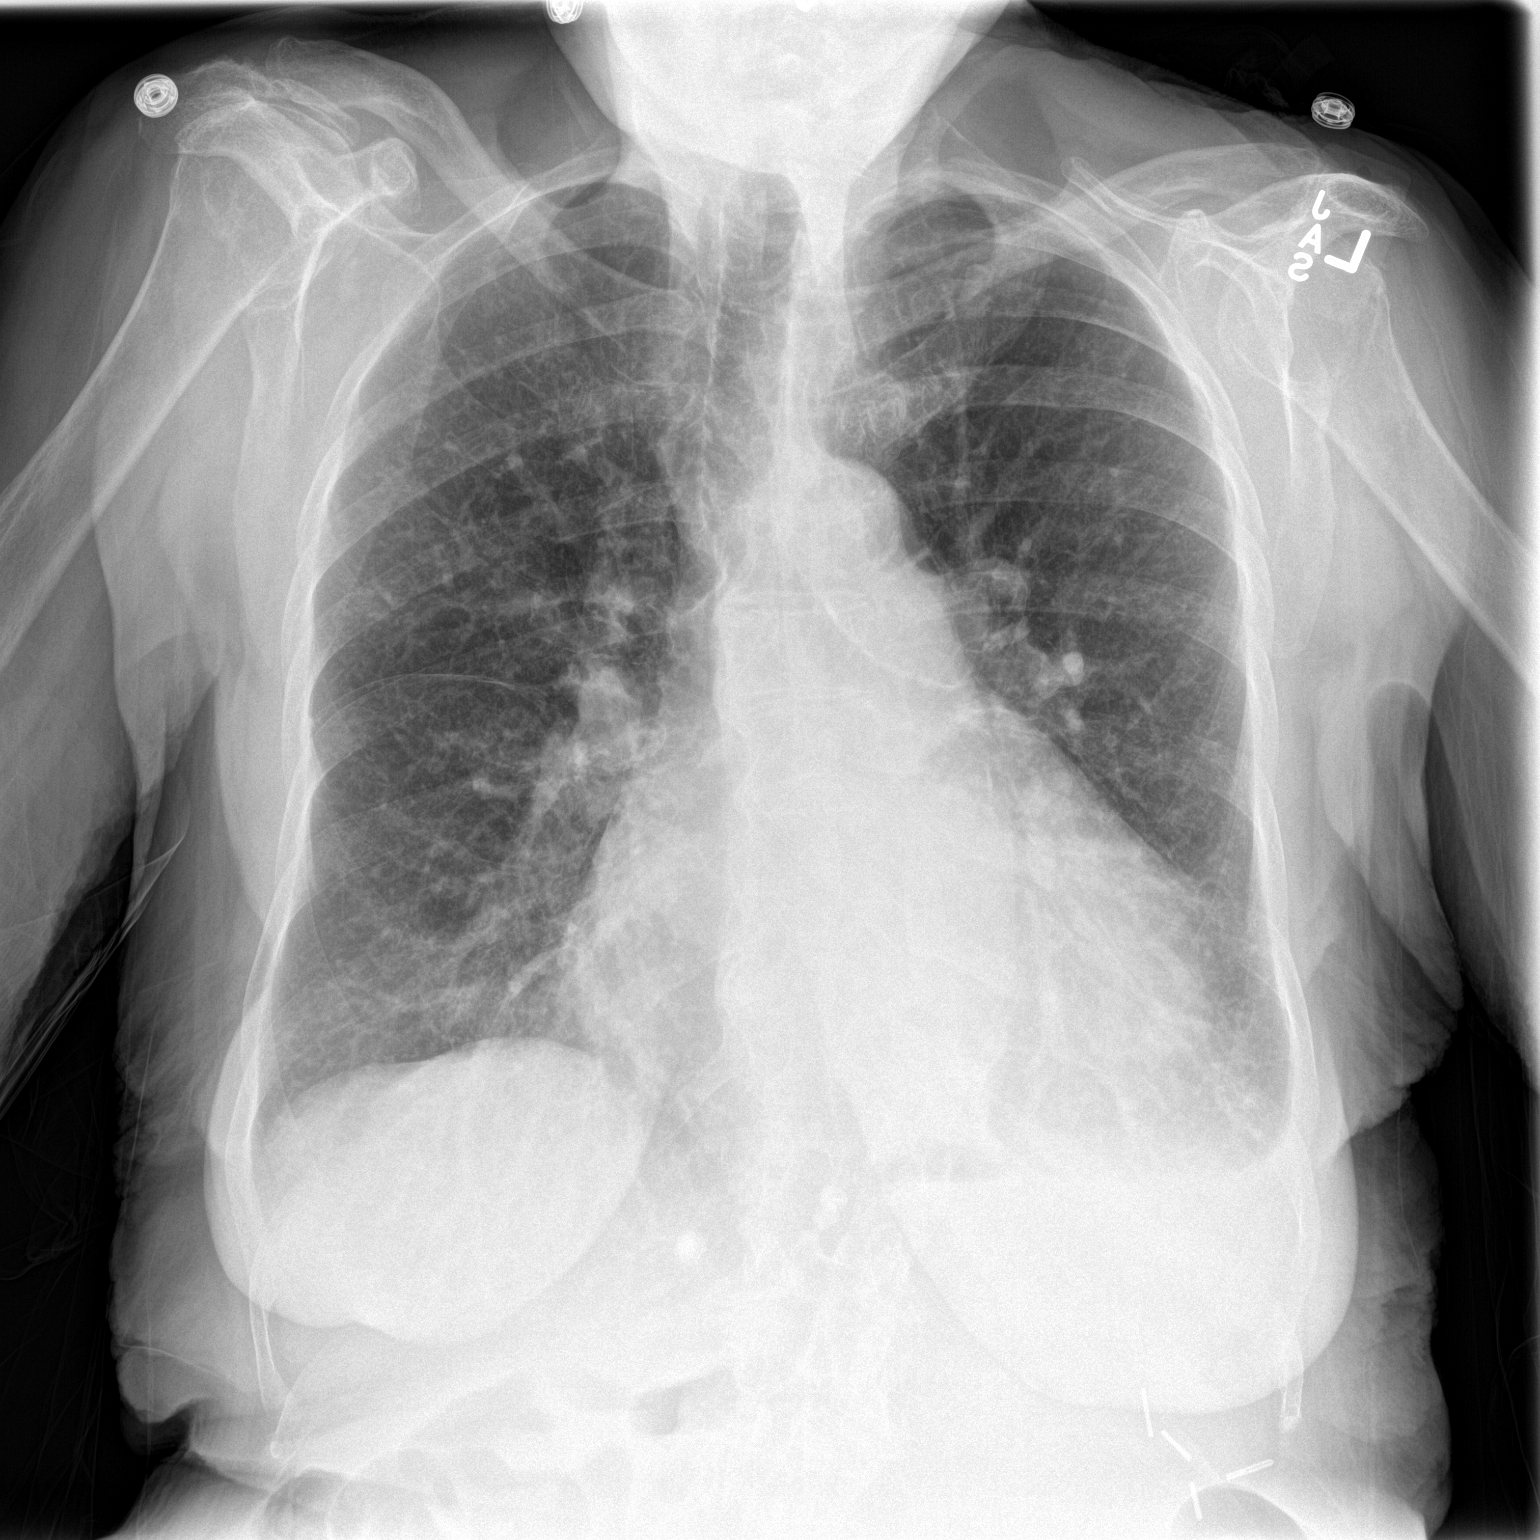

[chest lat]
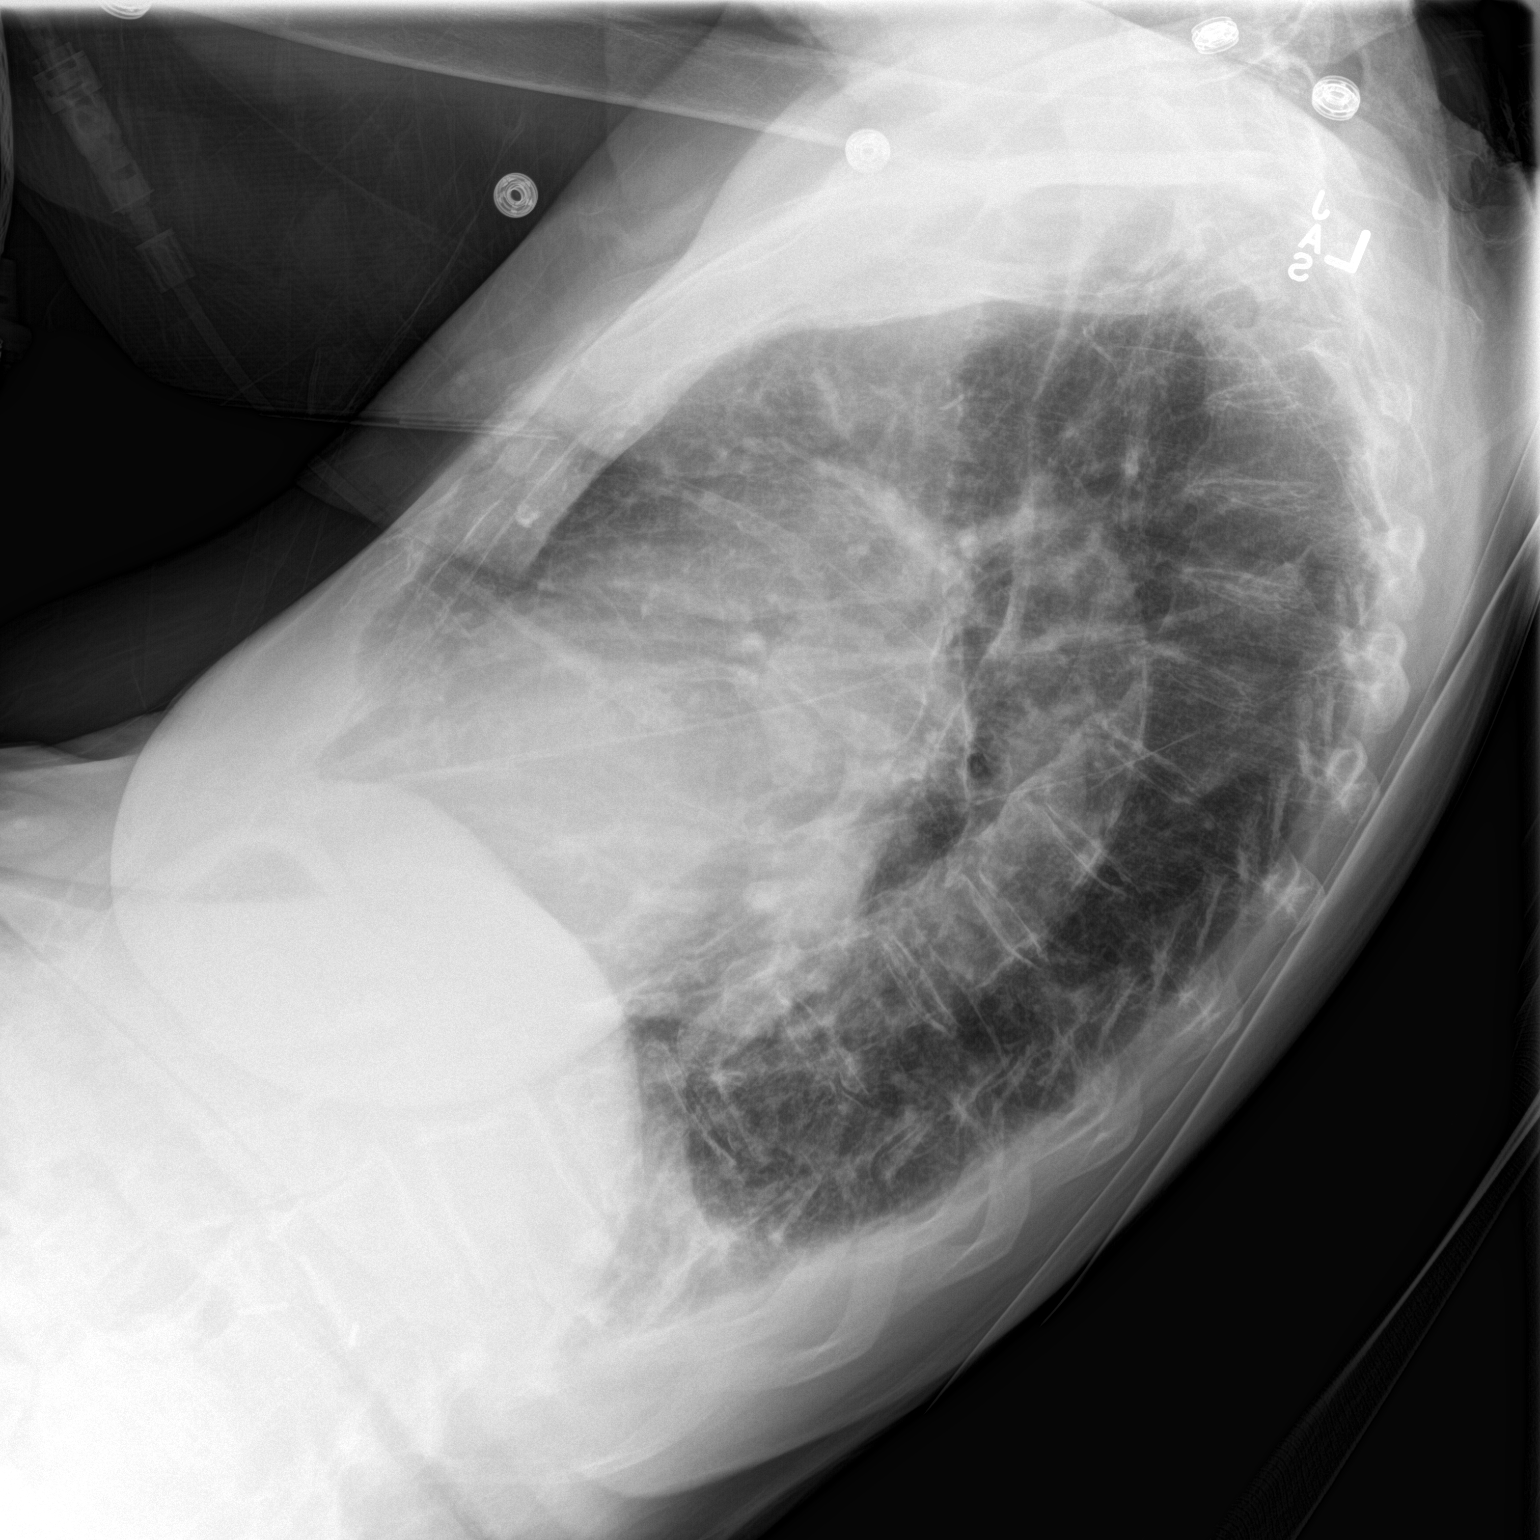

[2 of 2 positions shown; findings below may reference images not displayed]

FINDINGS: Stable to decreased cardiomegaly. Decreased consolidation at the
left lung base with minimal residual opacity and small left pleural
effusion. Diffuse chronic interstitial changes are stable. There is
no new airspace consolidation. No pneumothorax. No displaced rib
fractures seen. There is mild compression deformity of a mid
thoracic vertebral body.
IMPRESSION: 1. Small left pleural effusion and ill-defined defined opacity at
the left lung base. This is improved compared to prior exam and may
be related to residual chronic scarring and fluid.
2. Compression deformity of a midthoracic vertebral body, this is
age indeterminate, however appears new from prior exam 10 months
prior.

## 2016-06-12 ENCOUNTER — Encounter: Payer: Self-pay | Admitting: Internal Medicine

## 2016-12-21 IMAGING — DX DG SHOULDER 1V*R*
1 series · 1 of 1 positions shown · non-contrast
Comparison: None.

CLINICAL DATA: Status post fall over a week ago.

EXAM:
RIGHT SHOULDER - 1 VIEW

[shoulder ap]
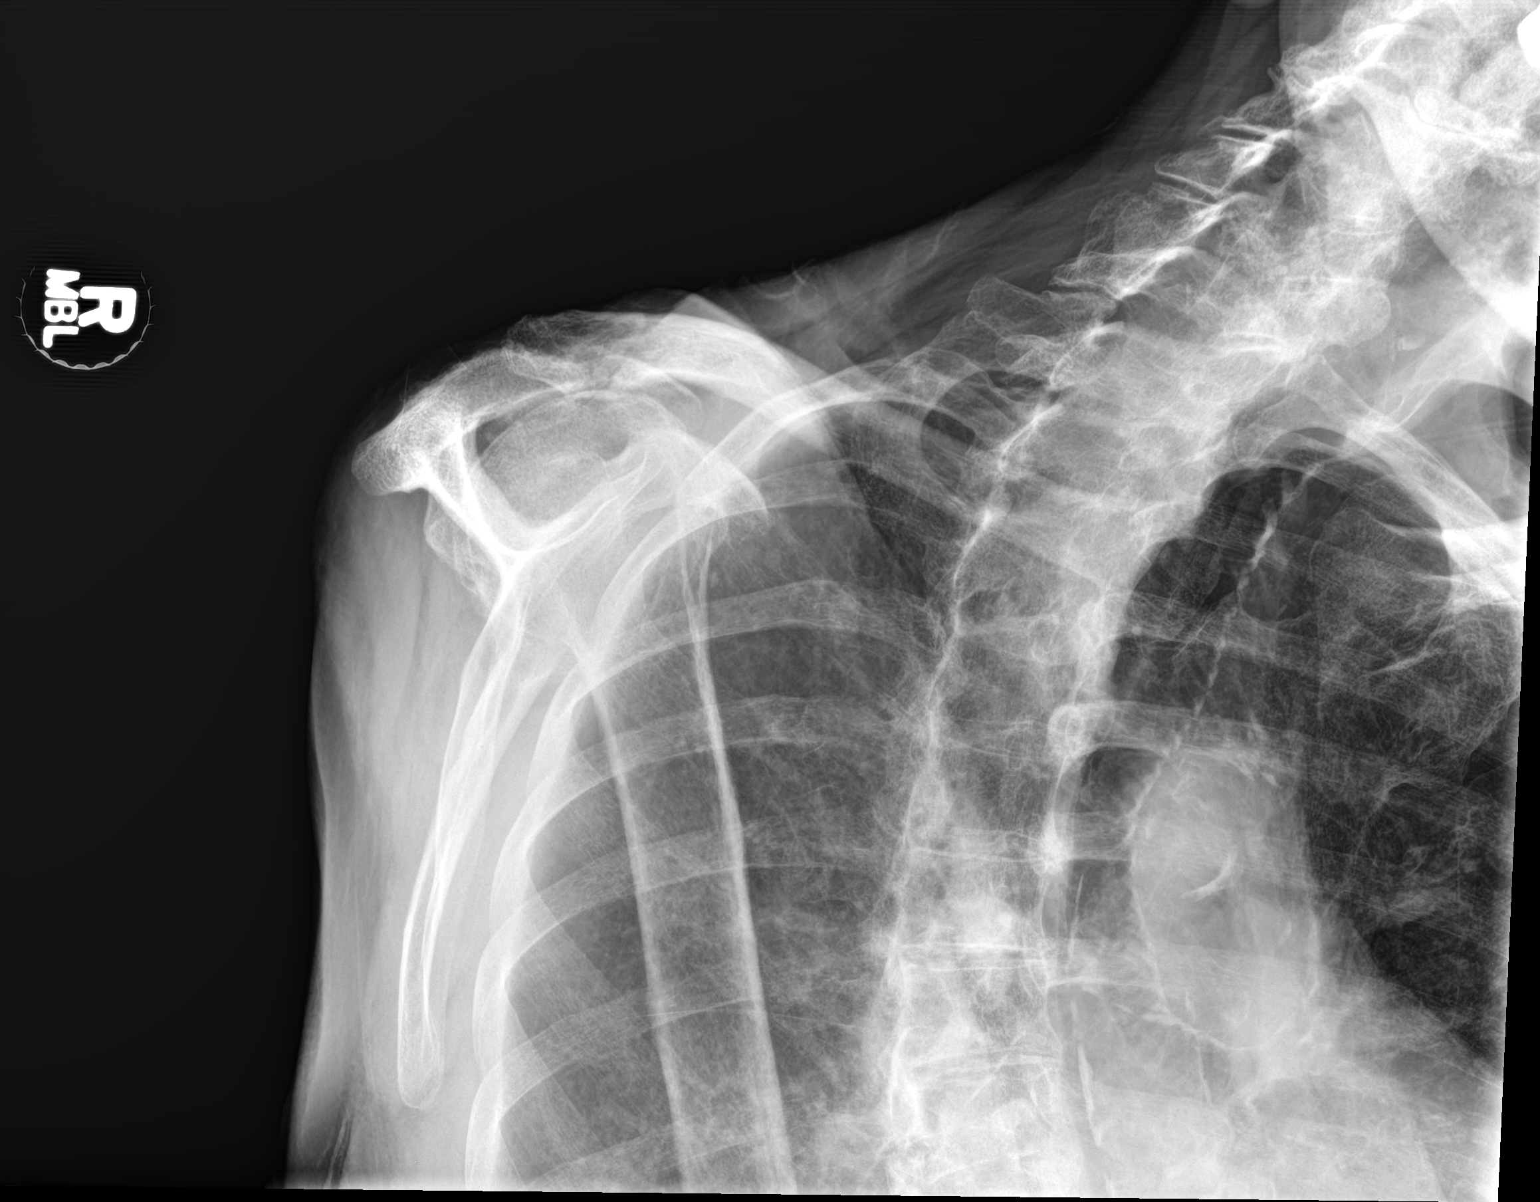

[1 of 1 positions shown; findings below may reference images not displayed]

FINDINGS: There is no fracture or dislocation. There is loss of the normal
acromiohumeral distance as can be seen with a rotator cuff tear.
There are mild degenerative changes of the acromioclavicular joint.
IMPRESSION: No acute osseous injury of the right shoulder on a single view.

## 2017-01-17 IMAGING — CT CT HEAD W/O CM
2 series · 16 of 30 positions shown, 20 images · non-contrast
Comparison: CT head without contrast 09/30/2014.

CLINICAL DATA: Decreased level of consciousness for 2 days. Altered
mental status.

EXAM:
CT HEAD WITHOUT CONTRAST
TECHNIQUE: Contiguous axial images were obtained from the base of the skull
through the vertex without intravenous contrast.

[Series 2: head w/o · axial · non-contrast · 0.45mm/px · z∈[+1380,+1505]mm · 13 of 31 slices shown, 17 images]
[im 3/31  brain]
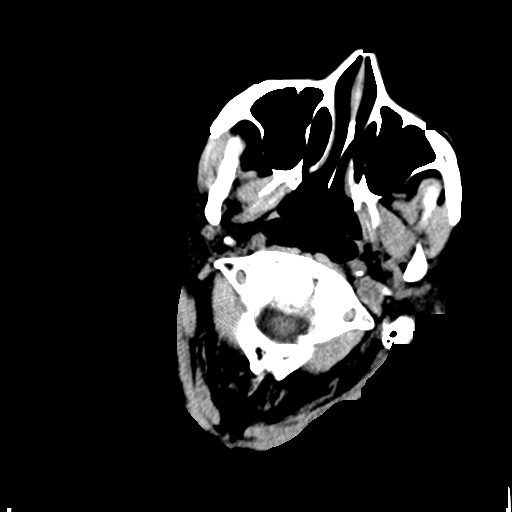
[im 3/31  bone]
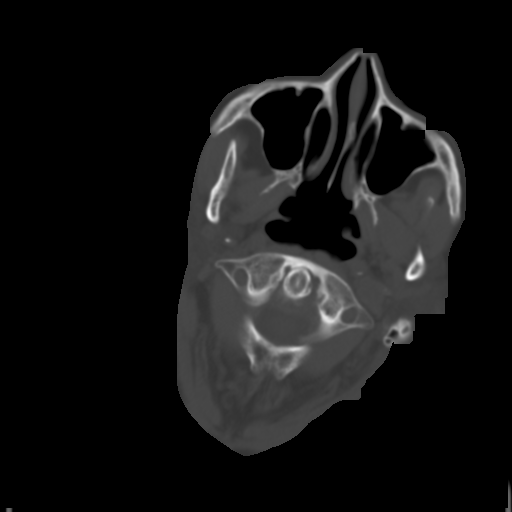
[im 5/31  brain]
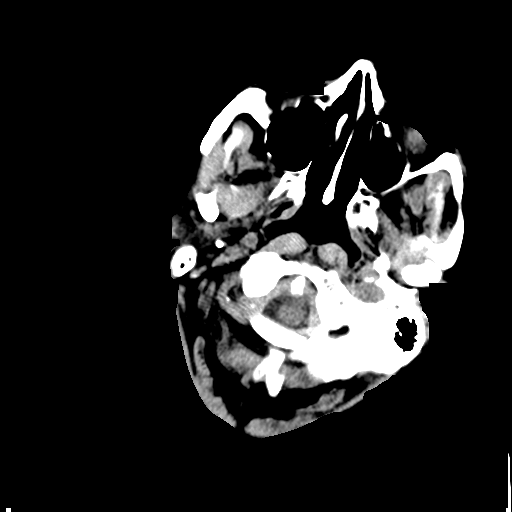
[im 7/31  brain]
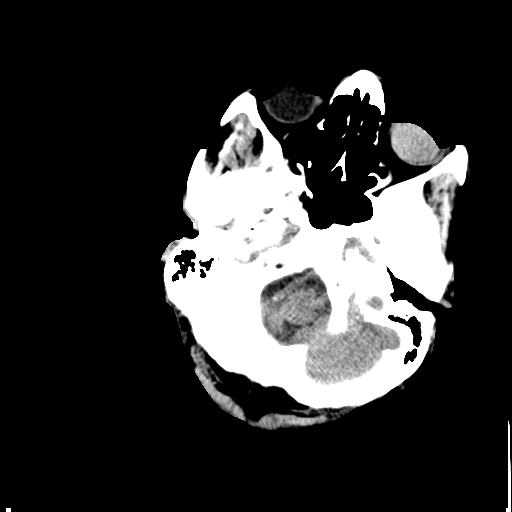
[im 9/31  brain]
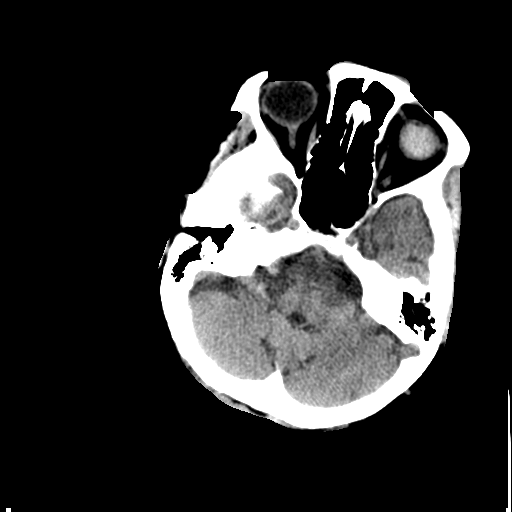
[im 11/31  brain]
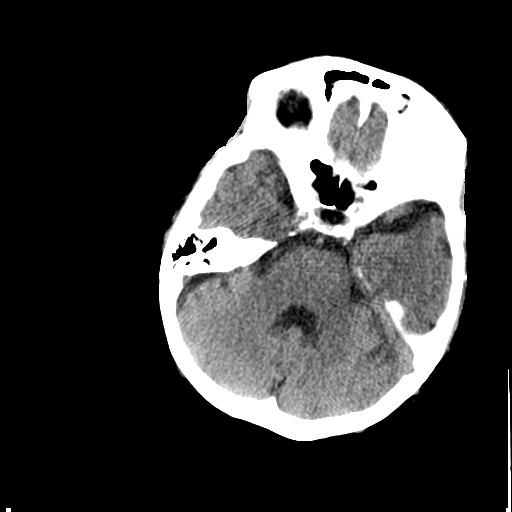
[im 11/31  bone]
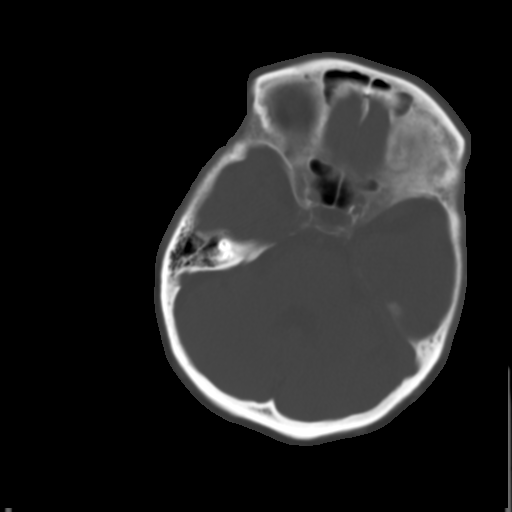
[im 13/31  brain]
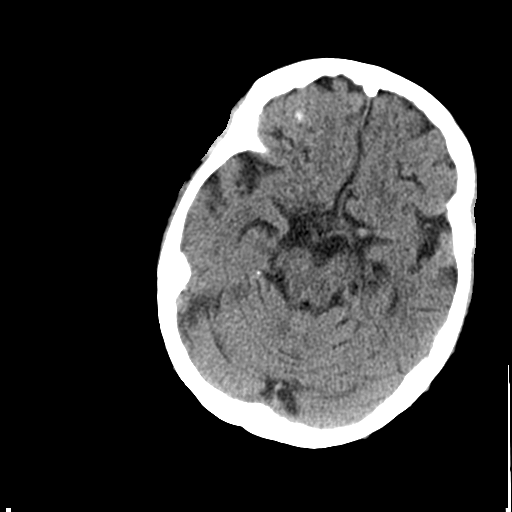
[im 16/31  brain]
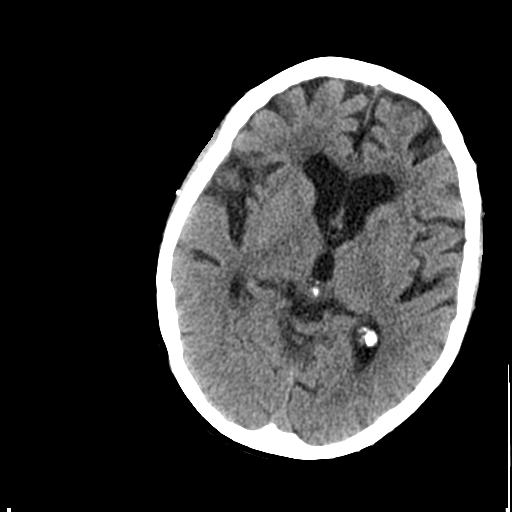
[im 18/31  brain]
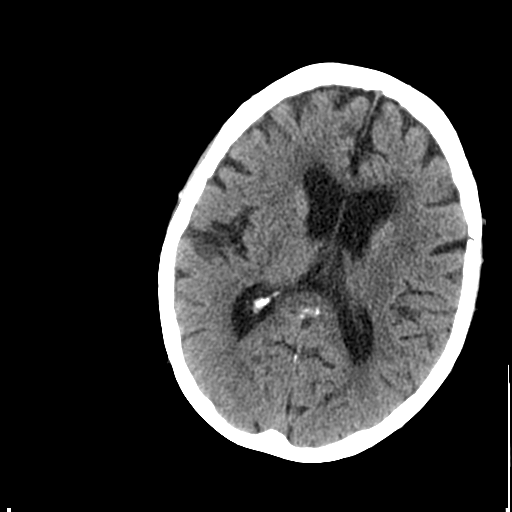
[im 20/31  brain]
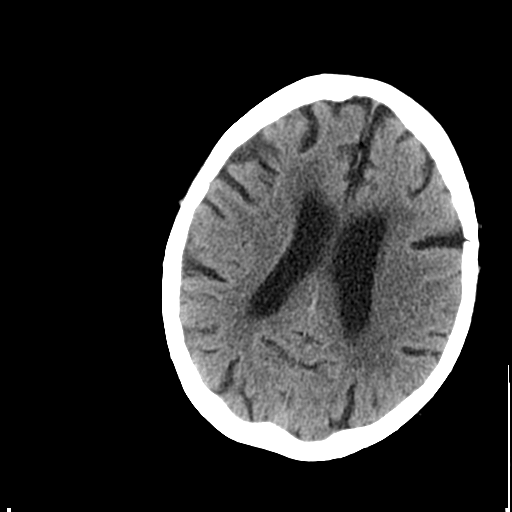
[im 20/31  bone]
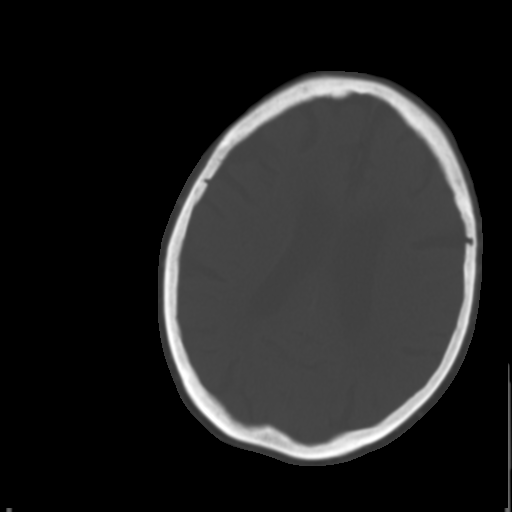
[im 22/31  brain]
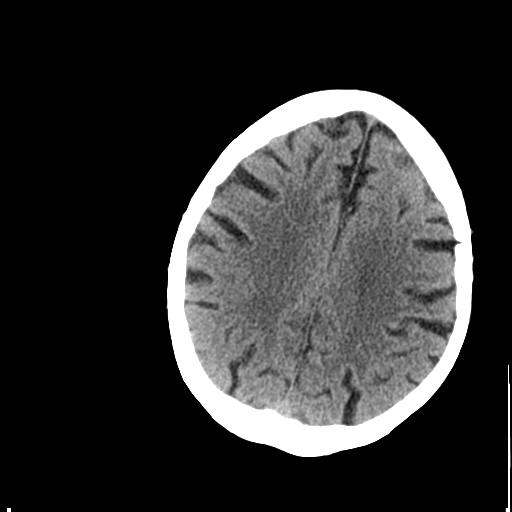
[im 24/31  brain]
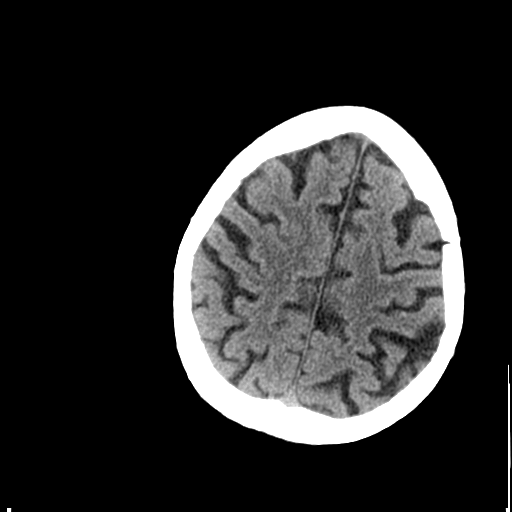
[im 26/31  brain]
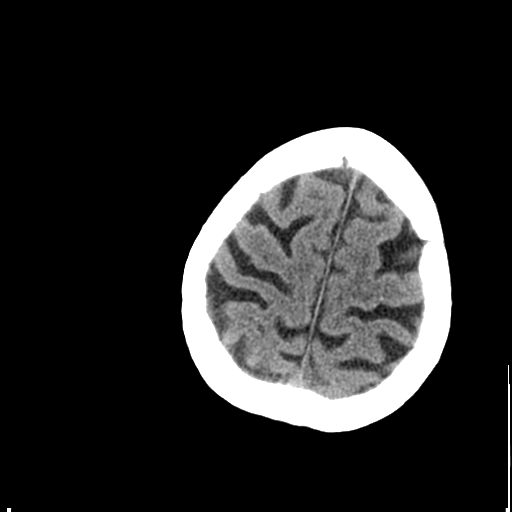
[im 28/31  brain]
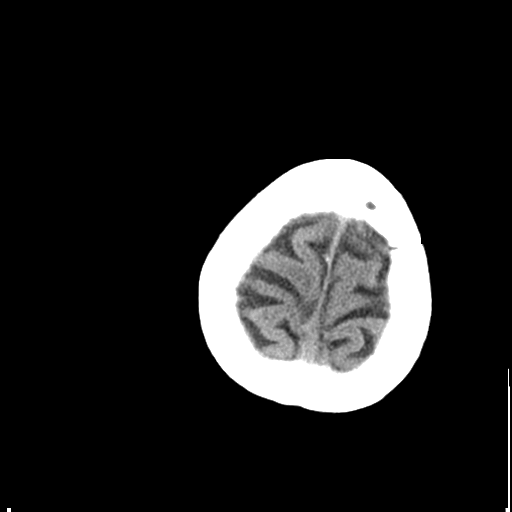
[im 28/31  bone]
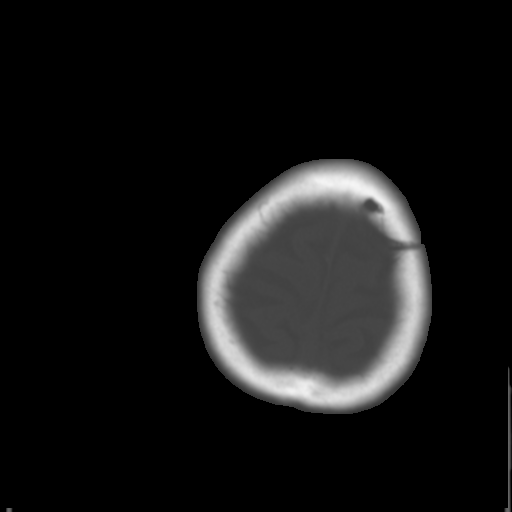

[Series 3: bone windows · axial · 0.45mm/px · z∈[+1380,+1420]mm · 3 of 31 slices shown]
[im 3/31  bone]
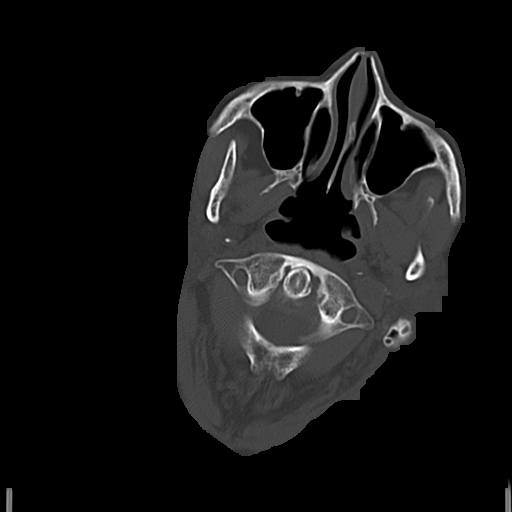
[im 7/31  bone]
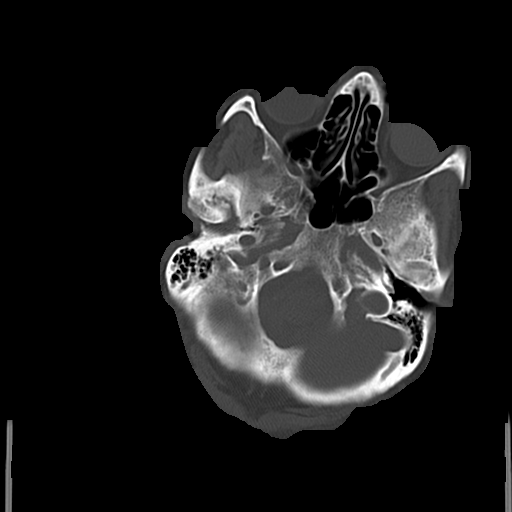
[im 11/31  bone]
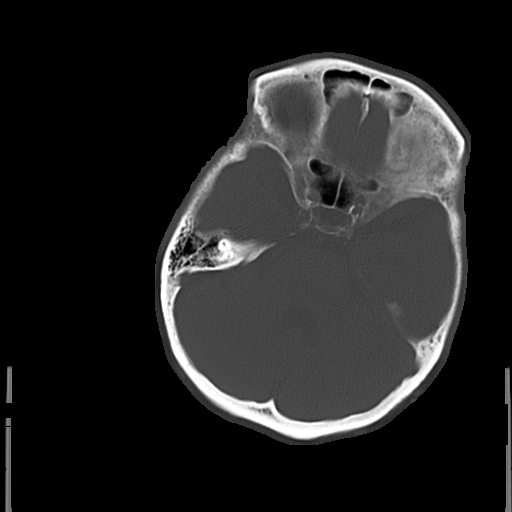

[16 of 30 positions shown; findings below may reference images not displayed]

FINDINGS: Moderate atrophy and white matter changes are stable. No acute
cortical infarct for hemorrhage is present. There is no mass lesion.
The ventricles are proportionate to the degree of atrophy. No
significant extra-axial fluid collection is present.

A remote lacunar infarct is again noted within the right cerebellum.

There is chronic opacification of the left globe. The orbits are
otherwise within normal limits.

The paranasal sinuses and mastoid air cells are clear. The calvarium
is intact.
IMPRESSION: 1. No acute intracranial abnormality or significant interval change.
2. Stable moderate atrophy and white matter disease.
